# Patient Record
Sex: Male | Born: 1970
Health system: Southern US, Community
[De-identification: ages and names within clinical notes are randomized; demographics above are authoritative.]

## PROBLEM LIST (undated history)

## (undated) DIAGNOSIS — I10 Essential (primary) hypertension: Secondary | ICD-10-CM

## (undated) DIAGNOSIS — R7303 Prediabetes: Secondary | ICD-10-CM

## (undated) DIAGNOSIS — E785 Hyperlipidemia, unspecified: Secondary | ICD-10-CM

## (undated) DIAGNOSIS — E8881 Metabolic syndrome: Secondary | ICD-10-CM

## (undated) DIAGNOSIS — E291 Testicular hypofunction: Secondary | ICD-10-CM

## (undated) DIAGNOSIS — D751 Secondary polycythemia: Secondary | ICD-10-CM

## (undated) DIAGNOSIS — E88819 Insulin resistance, unspecified: Secondary | ICD-10-CM

## (undated) HISTORY — DX: Essential (primary) hypertension: I10

## (undated) HISTORY — DX: Metabolic syndrome: E88.81

## (undated) HISTORY — DX: Prediabetes: R73.03

## (undated) HISTORY — DX: Testicular hypofunction: E29.1

## (undated) HISTORY — DX: Hyperlipidemia, unspecified: E78.5

## (undated) HISTORY — DX: Insulin resistance, unspecified: E88.819

## (undated) HISTORY — PX: SPLENECTOMY: SUR1306

---
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
## 2005-03-22 ENCOUNTER — Emergency Department (HOSPITAL_COMMUNITY): Admission: EM | Admit: 2005-03-22 | Discharge: 2005-03-23 | Payer: Self-pay | Admitting: Emergency Medicine
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
## 2013-04-25 ENCOUNTER — Encounter: Payer: Self-pay | Admitting: Internal Medicine

## 2013-04-25 ENCOUNTER — Ambulatory Visit (INDEPENDENT_AMBULATORY_CARE_PROVIDER_SITE_OTHER): Payer: BC Managed Care – PPO | Admitting: Internal Medicine

## 2013-04-25 VITALS — BP 110/72 | HR 88 | Temp 98.1°F | Resp 18 | Wt 225.8 lb

## 2013-04-25 DIAGNOSIS — R7309 Other abnormal glucose: Secondary | ICD-10-CM

## 2013-04-25 DIAGNOSIS — L719 Rosacea, unspecified: Secondary | ICD-10-CM

## 2013-04-25 DIAGNOSIS — E782 Mixed hyperlipidemia: Secondary | ICD-10-CM

## 2013-04-25 DIAGNOSIS — N529 Male erectile dysfunction, unspecified: Secondary | ICD-10-CM

## 2013-04-25 DIAGNOSIS — E8881 Metabolic syndrome: Secondary | ICD-10-CM

## 2013-04-25 DIAGNOSIS — I10 Essential (primary) hypertension: Secondary | ICD-10-CM

## 2013-04-25 DIAGNOSIS — E1169 Type 2 diabetes mellitus with other specified complication: Secondary | ICD-10-CM | POA: Insufficient documentation

## 2013-04-25 MED ORDER — DOXYCYCLINE HYCLATE 100 MG PO CAPS: 100.0000 mg | ORAL_CAPSULE | Freq: Two times a day (BID) | ORAL | Status: DC

## 2013-04-25 MED ORDER — METRONIDAZOLE 0.75 % EX GEL: 1.0000 "application " | Freq: Two times a day (BID) | CUTANEOUS | Status: AC

## 2013-04-25 NOTE — Progress Notes (Signed)
   Subjective:    Patient ID: Noah Dennis, male    DOB: [birth year], 42 y.o.   MRN: [id number]  HPI Pt c/o dry skin on face which he attributes to his testosterone therapy.     Review of Systems  Constitutional: Negative.   HENT: Negative.   Eyes: Negative.   Respiratory: Negative.   Cardiovascular: Negative.   Gastrointestinal: Negative.   Endocrine: Negative.   Genitourinary: Negative.   Musculoskeletal: Negative.   Skin: Positive for rash.       C/o rash over forehead, cheeks, chin, and neck.  Allergic/Immunologic: Negative.   Neurological: Negative.      Medication List       This list is accurate as of: 04/25/13  7:31 PM.  Always use your most recent med list.               doxycycline 100 MG capsule  Commonly known as:  VIBRAMYCIN  Take 1 capsule (100 mg total) by mouth 2 (two) times daily. With food for Rosacea     finasteride 5 MG tablet  Commonly known as:  PROSCAR  Take 5 mg by mouth daily.     lisinopril 20 MG tablet  Commonly known as:  PRINIVIL,ZESTRIL  Take 20 mg by mouth daily.     metroNIDAZOLE 0.75 % gel  Commonly known as:  METROGEL  Apply 1 application topically 2 (two) times daily.     phentermine 37.5 MG capsule  Take 37.5 mg by mouth every morning.     rosuvastatin 40 MG tablet  Commonly known as:  CRESTOR  Take 40 mg by mouth daily. Takes 1/2 tab daily     testosterone cypionate 100 MG/ML injection  Commonly known as:  DEPOTESTOTERONE CYPIONATE  Inject 200 mg into the muscle every 21 ( twenty-one) days. For IM use only     VITAMIN D PO  Take 5,000 mg by mouth 2 (two) times daily.        No Known Allergies      Objective:   Physical Exam  Constitutional: He is oriented to person, place, and time. He appears well-developed and well-nourished.  HENT:  Head: Normocephalic.  Nose: Nose normal.  Mouth/Throat: Oropharynx is clear and moist. No oropharyngeal exudate.  Eyes: EOM are normal. Pupils are equal, round, and reactive  to light.  Neck: Normal range of motion. Neck supple. No JVD present. No thyromegaly present.  Cardiovascular: Normal rate, regular rhythm and normal heart sounds.   Pulmonary/Chest: Effort normal and breath sounds normal. No respiratory distress.  Musculoskeletal: Normal range of motion.  Lymphadenopathy:    He has no cervical adenopathy.  Neurological: He is alert and oriented to person, place, and time. No cranial nerve deficit. Coordination normal.  Skin: There is erythema.  Velvety type erythematous rash over face consistant with rosacea type rash.  Psychiatric: He has a normal mood and affect.          Assessment & Plan:   1. Rosacea  - doxycycline (VIBRAMYCIN) 100 MG capsule; Take 1 capsule (100 mg total) by mouth 2 (two) times daily. With food for Rosacea  Dispense: 60 capsule; Refill: 99 - metroNIDAZOLE (METROGEL) 0.75 % gel; Apply 1 application topically 2 (two) times daily.  Dispense: 45 g; Refill: 99  2. Unspecified essential hypertension  3. Mixed hyperlipidemia  4. Other abnormal glucose

## 2013-05-04 ENCOUNTER — Encounter: Payer: Self-pay | Admitting: Internal Medicine

## 2013-05-07 DIAGNOSIS — E559 Vitamin D deficiency, unspecified: Secondary | ICD-10-CM | POA: Insufficient documentation

## 2013-05-07 NOTE — Patient Instructions (Signed)
Continue diet & medications same as discussed.   Further disposition pending lab results.    Rosacea Rosacea is a long-term (chronic) condition that affects the skin of the face (cheeks, nose, brow, and chin) and sometimes the eyes. Rosacea causes the blood vessels near the surface of the skin to enlarge, resulting in redness. This condition usually begins after age 43. It occurs most often in light-skinned women. Without treatment, rosacea tends to get worse over time. There is no cure for rosacea, but treatment can help control your symptoms. CAUSES  The cause is unknown. It is thought that some people may inherit a tendency to develop rosacea. Certain triggers can make your rosacea worse, including:  Hot baths.  Exercise.  Sunlight.  Very hot or cold temperatures.  Hot or spicy foods and drinks.  Drinking alcohol.  Stress.  Taking blood pressure medicine.  Long-term use of topical steroids on the face. SYMPTOMS   Redness of the face.  Red bumps or pimples on the face.  Red, enlarged nose (rhinophyma).  Blushing easily.  Red lines on the skin.  Irritated or burning feeling in the eyes.  Swollen eyelids. DIAGNOSIS  Your caregiver can usually tell what is wrong by asking about your symptoms and performing a physical exam. TREATMENT  Avoiding triggers is an important part of treatment. You will also need to see a skin specialist (dermatologist) who can develop a treatment plan for you. The goals of treatment are to control your condition and to improve the appearance of your skin. It may take several weeks or months of treatment before you notice an improvement in your skin. Even after your skin improves, you will likely need to continue treatment to prevent your rosacea from coming back. Treatment methods may include:  Using sunscreen or sunblock daily to protect the skin.  Antibiotic medicine, such as metronidazole, applied directly to the skin.  Antibiotics taken  by mouth. This is usually prescribed if you have eye problems from your rosacea.  Laser surgery to improve the appearance of the skin. This surgery can reduce the appearance of red lines on the skin and can remove excess tissue from the nose to reduce its size. HOME CARE INSTRUCTIONS  Avoid things that seem to trigger your flare-ups.  If you are given antibiotics, take them as directed. Finish them even if you start to feel better.  Use a gentle facial cleanser that does not contain alcohol.  You may use a mild facial moisturizer.  Use a sunscreen or sunblock with SPF 30 or greater.  Wear a green-tinted foundation powder to conceal redness, if needed. Choose cosmetics that are noncomedogenic. This means they do not block your pores.  If your eyelids are affected, apply warm compresses to the eyelids. Do this up to 4 times a day or as directed by your caregiver. SEEK MEDICAL CARE IF:  Your skin problems get worse.  You feel depressed.  You lose your appetite.  You have trouble concentrating.  You have problems with your eyes, such as redness or itching. MAKE SURE YOU:  Understand these instructions.  Will watch your condition.  Will get help right away if you are not doing well or get worse. Document Released: 05/21/2004 Document Revised: 10/13/2011 Document Reviewed: 03/24/2011 [facility] Patient Information 2014 [place], [place].  Hypertension As your heart beats, it forces blood through your arteries. This force is your blood pressure. If the pressure is too high, it is called hypertension (HTN) or high blood pressure. HTN is  dangerous because you may have it and not know it. High blood pressure may mean that your heart has to work harder to pump blood. Your arteries may be narrow or stiff. The extra work puts you at risk for heart disease, stroke, and other problems.  Blood pressure consists of two numbers, a higher number over a lower, 110/72, for example. It is stated as  "110 over 72." The ideal is below 120 for the top number (systolic) and under 80 for the bottom (diastolic). Write down your blood pressure today. You should pay close attention to your blood pressure if you have certain conditions such as:  Heart failure.  Prior heart attack.  Diabetes  Chronic kidney disease.  Prior stroke.  Multiple risk factors for heart disease. To see if you have HTN, your blood pressure should be measured while you are seated with your arm held at the level of the heart. It should be measured at least twice. A one-time elevated blood pressure reading (especially in the Emergency Department) does not mean that you need treatment. There may be conditions in which the blood pressure is different between your right and left arms. It is important to see your caregiver soon for a recheck. Most people have essential hypertension which means that there is not a specific cause. This type of high blood pressure may be lowered by changing lifestyle factors such as:  Stress.  Smoking.  Lack of exercise.  Excessive weight.  Drug/tobacco/alcohol use.  Eating less salt. Most people do not have symptoms from high blood pressure until it has caused damage to the body. Effective treatment can often prevent, delay or reduce that damage. TREATMENT  When a cause has been identified, treatment for high blood pressure is directed at the cause. There are a large number of medications to treat HTN. These fall into several categories, and your caregiver will help you select the medicines that are best for you. Medications may have side effects. You should review side effects with your caregiver. If your blood pressure stays high after you have made lifestyle changes or started on medicines,   Your medication(s) may need to be changed.  Other problems may need to be addressed.  Be certain you understand your prescriptions, and know how and when to take your medicine.  Be sure to  follow up with your caregiver within the time frame advised (usually within two weeks) to have your blood pressure rechecked and to review your medications.  If you are taking more than one medicine to lower your blood pressure, make sure you know how and at what times they should be taken. Taking two medicines at the same time can result in blood pressure that is too low. SEEK IMMEDIATE MEDICAL CARE IF:  You develop a severe headache, blurred or changing vision, or confusion.  You have unusual weakness or numbness, or a faint feeling.  You have severe chest or abdominal pain, vomiting, or breathing problems. MAKE SURE YOU:   Understand these instructions.  Will watch your condition.  Will get help right away if you are not doing well or get worse. Document Released: 04/13/2005 Document Revised: 07/06/2011 Document Reviewed: 12/02/2007 [facility] Patient Information 2014 [place], [place].  Insulin Resistance Blood sugar (glucose) levels are controlled by a hormone called insulin. Insulin is made by your pancreas. When your blood glucose goes up, insulin is released into your blood. Insulin is required for your body to function normally. However, your body can become resistant to your  own insulin or to insulin given to treat diabetes. In either case, insulin resistance can lead to serious problems. These problems include:  Type 2 diabetes.  Heart disease.  High blood pressure.  Stroke.  Polycystic ovary syndrome.  Fatty liver. CAUSES  Insulin resistance can develop for many different reasons. It is more likely to happen in people with these conditions or characteristics:  Obesity.  Inactivity.  Pregnancy.  High blood pressure.  Stress.  Steroid use.  Infection or severe illness.  Increased levels of cholesterol and triglycerides. SYMPTOMS  There are no symptoms. You may have symptoms related to the various complications of insulin resistance.  DIAGNOSIS  Several  different things can make your caregiver suspect you have insulin resistance. These include:  High blood glucose (hyperglycemia).  Abnormal cholesterol levels.  High uric acid levels.  Changes related to blood pressure.  Changes related to inflammation. Insulin resistance can be determined with blood tests. An elevated insulin level when you have not eaten might suggest resistance. Other more complicated tests are sometimes necessary. TREATMENT  Lifestyle changes are the most important treatment for insulin resistance.   If you are overweight and you have insulin resistance, you can improve your insulin sensitivity by losing weight.  Moderate exercise for 30 40 minutes, 4 days a week, can improve insulin sensitivity. Some medicines can also help improve your insulin sensitivity. Your caregiver can discuss these with you if they are appropriate.  HOME CARE INSTRUCTIONS   Do not smoke.  Keep your weight at a healthy level.  Get exercise.  If you have diabetes, follow your caregiver's directions.  If you have high blood pressure, follow your caregiver's directions.  Only take prescription medicines for pain, fever, or discomfort as directed by your caregiver. SEEK MEDICAL CARE IF:   You are diabetic and you are having problems keeping your blood glucose levels at target range.  You are having episodes of low blood glucose (hypoglycemia).  You feel you might be having side effects from your medicines.  You have symptoms of an illness that is not improving after 3 4 days.  You have a sore or wound that is not healing.  You notice a change in vision or a new problem with your vision. SEEK IMMEDIATE MEDICAL CARE IF:   Your blood glucose goes below 70, especially if you have confusion, lightheadedness, or other symptoms with it.  Your blood glucose is very high (as advised by your caregiver) twice in a row.  You pass out.  You have chest pain or trouble breathing.  You  have a sudden, severe headache.  You have sudden weakness in one arm or one leg.  You have sudden difficulty speaking or swallowing.  You develop vomiting or diarrhea that is getting worse or not improving after 1 day. Document Released: 06/02/2005 Document Revised: 10/13/2011 Document Reviewed: 09/22/2012 [facility] Patient Information 2014 [place], [place].  Cholesterol Cholesterol is a white, waxy, fat-like protein needed by your body in small amounts. The liver makes all the cholesterol you need. It is carried from the liver by the blood through the blood vessels. Deposits (plaque) may build up on blood vessel walls. This makes the arteries narrower and stiffer. Plaque increases the risk for heart attack and stroke. You cannot feel your cholesterol level even if it is very high. The only way to know is by a blood test to check your lipid (fats) levels. Once you know your cholesterol levels, you should keep a record of the test  results. Work with your caregiver to to keep your levels in the desired range. WHAT THE RESULTS MEAN:  Total cholesterol is a rough measure of all the cholesterol in your blood.  LDL is the so-called bad cholesterol. This is the type that deposits cholesterol in the walls of the arteries. You want this level to be low.  HDL is the good cholesterol because it cleans the arteries and carries the LDL away. You want this level to be high.  Triglycerides are fat that the body can either burn for energy or store. High levels are closely linked to heart disease. DESIRED LEVELS:  Total cholesterol below 200.  LDL below 100 for people at risk, below 70 for very high risk.  HDL above 50 is good, above 60 is best.  Triglycerides below 150. HOW TO LOWER YOUR CHOLESTEROL:  Diet.  Choose fish or white meat chicken and [place], roasted or baked. Limit fatty cuts of red meat, fried foods, and processed meats, such as sausage and lunch meat.  Eat lots of fresh fruits and  vegetables. Choose whole grains, beans, pasta, potatoes and cereals.  Use only small amounts of olive, corn or canola oils. Avoid butter, mayonnaise, shortening or palm kernel oils. Avoid foods with trans-fats.  Use skim/nonfat milk and low-fat/nonfat yogurt and cheeses. Avoid whole milk, cream, ice cream, egg yolks and cheeses. Healthy desserts include angel food cake, ginger snaps, animal crackers, hard candy, popsicles, and low-fat/nonfat frozen yogurt. Avoid pastries, cakes, pies and cookies.  Exercise.  A regular program helps decrease LDL and raises HDL.  Helps with weight control.  Do things that increase your activity level like gardening, walking, or taking the stairs.  Medication.  May be prescribed by your caregiver to help lowering cholesterol and the risk for heart disease.  You may need medicine even if your levels are normal if you have several risk factors. HOME CARE INSTRUCTIONS   Follow your diet and exercise programs as suggested by your caregiver.  Take medications as directed.  Have blood work done when your caregiver feels it is necessary. MAKE SURE YOU:   Understand these instructions.  Will watch your condition.  Will get help right away if you are not doing well or get worse. Document Released: 01/06/2001 Document Revised: 07/06/2011 Document Reviewed: 01/25/2013 [facility] Patient Information 2014 [place], [place].  Vitamin D Deficiency Vitamin D is an important vitamin that your body needs. Having too little of it in your body is called a deficiency. A very bad deficiency can make your bones soft and can cause a condition called rickets.  Vitamin D is important to your body for different reasons, such as:   It helps your body absorb 2 minerals called calcium and phosphorus.  It helps make your bones healthy.  It may prevent some diseases, such as diabetes and multiple sclerosis.  It helps your muscles and heart. You can get vitamin D in several  ways. It is a natural part of some foods. The vitamin is also added to some dairy products and cereals. Some people take vitamin D supplements. Also, your body makes vitamin D when you are in the sun. It changes the sun's rays into a form of the vitamin that your body can use. CAUSES   Not eating enough foods that contain vitamin D.  Not getting enough sunlight.  Having certain digestive system diseases that make it hard to absorb vitamin D. These diseases include Crohn's disease, chronic pancreatitis, and cystic fibrosis.  Having a  surgery in which part of the stomach or small intestine is removed.  Being obese. Fat cells pull vitamin D out of your blood. That means that obese people may not have enough vitamin D left in their blood and in other body tissues.  Having chronic kidney or liver disease. RISK FACTORS Risk factors are things that make you more likely to develop a vitamin D deficiency. They include:  Being older.  Not being able to get outside very much.  Living in a nursing home.  Having had broken bones.  Having weak or thin bones (osteoporosis).  Having a disease or condition that changes how your body absorbs vitamin D.  Having dark skin.  Some medicines such as seizure medicines or steroids.  Being overweight or obese. SYMPTOMS Mild cases of vitamin D deficiency may not have any symptoms. If you have a very bad case, symptoms may include:  Bone pain.  Muscle pain.  Falling often.  Broken bones caused by a minor injury, due to osteoporosis. DIAGNOSIS A blood test is the best way to tell if you have a vitamin D deficiency. TREATMENT Vitamin D deficiency can be treated in different ways. Treatment for vitamin D deficiency depends on what is causing it. Options include:  Taking vitamin D supplements.  Taking a calcium supplement. Your caregiver will suggest what dose is best for you. HOME CARE INSTRUCTIONS  Take any supplements that your caregiver  prescribes. Follow the directions carefully. Take only the suggested amount.  Have your blood tested 2 months after you start taking supplements.  Eat foods that contain vitamin D. Healthy choices include:  Fortified dairy products, cereals, or juices. Fortified means vitamin D has been added to the food. Check the label on the package to be sure.  Fatty fish like salmon or trout.  Eggs.  Oysters.  Do not use a tanning bed.  Keep your weight at a healthy level. Lose weight if you need to.  Keep all follow-up appointments. Your caregiver will need to perform blood tests to make sure your vitamin D deficiency is going away. SEEK MEDICAL CARE IF:  You have any questions about your treatment.  You continue to have symptoms of vitamin D deficiency.  You have nausea or vomiting.  You are constipated.  You feel confused.  You have severe abdominal or back pain. MAKE SURE YOU:  Understand these instructions.  Will watch your condition.  Will get help right away if you are not doing well or get worse. Document Released: 07/06/2011 Document Revised: 08/08/2012 Document Reviewed: 07/06/2011 [facility] Patient Information 2014 [place].

## 2013-05-07 NOTE — Progress Notes (Signed)
Patient ID: Noah Dennis, male   DOB: July 29, 1970, 43 y.o.   MRN: [id number]  Annual Screening Comprehensive Examination  This very nice 43 y.o.  SWM presents for complete physical.  Patient has been followed for HTN,   Prediabetes/Insulin Resistance, Hyperlipidemia, and Vitamin D Deficiency.   HTN predates since 2005. Patient's BP has been controlled at home.Today's BP: 116/70 mmHg. Patient denies any cardiac symptoms as chest pain, palpitations, shortness of breath, dizziness or ankle swelling.   Patient's hyperlipidemia treated since 2007  is controlled with diet and medications. Patient denies myalgias or other medication SE's. Last cholesterol last visit was 134, triglycerides 179, HDL 35 and LDL 63 -in Jan 2014 - all at goal.     Patient has prediabetes/insulin resistance since Jan 2014 with normal A1c 5.0%, but elevated Insulin level 133 (Nl < 28) with last A1c 5.4 % and  insulin 12 - both normal in Oct 2014. Patient denies reactive hypoglycemic symptoms, visual blurring, diabetic polys, or paresthesias. Also he has Obesity with BMI of 33+ and is in trial with phentermine to see if it assists with weight loss.   Patient also expresses concerns regarding his complexion, eg. 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   Finally, patient has history of Vitamin D Deficiency (was 40 in 2010) with last vitamin D 79 in Oct 2014.       Medication List         doxycycline 100 MG capsule  Commonly known as:  VIBRAMYCIN  Take 1 capsule (100 mg total) by mouth 2 (two) times daily. With food for Rosacea     finasteride 5 MG tablet  Commonly known as:  PROSCAR  Take 5 mg by mouth daily.     hydrocortisone 2.5 % lotion  Apply to affected area 2 times daily     lisinopril 20 MG tablet  Commonly known as:  PRINIVIL,ZESTRIL  Take 20 mg by mouth daily.     metroNIDAZOLE 0.75 % gel  Commonly known as:  METROGEL  Apply 1  application topically 2 (two) times daily.     phentermine 37.5 MG capsule  Take 37.5 mg by mouth every morning.     rosuvastatin 40 MG tablet  Commonly known as:  CRESTOR  Take 40 mg by mouth daily. Takes 1/2 tab daily     testosterone cypionate 100 MG/ML injection  Commonly known as:  DEPOTESTOTERONE CYPIONATE  Inject 200 mg into the muscle every 21 ( twenty-one) days. For IM use only     VITAMIN D PO  Take 5,000 mg by mouth 2 (two) times daily.        No Known Allergies  Past Medical History  Diagnosis Date  . Hypertension   . Hyperlipidemia   . Hypogonadism male   . Insulin resistance   . Pre-diabetes     Past Surgical History  Procedure Laterality Date  . Splenectomy      Family History  Problem Relation Age of Onset  . Diabetes Mother   . Hypertension Mother   . Heart disease Mother   . Hypertension Father   . Cancer Father     History   Social History  . Marital Status: Single    Spouse Name: N/A    Number of Children: N/A  . Years of Education: N/A   Occupational History  . Not on file.   Social History Main Topics  . Smoking  status: Never Smoker   . Smokeless tobacco: Not on file  . Alcohol Use: Yes     Comment: occasionally  . Drug Use: No  . Sexual Activity: Not on file   Other Topics Concern  . Not on file   Social History Narrative  . No narrative on file    ROS Constitutional: Denies fever, chills, weight loss/gain, headaches, insomnia, fatigue, night sweats, and change in appetite. Eyes: Denies redness, blurred vision, diplopia, discharge, itchy, watery eyes.  ENT: Denies discharge, congestion, post nasal drip, epistaxis, sore throat, earache, hearing loss, dental pain, Tinnitus, Vertigo, Sinus pain, snoring.  Cardio: Denies chest pain, palpitations, irregular heartbeat, syncope, dyspnea, diaphoresis, orthopnea, PND, claudication, edema Respiratory: denies cough, dyspnea, DOE, pleurisy, hoarseness, laryngitis, wheezing.   Gastrointestinal: Denies dysphagia, heartburn, reflux, water brash, pain, cramps, nausea, vomiting, bloating, diarrhea, constipation, hematemesis, melena, hematochezia, jaundice, hemorrhoids Genitourinary: Denies dysuria, frequency, urgency, nocturia, hesitancy, discharge, hematuria, flank pain Musculoskeletal: Denies arthralgia, myalgia, stiffness, Jt. Swelling, pain, limp, and strain/sprain. Skin: Denies puritis, rash, hives, warts, acne, eczema, changing in skin lesion Neuro: No weakness, tremor, incoordination, spasms, paresthesia, pain Psychiatric: Denies confusion, memory loss, sensory loss Endocrine: Denies change in weight, skin, hair change, nocturia, and paresthesia, diabetic polys, visual blurring, hyper / hypo glycemic episodes.  Heme/Lymph: No excessive bleeding, bruising, or elarged lymph nodes.  Filed Vitals:   05/08/13 1419  BP: 116/70  Pulse: 92  Temp: 98.2 F (36.8 C)  Resp: 16    Estimated body mass index is 33.28 kg/(m^2) as calculated from the following:   Height as of this encounter: 5' 9.25" (1.759 m).   Weight as of this encounter: 227 lb (102.967 kg).  Physical Exam General Appearance: Well nourished, in no apparent distress. Eyes: PERRLA, EOMs, conjunctiva no swelling or erythema, normal fundi and vessels. Sinuses: No frontal/maxillary tenderness ENT/Mouth: EACs patent / TMs  nl. Nares clear without erythema, swelling, mucoid exudates. Oral hygiene is good. No erythema, swelling, or exudate. Tongue normal, non-obstructing. Tonsils not swollen or erythematous. Hearing normal.  Neck: Supple, thyroid normal. No bruits, nodes or JVD. Respiratory: Respiratory effort normal.  BS equal and clear bilateral without rales, rhonci, wheezing or stridor. Cardio: Heart sounds are normal with regular rate and rhythm and no murmurs, rubs or gallops. Peripheral pulses are normal and equal bilaterally without edema. No aortic or femoral bruits. Chest: symmetric with normal  excursions and percussion.  Abdomen: Flat, soft, with bowl sounds. Nontender, no guarding, rebound, hernias, masses, or organomegaly.  Lymphatics: Non tender without lymphadenopathy.  Genitourinary: No hernias.Testes nl. DRE - prostate nl for age - smooth & firm w/o nodules. Musculoskeletal: Full ROM all peripheral extremities, joint stability, 5/5 strength, and normal gait. Skin: Warm and dry without rashes, lesions, cyanosis, clubbing or  ecchymosis. Moderate classic rosacea facial erythema. Neuro: Cranial nerves intact, reflexes equal bilaterally. Normal muscle tone, no cerebellar symptoms. Sensation intact.  Pysch: Awake and oriented X 3, normal affect, insight and judgment appropriate.   Assessment and Plan  1. Annual Screening Examination 2. Hypertension  3. Hyperlipidemia 4. Pre Diabetes/Insulin Resistance 5. Vitamin D Deficiency 6. Obesity 7. Rosacea will continue Doxycycline, Metrogel & give injection of Dexamethasone and Rx Hydrocortisone 2.5% lotion   Continue prudent diet as discussed, weight control, BP monitoring, regular exercise, and medications as discussed.  Discussed med effects and SE's. Routine screening labs and tests as requested with regular follow-up as recommended.

## 2013-05-08 ENCOUNTER — Encounter: Payer: Self-pay | Admitting: Internal Medicine

## 2013-05-08 ENCOUNTER — Ambulatory Visit (INDEPENDENT_AMBULATORY_CARE_PROVIDER_SITE_OTHER): Payer: BC Managed Care – PPO | Admitting: Internal Medicine

## 2013-05-08 VITALS — BP 116/70 | HR 92 | Temp 98.2°F | Resp 16 | Ht 69.25 in | Wt 227.0 lb

## 2013-05-08 DIAGNOSIS — I10 Essential (primary) hypertension: Secondary | ICD-10-CM

## 2013-05-08 DIAGNOSIS — Z113 Encounter for screening for infections with a predominantly sexual mode of transmission: Secondary | ICD-10-CM

## 2013-05-08 DIAGNOSIS — Z125 Encounter for screening for malignant neoplasm of prostate: Secondary | ICD-10-CM

## 2013-05-08 DIAGNOSIS — E559 Vitamin D deficiency, unspecified: Secondary | ICD-10-CM

## 2013-05-08 DIAGNOSIS — L309 Dermatitis, unspecified: Secondary | ICD-10-CM

## 2013-05-08 DIAGNOSIS — Z79899 Other long term (current) drug therapy: Secondary | ICD-10-CM

## 2013-05-08 DIAGNOSIS — Z111 Encounter for screening for respiratory tuberculosis: Secondary | ICD-10-CM

## 2013-05-08 DIAGNOSIS — Z1212 Encounter for screening for malignant neoplasm of rectum: Secondary | ICD-10-CM

## 2013-05-08 DIAGNOSIS — R740 Nonspecific elevation of levels of transaminase and lactic acid dehydrogenase [LDH]: Secondary | ICD-10-CM

## 2013-05-08 DIAGNOSIS — R7402 Elevation of levels of lactic acid dehydrogenase (LDH): Secondary | ICD-10-CM

## 2013-05-08 DIAGNOSIS — Z0000 Encounter for general adult medical examination without abnormal findings: Secondary | ICD-10-CM

## 2013-05-08 DIAGNOSIS — R7401 Elevation of levels of liver transaminase levels: Secondary | ICD-10-CM

## 2013-05-08 DIAGNOSIS — E782 Mixed hyperlipidemia: Secondary | ICD-10-CM

## 2013-05-08 LAB — TSH: TSH: 2.662 u[IU]/mL (ref 0.350–4.500)

## 2013-05-08 LAB — BASIC METABOLIC PANEL WITH GFR
BUN: 10 mg/dL (ref 6–23)
CO2: 28 mEq/L (ref 19–32)
Calcium: 9.5 mg/dL (ref 8.4–10.5)
Chloride: 100 mEq/L (ref 96–112)
Creat: 1.11 mg/dL (ref 0.50–1.35)
GFR, Est African American: >89 mL/min
GFR, Est Non African American: 81 mL/min
Glucose, Bld: 86 mg/dL (ref 70–99)
Potassium: 4.3 mEq/L (ref 3.5–5.3)
Sodium: 137 mEq/L (ref 135–145)

## 2013-05-08 LAB — CBC WITH DIFFERENTIAL/PLATELET
Basophils Absolute: 0.1 10*3/uL (ref 0.0–0.1)
Basophils Relative: 1 % (ref 0–1)
Eosinophils Absolute: 0.5 10*3/uL (ref 0.0–0.7)
Eosinophils Relative: 5 % (ref 0–5)
HCT: 47.2 % (ref 39.0–52.0)
Hemoglobin: 16.9 g/dL (ref 13.0–17.0)
Lymphocytes Relative: 22 % (ref 12–46)
Lymphs Abs: 2.6 10*3/uL (ref 0.7–4.0)
MCH: 32.6 pg (ref 26.0–34.0)
MCHC: 35.8 g/dL (ref 30.0–36.0)
MCV: 90.9 fL (ref 78.0–100.0)
Monocytes Absolute: 1.8 10*3/uL — ABNORMAL HIGH (ref 0.1–1.0)
Monocytes Relative: 15 % — ABNORMAL HIGH (ref 3–12)
Neutro Abs: 6.9 10*3/uL (ref 1.7–7.7)
Neutrophils Relative %: 57 % (ref 43–77)
Platelets: 479 10*3/uL — ABNORMAL HIGH (ref 150–400)
RBC: 5.19 MIL/uL (ref 4.22–5.81)
RDW: 13.2 % (ref 11.5–15.5)
WBC: 11.9 10*3/uL — ABNORMAL HIGH (ref 4.0–10.5)

## 2013-05-08 LAB — LIPID PANEL
Cholesterol: 129 mg/dL (ref 0–200)
HDL: 35 mg/dL — ABNORMAL LOW (ref >39)
LDL Cholesterol: 46 mg/dL (ref 0–99)
Total CHOL/HDL Ratio: 3.7 Ratio
Triglycerides: 238 mg/dL — ABNORMAL HIGH (ref <150)
VLDL: 48 mg/dL — ABNORMAL HIGH (ref 0–40)

## 2013-05-08 LAB — HEPATIC FUNCTION PANEL
ALT: 30 U/L (ref 0–53)
AST: 26 U/L (ref 0–37)
Albumin: 4.2 g/dL (ref 3.5–5.2)
Alkaline Phosphatase: 60 U/L (ref 39–117)
Bilirubin, Direct: 0.1 mg/dL (ref 0.0–0.3)
Indirect Bilirubin: 0.6 mg/dL (ref 0.0–0.9)
Total Bilirubin: 0.7 mg/dL (ref 0.3–1.2)
Total Protein: 7.1 g/dL (ref 6.0–8.3)

## 2013-05-08 LAB — VITAMIN B12: Vitamin B-12: 698 pg/mL (ref 211–911)

## 2013-05-08 LAB — HEMOGLOBIN A1C
Hgb A1c MFr Bld: 5.4 % (ref <5.7)
Mean Plasma Glucose: 108 mg/dL (ref <117)

## 2013-05-08 LAB — MAGNESIUM: Magnesium: 1.6 mg/dL (ref 1.5–2.5)

## 2013-05-08 MED ORDER — HYDROCORTISONE 2.5 % EX LOTN: TOPICAL_LOTION | CUTANEOUS | Status: DC

## 2013-05-08 MED ORDER — DEXAMETHASONE SODIUM PHOSPHATE 100 MG/10ML IJ SOLN
10.0000 mg | Freq: Once | INTRAMUSCULAR | Status: AC
  Administered 2013-05-08: 10 mg via INTRAMUSCULAR

## 2013-05-09 LAB — VITAMIN D 25 HYDROXY (VIT D DEFICIENCY, FRACTURES): Vit D, 25-Hydroxy: 83 ng/mL (ref 30–89)

## 2013-05-09 LAB — HEPATITIS C ANTIBODY: HCV Ab: NEGATIVE

## 2013-05-09 LAB — INSULIN, FASTING: Insulin fasting, serum: 50 u[IU]/mL — ABNORMAL HIGH (ref 3–28)

## 2013-05-09 LAB — RPR

## 2013-05-09 LAB — URINALYSIS, MICROSCOPIC ONLY
Bacteria, UA: NONE SEEN
Casts: NONE SEEN
Squamous Epithelial / HPF: NONE SEEN

## 2013-05-09 LAB — HIV ANTIBODY (ROUTINE TESTING W REFLEX): HIV: NONREACTIVE

## 2013-05-09 LAB — HEPATITIS A ANTIBODY, TOTAL: Hep A Total Ab: NONREACTIVE

## 2013-05-09 LAB — PSA: PSA: 0.39 ng/mL (ref <=4.00)

## 2013-05-09 LAB — MICROALBUMIN / CREATININE URINE RATIO
Creatinine, Urine: 229.3 mg/dL
Microalb Creat Ratio: 3.4 mg/g (ref 0.0–30.0)
Microalb, Ur: 0.78 mg/dL (ref 0.00–1.89)

## 2013-05-09 LAB — HEPATITIS B SURFACE ANTIBODY,QUALITATIVE: Hep B S Ab: NEGATIVE

## 2013-05-09 LAB — TESTOSTERONE: Testosterone: 239 ng/dL — ABNORMAL LOW (ref 300–890)

## 2013-05-09 LAB — HEPATITIS B CORE ANTIBODY, TOTAL: Hep B Core Total Ab: NONREACTIVE

## 2013-05-10 ENCOUNTER — Other Ambulatory Visit: Payer: Self-pay | Admitting: Internal Medicine

## 2013-05-10 DIAGNOSIS — J0410 Acute tracheitis without obstruction: Secondary | ICD-10-CM

## 2013-05-10 MED ORDER — PREDNISONE 20 MG PO TABS: ORAL_TABLET | ORAL | Status: DC

## 2013-05-10 MED ORDER — AZITHROMYCIN 250 MG PO TABS: ORAL_TABLET | ORAL | Status: AC

## 2013-05-11 LAB — TB SKIN TEST
Induration: 0 mm
TB Skin Test: NEGATIVE

## 2013-05-11 LAB — HEPATITIS B E ANTIBODY: Hepatitis Be Antibody: NEGATIVE

## 2013-05-13 ENCOUNTER — Other Ambulatory Visit: Payer: Self-pay | Admitting: Internal Medicine

## 2013-05-21 ENCOUNTER — Other Ambulatory Visit: Payer: Self-pay | Admitting: Internal Medicine

## 2013-06-22 ENCOUNTER — Other Ambulatory Visit: Payer: Self-pay | Admitting: Emergency Medicine

## 2013-07-21 ENCOUNTER — Other Ambulatory Visit: Payer: Self-pay | Admitting: Internal Medicine

## 2013-08-07 ENCOUNTER — Encounter: Payer: Self-pay | Admitting: Emergency Medicine

## 2013-08-07 ENCOUNTER — Ambulatory Visit (INDEPENDENT_AMBULATORY_CARE_PROVIDER_SITE_OTHER): Payer: BC Managed Care – PPO | Admitting: Emergency Medicine

## 2013-08-07 VITALS — BP 134/88 | HR 80 | Temp 98.4°F | Resp 18 | Ht 69.5 in | Wt 228.0 lb

## 2013-08-07 DIAGNOSIS — E782 Mixed hyperlipidemia: Secondary | ICD-10-CM

## 2013-08-07 DIAGNOSIS — E291 Testicular hypofunction: Secondary | ICD-10-CM

## 2013-08-07 DIAGNOSIS — R7309 Other abnormal glucose: Secondary | ICD-10-CM

## 2013-08-07 DIAGNOSIS — I10 Essential (primary) hypertension: Secondary | ICD-10-CM

## 2013-08-07 DIAGNOSIS — E663 Overweight: Secondary | ICD-10-CM

## 2013-08-07 LAB — CBC WITH DIFFERENTIAL/PLATELET
Basophils Absolute: 0.1 10*3/uL (ref 0.0–0.1)
Basophils Relative: 1 % (ref 0–1)
Eosinophils Absolute: 0.3 10*3/uL (ref 0.0–0.7)
Eosinophils Relative: 2 % (ref 0–5)
HCT: 46.5 % (ref 39.0–52.0)
Hemoglobin: 16.9 g/dL (ref 13.0–17.0)
Lymphocytes Relative: 24 % (ref 12–46)
Lymphs Abs: 3.2 10*3/uL (ref 0.7–4.0)
MCH: 33.7 pg (ref 26.0–34.0)
MCHC: 36.3 g/dL — ABNORMAL HIGH (ref 30.0–36.0)
MCV: 92.8 fL (ref 78.0–100.0)
Monocytes Absolute: 1.5 10*3/uL — ABNORMAL HIGH (ref 0.1–1.0)
Monocytes Relative: 11 % (ref 3–12)
Neutro Abs: 8.4 10*3/uL — ABNORMAL HIGH (ref 1.7–7.7)
Neutrophils Relative %: 62 % (ref 43–77)
Platelets: 442 10*3/uL — ABNORMAL HIGH (ref 150–400)
RBC: 5.01 MIL/uL (ref 4.22–5.81)
RDW: 13.8 % (ref 11.5–15.5)
WBC: 13.5 10*3/uL — ABNORMAL HIGH (ref 4.0–10.5)

## 2013-08-07 LAB — HEMOGLOBIN A1C
Hgb A1c MFr Bld: 5.2 % (ref <5.7)
Mean Plasma Glucose: 103 mg/dL (ref <117)

## 2013-08-07 MED ORDER — TESTOSTERONE 40.5 MG/2.5GM (1.62%) TD GEL: TRANSDERMAL | Status: DC

## 2013-08-07 MED ORDER — PHENTERMINE HCL 37.5 MG PO TABS
37.5000 mg | ORAL_TABLET | Freq: Every day | ORAL | Status: DC
Start: 2013-08-07 — End: 2013-09-04

## 2013-08-07 NOTE — Patient Instructions (Signed)
We want weight loss that will last so you should lose 1-2 pounds a week.  THAT IS IT! Please pick THREE things a month to change. Once it is a habit check off the item. Then pick another three items off the list to become habits.  If you are already doing a habit on the list GREAT!  Cross that item off! o Don't drink your calories. Ie, alcohol, soda, fruit juice, and sweet tea.  o Drink more water. Drink a glass when you feel hungry or before each meal.  o Eat breakfast - Complex carb and protein (likeDannon light and fit yogurt, oatmeal, fruit, eggs, turkey bacon). o Measure your cereal.  Eat no more than one cup a day. (ie Kashi) o Eat an apple a day. o Add a vegetable a day. o Try a new vegetable a month. o Use Pam! Stop using oil or butter to cook. o Don't finish your plate or use smaller plates. o Share your dessert. o Eat sugar free Jello for dessert or frozen grapes. o Don't eat 2-3 hours before bed. o Switch to whole wheat bread, pasta, and brown rice. o Make healthier choices when you eat out. No fries! o Pick baked chicken, NOT fried. o Don't forget to SLOW DOWN when you eat. It is not going anywhere.  o Take the stairs. o Park far away in the parking lot o Lift soup cans (or weights) for 10 minutes while watching TV. o Walk at work for 10 minutes during break. o Walk outside 1 time a week with your friend, kids, dog, or significant other. o Start a walking group at church. o Walk the mall as much as you can tolerate.  o Keep a food diary. o Weigh yourself daily. o Walk for 15 minutes 3 days per week. o Cook at home more often and eat out less.  If life happens and you go back to old habits, it is okay.  Just start over. You can do it!   If you experience chest pain, get short of breath, or tired during the exercise, please stop immediately and inform your doctor.     Bad carbs also include fruit juice, alcohol, and sweet tea. These are empty calories that do not signal to  your brain that you are full.   Please remember the good carbs are still carbs which convert into sugar. So please measure them out no more than 1/2-1 cup of rice, oatmeal, pasta, and beans.  Veggies are however free foods! Pile them on.   I like lean protein at every meal such as chicken, turkey, pork chops, cottage cheese, etc. Just do not fry these meats and please center your meal around vegetable, the meats should be a side dish.   No all fruit is created equal. Please see the list below, the fruit at the bottom is higher in sugars than the fruit at the top    

## 2013-08-07 NOTE — Progress Notes (Signed)
Subjective:    Patient ID: Noah Dennis, male    DOB: 08/22/1970, 43 y.o.   MRN: [id number]  HPI Comments: 43 yo male presents for 3 month F/U for HTN, Cholesterol, Pre-Dm, D. Deficient. He is not checking BP.  He wants to switch from testosterone injections to Androgel. He fills the injections are not keeping his energy level stable. He has not exercising due to work load. He is going to start eating healthy. He had 3- 4 RX of Phentermine filled in past and wants refill of medication. He has not lost or gained any weight since last OV. His last labs WNL except Testosterone low and insulin level elevated.   Hyperlipidemia  Hypertension     Medication List       This list is accurate as of: 08/07/13  2:03 PM.  Always use your most recent med list.               doxycycline 100 MG capsule  Commonly known as:  VIBRAMYCIN  Take 1 capsule (100 mg total) by mouth 2 (two) times daily. With food for Rosacea     finasteride 5 MG tablet  Commonly known as:  PROSCAR  Take 5 mg by mouth daily.     hydrocortisone 2.5 % lotion  Apply to affected area 2 times daily     lisinopril 20 MG tablet  Commonly known as:  PRINIVIL,ZESTRIL  Take one tablet by mouth one time daily     metroNIDAZOLE 0.75 % gel  Commonly known as:  METROGEL  Apply 1 application topically 2 (two) times daily.     phentermine 37.5 MG capsule  Take 37.5 mg by mouth every morning.     rosuvastatin 40 MG tablet  Commonly known as:  CRESTOR  Take 40 mg by mouth daily. Takes 1/2 tab daily     testosterone cypionate 100 MG/ML injection  Commonly known as:  DEPOTESTOTERONE CYPIONATE  Inject 200 mg into the muscle every 21 ( twenty-one) days. For IM use only     VITAMIN D PO  Take 5,000 mg by mouth 2 (two) times daily.      No Known Allergies   Past Medical History  Diagnosis Date  . Hypertension   . Hyperlipidemia   . Hypogonadism male   . Insulin resistance   . Pre-diabetes         Review of Systems   Constitutional: Positive for fatigue.  All other systems reviewed and are negative.  BP 134/88  Pulse 80  Temp(Src) 98.4 F (36.9 C) (Temporal)  Resp 18  Ht 5' 9.5" (1.765 m)  Wt 228 lb (103.42 kg)  BMI 33.20 kg/m2     Objective:   Physical Exam  Nursing note and vitals reviewed. Constitutional: He is oriented to person, place, and time. He appears well-developed and well-nourished.  overweight  HENT:  Head: Normocephalic and atraumatic.  Right Ear: External ear normal.  Left Ear: External ear normal.  Nose: Nose normal.  Eyes: Conjunctivae and EOM are normal.  Neck: Normal range of motion. Neck supple. No JVD present. No thyromegaly present.  Cardiovascular: Normal rate, regular rhythm, normal heart sounds and intact distal pulses.   Pulmonary/Chest: Effort normal and breath sounds normal.  Abdominal: Soft. Bowel sounds are normal. He exhibits no distension and no mass. There is no tenderness. There is no rebound and no guarding.  Musculoskeletal: Normal range of motion. He exhibits no edema and no tenderness.  Lymphadenopathy:    He  has no cervical adenopathy.  Neurological: He is alert and oriented to person, place, and time. He has normal reflexes. No cranial nerve deficit. Coordination normal.  Skin: Skin is warm and dry.  Psychiatric: He has a normal mood and affect. His behavior is normal. Judgment and thought content normal.          Assessment & Plan:  1.  3 month F/U for HTN, Cholesterol, Pre-Dm, D. Deficient. Needs healthy diet, cardio QD and obtain healthy weight. Check Labs, Check BP if >130/80 call office  2. Over weight- Phentermine 37.5 1 po qd  #5 RX of 6, advised to try 1/2 tabs

## 2013-08-08 ENCOUNTER — Other Ambulatory Visit: Payer: Self-pay | Admitting: Emergency Medicine

## 2013-08-08 LAB — INSULIN, FASTING: Insulin fasting, serum: 8 u[IU]/mL (ref 3–28)

## 2013-08-08 LAB — BASIC METABOLIC PANEL WITH GFR
BUN: 8 mg/dL (ref 6–23)
CO2: 24 mEq/L (ref 19–32)
Calcium: 10 mg/dL (ref 8.4–10.5)
Chloride: 100 mEq/L (ref 96–112)
Creat: 0.93 mg/dL (ref 0.50–1.35)
GFR, Est African American: >89 mL/min
GFR, Est Non African American: >89 mL/min
Glucose, Bld: 91 mg/dL (ref 70–99)
Potassium: 4.3 mEq/L (ref 3.5–5.3)
Sodium: 136 mEq/L (ref 135–145)

## 2013-08-08 LAB — TESTOSTERONE: Testosterone: 1249 ng/dL — ABNORMAL HIGH (ref 300–890)

## 2013-08-08 LAB — LIPID PANEL
Cholesterol: 110 mg/dL (ref 0–200)
HDL: 43 mg/dL (ref >39)
LDL Cholesterol: 48 mg/dL (ref 0–99)
Total CHOL/HDL Ratio: 2.6 Ratio
Triglycerides: 94 mg/dL (ref <150)
VLDL: 19 mg/dL (ref 0–40)

## 2013-08-08 LAB — HEPATIC FUNCTION PANEL
ALT: 27 U/L (ref 0–53)
AST: 19 U/L (ref 0–37)
Albumin: 4.6 g/dL (ref 3.5–5.2)
Alkaline Phosphatase: 47 U/L (ref 39–117)
Bilirubin, Direct: 0.3 mg/dL (ref 0.0–0.3)
Indirect Bilirubin: 1.1 mg/dL (ref 0.2–1.2)
Total Bilirubin: 1.4 mg/dL — ABNORMAL HIGH (ref 0.2–1.2)
Total Protein: 7 g/dL (ref 6.0–8.3)

## 2013-08-08 MED ORDER — LEVOFLOXACIN 500 MG PO TABS: 500.0000 mg | ORAL_TABLET | Freq: Every day | ORAL | Status: AC

## 2013-08-15 ENCOUNTER — Other Ambulatory Visit: Payer: Self-pay | Admitting: Emergency Medicine

## 2013-09-04 ENCOUNTER — Other Ambulatory Visit: Payer: Self-pay | Admitting: Emergency Medicine

## 2013-09-06 ENCOUNTER — Other Ambulatory Visit (INDEPENDENT_AMBULATORY_CARE_PROVIDER_SITE_OTHER): Payer: BC Managed Care – PPO

## 2013-09-06 DIAGNOSIS — N529 Male erectile dysfunction, unspecified: Secondary | ICD-10-CM

## 2013-09-06 DIAGNOSIS — R7989 Other specified abnormal findings of blood chemistry: Secondary | ICD-10-CM

## 2013-09-06 LAB — CBC WITH DIFFERENTIAL/PLATELET
Basophils Absolute: 0.1 10*3/uL (ref 0.0–0.1)
Basophils Relative: 1 % (ref 0–1)
Eosinophils Absolute: 0.4 10*3/uL (ref 0.0–0.7)
Eosinophils Relative: 4 % (ref 0–5)
HCT: 45.1 % (ref 39.0–52.0)
Hemoglobin: 15.9 g/dL (ref 13.0–17.0)
Lymphocytes Relative: 24 % (ref 12–46)
Lymphs Abs: 2.1 10*3/uL (ref 0.7–4.0)
MCH: 32.8 pg (ref 26.0–34.0)
MCHC: 35.3 g/dL (ref 30.0–36.0)
MCV: 93 fL (ref 78.0–100.0)
Monocytes Absolute: 0.7 10*3/uL (ref 0.1–1.0)
Monocytes Relative: 8 % (ref 3–12)
Neutro Abs: 5.5 10*3/uL (ref 1.7–7.7)
Neutrophils Relative %: 63 % (ref 43–77)
Platelets: 478 10*3/uL — ABNORMAL HIGH (ref 150–400)
RBC: 4.85 MIL/uL (ref 4.22–5.81)
RDW: 12.8 % (ref 11.5–15.5)
WBC: 8.8 10*3/uL (ref 4.0–10.5)

## 2013-09-06 LAB — TESTOSTERONE: Testosterone: 220 ng/dL — ABNORMAL LOW (ref 300–890)

## 2013-10-08 ENCOUNTER — Other Ambulatory Visit: Payer: Self-pay | Admitting: Physician Assistant

## 2013-11-06 ENCOUNTER — Other Ambulatory Visit: Payer: Self-pay | Admitting: Physician Assistant

## 2013-11-08 ENCOUNTER — Encounter: Payer: Self-pay | Admitting: Internal Medicine

## 2013-11-08 ENCOUNTER — Ambulatory Visit (INDEPENDENT_AMBULATORY_CARE_PROVIDER_SITE_OTHER): Payer: BC Managed Care – PPO | Admitting: Internal Medicine

## 2013-11-08 VITALS — BP 118/68 | HR 84 | Temp 98.2°F | Resp 16 | Ht 69.5 in | Wt 232.6 lb

## 2013-11-08 DIAGNOSIS — E559 Vitamin D deficiency, unspecified: Secondary | ICD-10-CM

## 2013-11-08 DIAGNOSIS — R7309 Other abnormal glucose: Secondary | ICD-10-CM

## 2013-11-08 DIAGNOSIS — E291 Testicular hypofunction: Secondary | ICD-10-CM

## 2013-11-08 DIAGNOSIS — Z79899 Other long term (current) drug therapy: Secondary | ICD-10-CM | POA: Insufficient documentation

## 2013-11-08 DIAGNOSIS — E8881 Metabolic syndrome: Secondary | ICD-10-CM

## 2013-11-08 DIAGNOSIS — E782 Mixed hyperlipidemia: Secondary | ICD-10-CM

## 2013-11-08 DIAGNOSIS — I10 Essential (primary) hypertension: Secondary | ICD-10-CM

## 2013-11-08 LAB — CBC WITH DIFFERENTIAL/PLATELET
Basophils Absolute: 0.1 10*3/uL (ref 0.0–0.1)
Basophils Relative: 1 % (ref 0–1)
Eosinophils Absolute: 0.7 10*3/uL (ref 0.0–0.7)
Eosinophils Relative: 6 % — ABNORMAL HIGH (ref 0–5)
HCT: 43.5 % (ref 39.0–52.0)
Hemoglobin: 16.1 g/dL (ref 13.0–17.0)
Lymphocytes Relative: 27 % (ref 12–46)
Lymphs Abs: 3.3 10*3/uL (ref 0.7–4.0)
MCH: 33.1 pg (ref 26.0–34.0)
MCHC: 37 g/dL — ABNORMAL HIGH (ref 30.0–36.0)
MCV: 89.3 fL (ref 78.0–100.0)
Monocytes Absolute: 1 10*3/uL (ref 0.1–1.0)
Monocytes Relative: 8 % (ref 3–12)
Neutro Abs: 7.1 10*3/uL (ref 1.7–7.7)
Neutrophils Relative %: 58 % (ref 43–77)
Platelets: 476 10*3/uL — ABNORMAL HIGH (ref 150–400)
RBC: 4.87 MIL/uL (ref 4.22–5.81)
RDW: 13 % (ref 11.5–15.5)
WBC: 12.3 10*3/uL — ABNORMAL HIGH (ref 4.0–10.5)

## 2013-11-08 LAB — HEMOGLOBIN A1C
Hgb A1c MFr Bld: 5.4 % (ref <5.7)
Mean Plasma Glucose: 108 mg/dL (ref <117)

## 2013-11-08 MED ORDER — TESTOSTERONE 40.5 MG/2.5GM (1.62%) TD GEL: TRANSDERMAL | Status: DC

## 2013-11-08 NOTE — Patient Instructions (Signed)

## 2013-11-08 NOTE — Progress Notes (Signed)
Patient ID: Noah Dennis, male   DOB: [birth year], 43 y.o.   MRN: [id number]   This very nice 43 y.o.SWM presents for 3 month follow up with Hypertension, Hyperlipidemia, Pre-Diabetes, Testosterone and Vitamin D Deficiency.    HTN predates since 2005. BP has been controlled at home. Today's BP: 118/68 mmHg. Patient denies any cardiac type chest pain, palpitations, dyspnea/orthopnea/PND, dizziness, claudication, or dependent edema.   Hyperlipidemia is controlled with diet & meds. Patient denies myalgias or other med SE's. Last Lipids were  At goal as below. Lab Results  Component Value Date   CHOL 110 08/07/2013   HDL 43 08/07/2013   LDLCALC 48 08/07/2013   TRIG 94 08/07/2013   CHOLHDL 2.6 08/07/2013    Also, the patient has history of PreDiabetes and insulin resistance since July 2011 with normal A1c 4.8%, but elevated insulin 77 and last A1c was  5.2% in Apr 2015. Patient denies any symptoms of reactive hypoglycemia, diabetic polys, paresthesias or visual blurring.   Patient is on Testosterone Replacement with improved sense of energy and well-being. Further, Patient has history of Vitamin D Deficiency   and last vitamin D was 43 in Jan 2015. Patient supplements vitamin D without any suspected side-effects.   Medication List   doxycycline 100 MG capsule  Commonly known as:  VIBRAMYCIN  Take 1 capsule (100 mg total) by mouth 2 (two) times daily. With food for Rosacea     finasteride 5 MG tablet  Commonly known as:  PROSCAR  Take 5 mg by mouth daily.     hydrocortisone 2.5 % lotion  Apply to affected area 2 times daily     lisinopril 20 MG tablet  Commonly known as:  PRINIVIL,ZESTRIL  Take one tablet by mouth one time daily     metroNIDAZOLE 0.75 % gel  Commonly known as:  METROGEL  Apply 1 application topically 2 (two) times daily.     phentermine 37.5 MG tablet  Commonly known as:  ADIPEX-P  TAKE ONE TABLET BY MOUTH EVERY MORNING     rosuvastatin 40 MG tablet  Commonly known as:   CRESTOR  Take 40 mg by mouth daily. Takes 1/2 tab daily     Testosterone 40.5 MG/2.5GM (1.62%) Gel  Commonly known as:  ANDROGEL  Dispense 1 bottle - Apply 2 pumps daily - refill x 6 months     VITAMIN D PO  Take 5,000 mg by mouth 2 (two) times daily.       No Known Allergies  PMHx:   Past Medical History  Diagnosis Date  . Hypertension   . Hyperlipidemia   . Hypogonadism male   . Insulin resistance   . Pre-diabetes    FHx:    Reviewed / unchanged  SHx:    Reviewed / unchanged  Systems Review:  Constitutional: Denies fever, chills, wt changes, headaches, insomnia, fatigue, night sweats, change in appetite. Eyes: Denies redness, blurred vision, diplopia, discharge, itchy, watery eyes.  ENT: Denies discharge, congestion, post nasal drip, epistaxis, sore throat, earache, hearing loss, dental pain, tinnitus, vertigo, sinus pain, snoring.  CV: Denies chest pain, palpitations, irregular heartbeat, syncope, dyspnea, diaphoresis, orthopnea, PND, claudication or edema. Respiratory: denies cough, dyspnea, DOE, pleurisy, hoarseness, laryngitis, wheezing.  Gastrointestinal: Denies dysphagia, odynophagia, heartburn, reflux, water brash, abdominal pain or cramps, nausea, vomiting, bloating, diarrhea, constipation, hematemesis, melena, hematochezia  or hemorrhoids. Genitourinary: Denies dysuria, frequency, urgency, nocturia, hesitancy, discharge, hematuria or flank pain. Musculoskeletal: Denies arthralgias, myalgias, stiffness, jt. swelling, pain, limping or strain/sprain.  Skin: Denies pruritus, rash, hives, warts, acne, eczema or change in skin lesion(s). Neuro: No weakness, tremor, incoordination, spasms, paresthesia or pain. Psychiatric: Denies confusion, memory loss or sensory loss. Endo: Denies change in weight, skin or hair change.  Heme/Lymph: No excessive bleeding, bruising or enlarged lymph nodes.  Exam:  BP 118/68  Pulse 84  Temp(Src) 98.2 F (36.8 C) (Temporal)  Resp 16   Ht 5' 9.5" (1.765 m)  Wt 232 lb 9.6 oz (105.507 kg)  BMI 33.87 kg/m2  Appears well nourished and in no distress. Eyes: PERRLA, EOMs, conjunctiva no swelling or erythema. Sinuses: No frontal/maxillary tenderness ENT/Mouth: EAC's clear, TM's nl w/o erythema, bulging. Nares clear w/o erythema, swelling, exudates. Oropharynx clear without erythema or exudates. Oral hygiene is good. Tongue normal, non obstructing. Hearing intact.  Neck: Supple. Thyroid nl. Car 2+/2+ without bruits, nodes or JVD. Chest: Respirations nl with BS clear & equal w/o rales, rhonchi, wheezing or stridor.  Cor: Heart sounds normal w/ regular rate and rhythm without sig. murmurs, gallops, clicks, or rubs. Peripheral pulses normal and equal  without edema.  Lymphatics: Unremarkable.  Musculoskeletal: Full ROM all peripheral extremities, joint stability, 5/5 strength, and normal gait.  Skin: Warm, dry without exposed rashes, lesions or ecchymosis apparent.  Neuro: Cranial nerves intact, reflexes equal bilaterally. Sensory-motor testing grossly intact. Tendon reflexes grossly intact.  Pysch: Alert & oriented x 3. Insight and judgement nl & appropriate. No ideations.  Assessment and Plan:  1. Hypertension - Continue monitor blood pressure at home. Continue diet/meds same.  2. Hyperlipidemia - Continue diet/meds, exercise,& lifestyle modifications. Continue monitor periodic cholesterol/liver & renal functions   3. Pre-Diabetes/Insulin Resistance - Continue diet, exercise, lifestyle modifications. Monitor appropriate labs.  4. Vitamin D Deficiency - Continue supplementation.  5. Testosterone Deficiency- monitor levels  Recommended regular exercise, BP monitoring, weight control, and discussed med and SE's. Recommended labs to assess and monitor clinical status. Further disposition pending results of labs.

## 2013-11-09 LAB — LIPID PANEL
Cholesterol: 103 mg/dL (ref 0–200)
HDL: 38 mg/dL — ABNORMAL LOW (ref >39)
LDL Cholesterol: 46 mg/dL (ref 0–99)
Total CHOL/HDL Ratio: 2.7 Ratio
Triglycerides: 97 mg/dL (ref <150)
VLDL: 19 mg/dL (ref 0–40)

## 2013-11-09 LAB — TSH: TSH: 1.562 u[IU]/mL (ref 0.350–4.500)

## 2013-11-09 LAB — BASIC METABOLIC PANEL WITH GFR
BUN: 10 mg/dL (ref 6–23)
CO2: 25 mEq/L (ref 19–32)
Calcium: 9.6 mg/dL (ref 8.4–10.5)
Chloride: 101 mEq/L (ref 96–112)
Creat: 1.03 mg/dL (ref 0.50–1.35)
GFR, Est African American: >89 mL/min
GFR, Est Non African American: 89 mL/min
Glucose, Bld: 142 mg/dL — ABNORMAL HIGH (ref 70–99)
Potassium: 4.6 mEq/L (ref 3.5–5.3)
Sodium: 137 mEq/L (ref 135–145)

## 2013-11-09 LAB — MAGNESIUM: Magnesium: 1.5 mg/dL (ref 1.5–2.5)

## 2013-11-09 LAB — HEPATIC FUNCTION PANEL
ALT: 33 U/L (ref 0–53)
AST: 23 U/L (ref 0–37)
Albumin: 4.3 g/dL (ref 3.5–5.2)
Alkaline Phosphatase: 46 U/L (ref 39–117)
Bilirubin, Direct: 0.2 mg/dL (ref 0.0–0.3)
Indirect Bilirubin: 0.9 mg/dL (ref 0.2–1.2)
Total Bilirubin: 1.1 mg/dL (ref 0.2–1.2)
Total Protein: 6.9 g/dL (ref 6.0–8.3)

## 2013-11-09 LAB — VITAMIN D 25 HYDROXY (VIT D DEFICIENCY, FRACTURES): Vit D, 25-Hydroxy: 87 ng/mL (ref 30–89)

## 2013-11-09 LAB — INSULIN, FASTING: Insulin fasting, serum: 88 u[IU]/mL — ABNORMAL HIGH (ref 3–28)

## 2013-11-09 LAB — TESTOSTERONE: Testosterone: 334 ng/dL (ref 300–890)

## 2013-12-06 ENCOUNTER — Other Ambulatory Visit: Payer: Self-pay | Admitting: Physician Assistant

## 2014-01-05 ENCOUNTER — Other Ambulatory Visit: Payer: Self-pay | Admitting: Emergency Medicine

## 2014-02-01 ENCOUNTER — Other Ambulatory Visit: Payer: Self-pay | Admitting: Internal Medicine

## 2014-02-13 ENCOUNTER — Ambulatory Visit (INDEPENDENT_AMBULATORY_CARE_PROVIDER_SITE_OTHER): Payer: BC Managed Care – PPO | Admitting: Physician Assistant

## 2014-02-13 ENCOUNTER — Encounter: Payer: Self-pay | Admitting: Physician Assistant

## 2014-02-13 VITALS — BP 110/70 | HR 88 | Temp 98.0°F | Resp 16 | Ht 69.5 in | Wt 229.0 lb

## 2014-02-13 DIAGNOSIS — I10 Essential (primary) hypertension: Secondary | ICD-10-CM

## 2014-02-13 DIAGNOSIS — E349 Endocrine disorder, unspecified: Secondary | ICD-10-CM | POA: Insufficient documentation

## 2014-02-13 DIAGNOSIS — E782 Mixed hyperlipidemia: Secondary | ICD-10-CM

## 2014-02-13 DIAGNOSIS — Z79899 Other long term (current) drug therapy: Secondary | ICD-10-CM

## 2014-02-13 DIAGNOSIS — E66811 Obesity, class 1: Secondary | ICD-10-CM | POA: Insufficient documentation

## 2014-02-13 DIAGNOSIS — E559 Vitamin D deficiency, unspecified: Secondary | ICD-10-CM

## 2014-02-13 DIAGNOSIS — E669 Obesity, unspecified: Secondary | ICD-10-CM

## 2014-02-13 DIAGNOSIS — E291 Testicular hypofunction: Secondary | ICD-10-CM

## 2014-02-13 DIAGNOSIS — E8881 Metabolic syndrome: Secondary | ICD-10-CM

## 2014-02-13 LAB — CBC WITH DIFFERENTIAL/PLATELET
Basophils Absolute: 0.1 10*3/uL (ref 0.0–0.1)
Basophils Relative: 1 % (ref 0–1)
Eosinophils Absolute: 0.4 10*3/uL (ref 0.0–0.7)
Eosinophils Relative: 4 % (ref 0–5)
HCT: 45.7 % (ref 39.0–52.0)
Hemoglobin: 16.5 g/dL (ref 13.0–17.0)
Lymphocytes Relative: 23 % (ref 12–46)
Lymphs Abs: 2.4 10*3/uL (ref 0.7–4.0)
MCH: 33.6 pg (ref 26.0–34.0)
MCHC: 36.1 g/dL — ABNORMAL HIGH (ref 30.0–36.0)
MCV: 93.1 fL (ref 78.0–100.0)
Monocytes Absolute: 0.8 10*3/uL (ref 0.1–1.0)
Monocytes Relative: 8 % (ref 3–12)
Neutro Abs: 6.8 10*3/uL (ref 1.7–7.7)
Neutrophils Relative %: 64 % (ref 43–77)
Platelets: 426 10*3/uL — ABNORMAL HIGH (ref 150–400)
RBC: 4.91 MIL/uL (ref 4.22–5.81)
RDW: 12.7 % (ref 11.5–15.5)
WBC: 10.6 10*3/uL — ABNORMAL HIGH (ref 4.0–10.5)

## 2014-02-13 MED ORDER — TESTOSTERONE 40.5 MG/2.5GM (1.62%) TD GEL: TRANSDERMAL | Status: DC

## 2014-02-13 NOTE — Progress Notes (Signed)
Assessment and Plan:  Hypertension: Continue medication, monitor blood pressure at home. Continue DASH diet.  Reminder to go to the ER if any CP, SOB, nausea, dizziness, severe HA, changes vision/speech, left arm numbness and tingling, and jaw pain. Cholesterol: Continue diet and exercise. Check cholesterol.  Pre-diabetes-Continue diet and exercise. Check A1C Vitamin D Def- check level and continue medications.  Hypogonadism- continue replacement therapy, increase 2 pumps each arm, check testosterone levels as needed.  Obesity with co morbidities- long discussion about weight loss, diet, and exercise   Continue diet and meds as discussed. Further disposition pending results of labs.  HPI 43 y.o. male  presents for 3 month follow up with hypertension, hyperlipidemia, prediabetes and vitamin D.  His blood pressure has been controlled at home, today their BP is BP: 110/70 mmHg He does not workout because he is busy at work. He denies chest pain, shortness of breath, dizziness.  He is on cholesterol medication and denies myalgias. His cholesterol is at goal. The cholesterol last visit was:   Lab Results  Component Value Date   CHOL 103 11/08/2013   HDL 38* 11/08/2013   LDLCALC 46 11/08/2013   TRIG 97 11/08/2013   CHOLHDL 2.7 11/08/2013   He has been working on diet and exercise for prediabetes, and denies paresthesia of the feet, polydipsia, polyuria and visual disturbances. Last A1C in the office was:  Lab Results  Component Value Date   HGBA1C 5.4 11/08/2013   Patient is on Vitamin D supplement.   Lab Results  Component Value Date   VD25OH 87 11/08/2013     BMI is Body mass index is 33.34 kg/(m^2)., he is struggling with weight loss due to increase stress at work.  Wt Readings from Last 3 Encounters:  02/13/14 229 lb (103.874 kg)  11/08/13 232 lb 9.6 oz (105.507 kg)  08/07/13 228 lb (103.42 kg)   He has a history of testosterone deficiency and is on testosterone replacement, he is on  androgel. He states that the testosterone helps with his energy, libido, muscle mass but not as much as before, he is only doing 1 pump each arm. Lab Results  Component Value Date   TESTOSTERONE 334 11/08/2013   Current Medications:  Current Outpatient Prescriptions on File Prior to Visit  Medication Sig Dispense Refill  . Cholecalciferol (VITAMIN D PO) Take 5,000 mg by mouth 2 (two) times daily.      . CRESTOR 40 MG tablet TAKE ONE TABLET BY MOUTH ONE TIME DAILY FOR CHOLESTEROL   30 tablet  5  . doxycycline (VIBRAMYCIN) 100 MG capsule Take 1 capsule (100 mg total) by mouth 2 (two) times daily. With food for Rosacea  60 capsule  99  . finasteride (PROSCAR) 5 MG tablet Take 5 mg by mouth daily.      . hydrocortisone 2.5 % lotion Apply to affected area 2 times daily  118 mL  3  . lisinopril (PRINIVIL,ZESTRIL) 20 MG tablet TAKE ONE TABLET BY MOUTH ONE TIME DAILY   90 tablet  1  . metroNIDAZOLE (METROGEL) 0.75 % gel Apply 1 application topically 2 (two) times daily.  45 g  99  . phentermine (ADIPEX-P) 37.5 MG tablet TAKE ONE TABLET BY MOUTH EVERY MORNING   30 tablet  3  . Testosterone (ANDROGEL) 40.5 MG/2.5GM (1.62%) GEL Dispense 1 bottle - Apply 2 pumps daily - refill x 6 months  2.5 g  5   No current facility-administered medications on file prior to visit.  Medical History:  Past Medical History  Diagnosis Date  . Hypertension   . Hyperlipidemia   . Hypogonadism male   . Insulin resistance   . Pre-diabetes    Allergies: No Known Allergies   Review of Systems: [X]  = complains of  [ ]  = denies  General: Fatigue [ ]  Fever [ ]  Chills [ ]  Weakness [ ]   Insomnia [ ]  Eyes: Redness [ ]  Blurred vision [ ]  Diplopia [ ]   ENT: Congestion [ ]  Sinus Pain [ ]  Post Nasal Drip [ ]  Sore Throat [ ]  Earache [ ]   Cardiac: Chest pain/pressure [ ]  SOB [ ]  Orthopnea [ ]   Palpitations [ ]   Paroxysmal nocturnal dyspnea[ ]  Claudication [ ]  Edema [ ]   Pulmonary: Cough [ ]  Wheezing[ ]   SOB [ ]   Snoring [ ]    GI: Nausea [ ]  Vomiting[ ]  Dysphagia[ ]  Heartburn[ ]  Abdominal pain [ ]  Constipation [ ] ; Diarrhea [ ] ; BRBPR [ ]  Melena[ ]  GU: Hematuria[ ]  Dysuria [ ]  Nocturia[ ]  Urgency [ ]   Hesitancy [ ]  Discharge [ ]  Neuro: Headaches[ ]  Vertigo[ ]  Paresthesias[ ]  Spasm [ ]  Speech changes [ ]  Incoordination [ ]   Ortho: Arthritis [ ]  Joint pain [ ]  Muscle pain [ ]  Joint swelling [ ]  Back Pain [ ]  Skin:  Rash [ ]   Pruritis [ ]  Change in skin lesion [ ]   Psych: Depression[ ]  Anxiety[ ]  Confusion [ ]  Memory loss [ ]   Heme/Lypmh: Bleeding [ ]  Bruising [ ]  Enlarged lymph nodes [ ]   Endocrine: Visual blurring [ ]  Paresthesia [ ]  Polyuria [ ]  Polydypsea [ ]    Heat/cold intolerance [ ]  Hypoglycemia [ ]   Family history- Review and unchanged Social history- Review and unchanged Physical Exam: BP 110/70  Pulse 88  Temp(Src) 98 F (36.7 C)  Resp 16  Ht 5' 9.5" (1.765 m)  Wt 229 lb (103.874 kg)  BMI 33.34 kg/m2 Wt Readings from Last 3 Encounters:  02/13/14 229 lb (103.874 kg)  11/08/13 232 lb 9.6 oz (105.507 kg)  08/07/13 228 lb (103.42 kg)   General Appearance: Well nourished, in no apparent distress. Eyes: PERRLA, EOMs, conjunctiva no swelling or erythema Sinuses: No Frontal/maxillary tenderness ENT/Mouth: Ext aud canals clear, TMs without erythema, bulging. No erythema, swelling, or exudate on post pharynx.  Tonsils not swollen or erythematous. Hearing normal.  Neck: Supple, thyroid normal.  Respiratory: Respiratory effort normal, BS equal bilaterally without rales, rhonchi, wheezing or stridor.  Cardio: RRR with no MRGs. Brisk peripheral pulses without edema.  Abdomen: Soft, + BS.  Non tender, no guarding, rebound, hernias, masses. Lymphatics: Non tender without lymphadenopathy.  Musculoskeletal: Full ROM, 5/5 strength, normal gait.  Skin: Warm, dry without rashes, lesions, ecchymosis.  Neuro: Cranial nerves intact. Normal muscle tone, no cerebellar symptoms. Sensation intact.  Psych: Awake and  oriented X 3, normal affect, Insight and Judgment appropriate.    Quentin Mullingollier, Jocilynn Grade, PA-C 1:45 PM [facility]

## 2014-02-13 NOTE — Patient Instructions (Signed)

## 2014-02-14 ENCOUNTER — Encounter: Payer: Self-pay | Admitting: Physician Assistant

## 2014-02-14 LAB — HEPATIC FUNCTION PANEL
ALT: 40 U/L (ref 0–53)
AST: 27 U/L (ref 0–37)
Albumin: 4.6 g/dL (ref 3.5–5.2)
Alkaline Phosphatase: 52 U/L (ref 39–117)
Bilirubin, Direct: 0.3 mg/dL (ref 0.0–0.3)
Indirect Bilirubin: 1 mg/dL (ref 0.2–1.2)
Total Bilirubin: 1.3 mg/dL — ABNORMAL HIGH (ref 0.2–1.2)
Total Protein: 6.8 g/dL (ref 6.0–8.3)

## 2014-02-14 LAB — TSH: TSH: 1.701 u[IU]/mL (ref 0.350–4.500)

## 2014-02-14 LAB — LIPID PANEL
Cholesterol: 110 mg/dL (ref 0–200)
HDL: 41 mg/dL (ref >39)
LDL Cholesterol: 44 mg/dL (ref 0–99)
Total CHOL/HDL Ratio: 2.7 Ratio
Triglycerides: 123 mg/dL (ref <150)
VLDL: 25 mg/dL (ref 0–40)

## 2014-02-14 LAB — BASIC METABOLIC PANEL WITH GFR
BUN: 10 mg/dL (ref 6–23)
CO2: 30 mEq/L (ref 19–32)
Calcium: 9.9 mg/dL (ref 8.4–10.5)
Chloride: 100 mEq/L (ref 96–112)
Creat: 1.03 mg/dL (ref 0.50–1.35)
GFR, Est African American: >89 mL/min
GFR, Est Non African American: 89 mL/min
Glucose, Bld: 113 mg/dL — ABNORMAL HIGH (ref 70–99)
Potassium: 4.4 mEq/L (ref 3.5–5.3)
Sodium: 139 mEq/L (ref 135–145)

## 2014-02-14 LAB — HEMOGLOBIN A1C
Hgb A1c MFr Bld: 5.3 % (ref <5.7)
Mean Plasma Glucose: 105 mg/dL (ref <117)

## 2014-02-14 LAB — VITAMIN D 25 HYDROXY (VIT D DEFICIENCY, FRACTURES): Vit D, 25-Hydroxy: 93 ng/mL — ABNORMAL HIGH (ref 30–89)

## 2014-02-14 LAB — INSULIN, FASTING: Insulin fasting, serum: 45.2 u[IU]/mL — ABNORMAL HIGH (ref 2.0–19.6)

## 2014-02-14 LAB — MAGNESIUM: Magnesium: 1.6 mg/dL (ref 1.5–2.5)

## 2014-03-28 ENCOUNTER — Other Ambulatory Visit: Payer: Self-pay | Admitting: Internal Medicine

## 2014-03-28 MED ORDER — PHENTERMINE HCL 37.5 MG PO TABS: 37.5000 mg | ORAL_TABLET | Freq: Every morning | ORAL | Status: DC

## 2014-03-28 NOTE — Progress Notes (Signed)
Called Rx in to [facility]

## 2014-04-02 ENCOUNTER — Other Ambulatory Visit: Payer: Self-pay | Admitting: *Deleted

## 2014-04-02 MED ORDER — FINASTERIDE 5 MG PO TABS: 5.0000 mg | ORAL_TABLET | Freq: Every day | ORAL | Status: DC

## 2014-04-26 ENCOUNTER — Other Ambulatory Visit: Payer: Self-pay | Admitting: Internal Medicine

## 2014-05-09 ENCOUNTER — Other Ambulatory Visit: Payer: Self-pay | Admitting: Internal Medicine

## 2014-05-09 ENCOUNTER — Encounter: Payer: Self-pay | Admitting: Internal Medicine

## 2014-05-09 ENCOUNTER — Ambulatory Visit (INDEPENDENT_AMBULATORY_CARE_PROVIDER_SITE_OTHER): Payer: BLUE CROSS/BLUE SHIELD | Admitting: Internal Medicine

## 2014-05-09 VITALS — BP 120/80 | HR 80 | Temp 98.1°F | Resp 16 | Ht 69.5 in | Wt 229.4 lb

## 2014-05-09 DIAGNOSIS — E782 Mixed hyperlipidemia: Secondary | ICD-10-CM

## 2014-05-09 DIAGNOSIS — E559 Vitamin D deficiency, unspecified: Secondary | ICD-10-CM

## 2014-05-09 DIAGNOSIS — Z125 Encounter for screening for malignant neoplasm of prostate: Secondary | ICD-10-CM

## 2014-05-09 DIAGNOSIS — E291 Testicular hypofunction: Secondary | ICD-10-CM

## 2014-05-09 DIAGNOSIS — L719 Rosacea, unspecified: Secondary | ICD-10-CM

## 2014-05-09 DIAGNOSIS — Z23 Encounter for immunization: Secondary | ICD-10-CM

## 2014-05-09 DIAGNOSIS — E669 Obesity, unspecified: Secondary | ICD-10-CM

## 2014-05-09 DIAGNOSIS — R5383 Other fatigue: Secondary | ICD-10-CM

## 2014-05-09 DIAGNOSIS — Z79899 Other long term (current) drug therapy: Secondary | ICD-10-CM

## 2014-05-09 DIAGNOSIS — I10 Essential (primary) hypertension: Secondary | ICD-10-CM

## 2014-05-09 DIAGNOSIS — E8881 Metabolic syndrome: Secondary | ICD-10-CM

## 2014-05-09 DIAGNOSIS — Z111 Encounter for screening for respiratory tuberculosis: Secondary | ICD-10-CM

## 2014-05-09 DIAGNOSIS — Z1212 Encounter for screening for malignant neoplasm of rectum: Secondary | ICD-10-CM

## 2014-05-09 NOTE — Progress Notes (Signed)
Patient ID: Noah Dennis, male   DOB: 21-May-1970, 44 y.o.   MRN: [id number] Annual Comprehensive Examination  This very nice 44 y.o.SWM presents for complete physical.  Patient has been followed for HTN, T2_NIDDM  Prediabetes, Hyperlipidemia, and Vitamin D Deficiency.   HTN predates since 2005. Patient's BP has been controlled at home.Today's BP: 120/80 mmHg. Patient denies any cardiac symptoms as chest pain, palpitations, shortness of breath, dizziness or ankle swelling.   Patient's hyperlipidemia is controlled with diet and medications. Patient denies myalgias or other medication SE's. Last lipids were at goal - Total Chol 110; HDL 41; LDL 44; Triglycerides 123 on  02/13/2014.   Patient has Morbid Obesity (BMI 30.40) and consequent  prediabetes / Insulin Resistance with A1c 4.8% and elevated Insulin 77 in July 2011 and patient denies reactive hypoglycemic symptoms, visual blurring, diabetic polys or paresthesias. Last A1c was  5.3% on 02/13/2014.     Finally, patient has history of Vitamin D Deficiency and last vitamin D was 93 on  02/13/2014.  Medication Sig  . VITAMIN D  Take 5,000 mg by mouth 2 (two) times daily.  . CRESTOR 40 MG tablet TAKE ONE TABLET BY MOUTH ONE TIME DAILY FOR CHOLESTEROL   . Doxycycline 100 MG capsule TAKE ONE CAPSULE BY MOUTH TWICE DAILY WITH FOOD for rosacea  . finasteride 5 MG tablet Take 1 tablet (5 mg total) by mouth daily.  Marland Kitchen lisinopril  20 MG tablet TAKE ONE TABLET BY MOUTH ONE TIME DAILY   . Testosterone (ANDROGEL) 40.5 MG/2.5GM (1.62%) GEL Dispense 2 bottle - Apply 4 pumps daily - refill x 6 months  . hydrocortisone 2.5 % lotion Apply to affected area 2 times daily  . phentermine (ADIPEX-P) 37.5 MG tablet Take 1 tablet (37.5 mg total) by mouth every morning. (Patient not taking: Reported on 05/09/2014)   No Known Allergies   Past Medical History  Diagnosis Date  . Hypertension   . Hyperlipidemia   . Hypogonadism male   . Insulin resistance   . Pre-diabetes     Health Maintenance  Topic Date Due  . TETANUS/TDAP  09/15/1989  . INFLUENZA VACCINE  11/25/2013   Immunization History  Administered Date(s) Administered  . PPD Test 05/08/2013, 05/09/2014  . Pneumococcal Polysaccharide-23 01/08/2010, 05/09/2014  . Td 10/20/2004  . Tdap 05/09/2014   Past Surgical History  Procedure Laterality Date  . Splenectomy for Hereditary Spherocytosis  1975   Family History  Problem Relation Age of Onset  . Diabetes Mother   . Hypertension Mother   . Heart disease Mother   . Hypertension Father   . Cancer Father    History   Social History  . Marital Status: Single    Spouse Name: N/A    Number of Children: N/A  . Years of Education: N/A   Occupational History  . [employer] Estate manager/land agent.   Social History Main Topics  . Smoking status: Never Smoker   . Smokeless tobacco: Not on file  . Alcohol Use: Yes     Comment: occasionally  . Drug Use: No  . Sexual Activity: Not on file     ROS Constitutional: Denies fever, chills, weight loss/gain, headaches, insomnia, fatigue, night sweats or change in appetite. Eyes: Denies redness, blurred vision, diplopia, discharge, itchy or watery eyes.  ENT: Denies discharge, congestion, post nasal drip, epistaxis, sore throat, earache, hearing loss, dental pain, Tinnitus, Vertigo, Sinus pain or snoring.  Cardio: Denies chest pain, palpitations, irregular heartbeat, syncope, dyspnea, diaphoresis,  orthopnea, PND, claudication or edema Respiratory: denies cough, dyspnea, DOE, pleurisy, hoarseness, laryngitis or wheezing.  Gastrointestinal: Denies dysphagia, heartburn, reflux, water brash, pain, cramps, nausea, vomiting, bloating, diarrhea, constipation, hematemesis, melena, hematochezia, jaundice or hemorrhoids Genitourinary: Denies dysuria, frequency, urgency, nocturia, hesitancy, discharge, hematuria or flank pain Musculoskeletal: Denies arthralgia, myalgia, stiffness, Jt. Swelling, pain, limp or  strain/sprain. Denies Falls. Skin: Denies puritis, rash, hives, warts, acne, eczema or change in skin lesion Neuro: No weakness, tremor, incoordination, spasms, paresthesia or pain Psychiatric: Denies confusion, memory loss or sensory loss. Denies Depression. Endocrine: Denies change in weight, skin, hair change, nocturia, and paresthesia, diabetic polys, visual blurring or hyper / hypo glycemic episodes.  Heme/Lymph: No excessive bleeding, bruising or enlarged lymph nodes.  Physical Exam  BP 120/80   Pulse 80  Temp 98.1 F   Resp 16  Ht 5' 9.5"   Wt 229 lb 6.4 oz     BMI 33.40   General Appearance: Well nourished, in no apparent distress. Eyes: PERRLA, EOMs, conjunctiva no swelling or erythema, normal fundi and vessels. Sinuses: No frontal/maxillary tenderness ENT/Mouth: EACs patent / TMs  nl. Nares clear without erythema, swelling, mucoid exudates. Oral hygiene is good. No erythema, swelling, or exudate. Tongue normal, non-obstructing. Tonsils not swollen or erythematous. Hearing normal.  Neck: Supple, thyroid normal. No bruits, nodes or JVD. Respiratory: Respiratory effort normal.  BS equal and clear bilateral without rales, rhonci, wheezing or stridor. Cardio: Heart sounds are normal with regular rate and rhythm and no murmurs, rubs or gallops. Peripheral pulses are normal and equal bilaterally without edema. No aortic or femoral bruits. Chest: symmetric with normal excursions and percussion.  Abdomen: Flat, soft, with bowl sounds. Nontender, no guarding, rebound, hernias, masses, or organomegaly.  Lymphatics: Non tender without lymphadenopathy.  Genitourinary: Declined by patient despite recommendations & rational to screen for rectal/prostate cancer. Musculoskeletal: Full ROM all peripheral extremities, joint stability, 5/5 strength, and normal gait. Skin: Warm and dry without rashes, lesions, cyanosis, clubbing or  ecchymosis.  Neuro: Cranial nerves intact, reflexes equal  bilaterally. Normal muscle tone, no cerebellar symptoms. Sensation intact.  Pysch: Awake and oriented X 3 with normal affect, insight and judgment appropriate.   Assessment and Plan   1. Hypertension  2. Hyperlipidemia 3. Pre Diabetes ? Insulin Resistance 4. Vitamin D Deficiency 5. Morbid Obesity (BMI 33.40)   Continue prudent diet as discussed, weight control, BP monitoring, regular exercise, and medications as discussed.  Discussed med effects and SE's. Routine screening labs and tests as requested with regular follow-up as recommended.

## 2014-05-09 NOTE — Patient Instructions (Signed)

## 2014-05-10 LAB — HEPATIC FUNCTION PANEL
ALT: 40 U/L (ref 0–53)
AST: 27 U/L (ref 0–37)
Albumin: 4.2 g/dL (ref 3.5–5.2)
Alkaline Phosphatase: 55 U/L (ref 39–117)
Bilirubin, Direct: 0.2 mg/dL (ref 0.0–0.3)
Indirect Bilirubin: 0.8 mg/dL (ref 0.2–1.2)
Total Bilirubin: 1 mg/dL (ref 0.2–1.2)
Total Protein: 6.9 g/dL (ref 6.0–8.3)

## 2014-05-10 LAB — BASIC METABOLIC PANEL WITH GFR
BUN: 8 mg/dL (ref 6–23)
CO2: 27 mEq/L (ref 19–32)
Calcium: 9.8 mg/dL (ref 8.4–10.5)
Chloride: 98 mEq/L (ref 96–112)
Creat: 1.08 mg/dL (ref 0.50–1.35)
GFR, Est African American: >89 mL/min
GFR, Est Non African American: 84 mL/min
Glucose, Bld: 110 mg/dL — ABNORMAL HIGH (ref 70–99)
Potassium: 4.3 mEq/L (ref 3.5–5.3)
Sodium: 136 mEq/L (ref 135–145)

## 2014-05-10 LAB — CBC WITH DIFFERENTIAL/PLATELET
Basophils Absolute: 0.1 10*3/uL (ref 0.0–0.1)
Basophils Relative: 1 % (ref 0–1)
Eosinophils Absolute: 0.2 10*3/uL (ref 0.0–0.7)
Eosinophils Relative: 2 % (ref 0–5)
HCT: 46.5 % (ref 39.0–52.0)
Hemoglobin: 16.5 g/dL (ref 13.0–17.0)
Lymphocytes Relative: 27 % (ref 12–46)
Lymphs Abs: 2.9 10*3/uL (ref 0.7–4.0)
MCH: 33.3 pg (ref 26.0–34.0)
MCHC: 35.5 g/dL (ref 30.0–36.0)
MCV: 93.9 fL (ref 78.0–100.0)
MPV: 8.5 fL — ABNORMAL LOW (ref 8.6–12.4)
Monocytes Absolute: 1 10*3/uL (ref 0.1–1.0)
Monocytes Relative: 9 % (ref 3–12)
Neutro Abs: 6.6 10*3/uL (ref 1.7–7.7)
Neutrophils Relative %: 61 % (ref 43–77)
Platelets: 487 10*3/uL — ABNORMAL HIGH (ref 150–400)
RBC: 4.95 MIL/uL (ref 4.22–5.81)
RDW: 12.8 % (ref 11.5–15.5)
WBC: 10.8 10*3/uL — ABNORMAL HIGH (ref 4.0–10.5)

## 2014-05-10 LAB — IRON AND TIBC
%SAT: 30 % (ref 20–55)
Iron: 95 ug/dL (ref 42–165)
TIBC: 321 ug/dL (ref 215–435)
UIBC: 226 ug/dL (ref 125–400)

## 2014-05-10 LAB — LIPID PANEL
Cholesterol: 122 mg/dL (ref 0–200)
HDL: 39 mg/dL — ABNORMAL LOW (ref >39)
LDL Cholesterol: 56 mg/dL (ref 0–99)
Total CHOL/HDL Ratio: 3.1 Ratio
Triglycerides: 137 mg/dL (ref <150)
VLDL: 27 mg/dL (ref 0–40)

## 2014-05-10 LAB — HEMOGLOBIN A1C
Hgb A1c MFr Bld: 5.1 % (ref <5.7)
Mean Plasma Glucose: 100 mg/dL (ref <117)

## 2014-05-10 LAB — URINALYSIS, MICROSCOPIC ONLY
Bacteria, UA: NONE SEEN
Casts: NONE SEEN
Crystals: NONE SEEN
Squamous Epithelial / LPF: NONE SEEN

## 2014-05-10 LAB — INSULIN, FASTING: Insulin fasting, serum: 25.9 u[IU]/mL — ABNORMAL HIGH (ref 2.0–19.6)

## 2014-05-10 LAB — VITAMIN B12: Vitamin B-12: 658 pg/mL (ref 211–911)

## 2014-05-10 LAB — MAGNESIUM: Magnesium: 1.7 mg/dL (ref 1.5–2.5)

## 2014-05-10 LAB — TSH: TSH: 1.135 u[IU]/mL (ref 0.350–4.500)

## 2014-05-10 LAB — MICROALBUMIN / CREATININE URINE RATIO
Creatinine, Urine: 206.8 mg/dL
Microalb Creat Ratio: 2.9 mg/g (ref 0.0–30.0)
Microalb, Ur: 0.6 mg/dL (ref <2.0)

## 2014-05-10 LAB — VITAMIN D 25 HYDROXY (VIT D DEFICIENCY, FRACTURES): Vit D, 25-Hydroxy: 76 ng/mL (ref 30–100)

## 2014-05-14 LAB — TESTOSTERONE: Testosterone: 294 ng/dL — ABNORMAL LOW (ref 300–890)

## 2014-05-14 LAB — TB SKIN TEST
Induration: 0 mm
TB Skin Test: NEGATIVE

## 2014-06-24 ENCOUNTER — Other Ambulatory Visit: Payer: Self-pay | Admitting: Internal Medicine

## 2014-07-18 ENCOUNTER — Other Ambulatory Visit: Payer: Self-pay | Admitting: Internal Medicine

## 2014-07-18 MED ORDER — PHENTERMINE HCL 37.5 MG PO TABS: 37.5000 mg | ORAL_TABLET | Freq: Every morning | ORAL | Status: DC

## 2014-08-05 ENCOUNTER — Other Ambulatory Visit: Payer: Self-pay | Admitting: Physician Assistant

## 2014-08-10 ENCOUNTER — Other Ambulatory Visit: Payer: Self-pay | Admitting: Physician Assistant

## 2014-08-10 DIAGNOSIS — E349 Endocrine disorder, unspecified: Secondary | ICD-10-CM

## 2014-08-28 ENCOUNTER — Encounter: Payer: Self-pay | Admitting: Physician Assistant

## 2014-08-28 ENCOUNTER — Ambulatory Visit (INDEPENDENT_AMBULATORY_CARE_PROVIDER_SITE_OTHER): Payer: BLUE CROSS/BLUE SHIELD | Admitting: Physician Assistant

## 2014-08-28 VITALS — BP 120/68 | HR 80 | Temp 97.7°F | Resp 16 | Ht 69.5 in | Wt 231.0 lb

## 2014-08-28 DIAGNOSIS — E8881 Metabolic syndrome: Secondary | ICD-10-CM

## 2014-08-28 DIAGNOSIS — E669 Obesity, unspecified: Secondary | ICD-10-CM

## 2014-08-28 DIAGNOSIS — E559 Vitamin D deficiency, unspecified: Secondary | ICD-10-CM

## 2014-08-28 DIAGNOSIS — E782 Mixed hyperlipidemia: Secondary | ICD-10-CM

## 2014-08-28 DIAGNOSIS — E291 Testicular hypofunction: Secondary | ICD-10-CM

## 2014-08-28 DIAGNOSIS — I10 Essential (primary) hypertension: Secondary | ICD-10-CM

## 2014-08-28 DIAGNOSIS — Z79899 Other long term (current) drug therapy: Secondary | ICD-10-CM

## 2014-08-28 MED ORDER — PHENTERMINE HCL 37.5 MG PO TABS: 37.5000 mg | ORAL_TABLET | Freq: Every morning | ORAL | Status: DC

## 2014-08-28 NOTE — Patient Instructions (Signed)
9 Ways to Naturally Increase Testosterone Levels  1.   Lose Weight If you're overweight, shedding the excess pounds may increase your testosterone levels, according to research presented at the Endocrine Society's 2012 meeting. Overweight men are more likely to have low testosterone levels to begin with, so this is an important trick to increase your body's testosterone production when you need it most.  2.   High-Intensity Exercise like Peak [employer]itness  Short intense exercise has a proven positive effect on increasing testosterone levels and preventing its decline. That's unlike aerobics or prolonged moderate exercise, which have shown to have negative or no effect on testosterone levels. Having a whey protein meal after exercise can further enhance the satiety/testosterone-boosting impact (hunger hormones cause the opposite effect on your testosterone and libido). Here's a summary of what a typical high-intensity [employer] routine might look like: " Warm up for three minutes  " Exercise as hard and fast as you can for 30 seconds. You should feel like you couldn't possibly go on another few seconds  " Recover at a slow to moderate pace for 90 seconds  " Repeat the high intensity exercise and recovery 7 more times .  3.   Consume Plenty of Zinc The mineral zinc is important for testosterone production, and supplementing your diet for as little as six weeks has been shown to cause a marked improvement in testosterone among men with low levels.1 Likewise, research has shown that restricting dietary sources of zinc leads to a significant decrease in testosterone, while zinc supplementation increases it2 -- and even protects men from exercised-induced reductions in testosterone levels.3 It's estimated that up to 45 percent of adults over the age of 60 may have lower than recommended zinc intakes; even when dietary supplements were added in, an estimated 20-25 percent of older adults still had inadequate  zinc intakes, according to a National Health and Nutrition Examination Survey.4 Your diet is the best source of zinc; along with protein-rich foods like meats and fish, other good dietary sources of zinc include raw milk, raw cheese, beans, and yogurt or kefir made from raw milk. It can be difficult to obtain enough dietary zinc if you're a vegetarian, and also for meat-eaters as well, largely because of conventional farming methods that rely heavily on chemical fertilizers and pesticides. These chemicals deplete the soil of nutrients ... nutrients like zinc that must be absorbed by plants in order to be passed on to you. In many cases, you may further deplete the nutrients in your food by the way you prepare it. For most food, cooking it will drastically reduce its levels of nutrients like zinc . particularly over-cooking, which many people do. If you decide to use a zinc supplement, stick to a dosage of less than 40 mg a day, as this is the recommended adult upper limit. Taking too much zinc can interfere with your body's ability to absorb other minerals, especially copper, and may cause nausea as a side effect.  4.   Strength Training In addition to [employer]itness, strength training is also known to boost testosterone levels, provided you are doing so intensely enough. When strength training to boost testosterone, you'll want to increase the weight and lower your number of reps, and then focus on exercises that work a large number of muscles, such as dead lifts or squats.  You can "turbo-charge" your weight training by going slower. By slowing down your movement, you're actually turning it into a high-intensity exercise. Super Slow   movement allows your muscle, at the microscopic level, to access the maximum number of cross-bridges between the protein filaments that produce movement in the muscle.   5.   Optimize Your Vitamin D Levels Vitamin D, a steroid hormone, is essential for the healthy development  of the nucleus of the sperm cell, and helps maintain semen quality and sperm count. Vitamin D also increases levels of testosterone, which may boost libido. In one study, overweight men who were given vitamin D supplements had a significant increase in testosterone levels after one year.5   6.   Reduce Stress When you're under a lot of stress, your body releases high levels of the stress hormone cortisol. This hormone actually blocks the effects of testosterone,6 presumably because, from a biological standpoint, testosterone-associated behaviors (mating, competing, aggression) may have lowered your chances of survival in an emergency (hence, the "fight or flight" response is dominant, courtesy of cortisol).  7.   Limit or Eliminate Sugar from Your Diet Testosterone levels decrease after you eat sugar, which is likely because the sugar leads to a high insulin level, another factor leading to low testosterone.7 Based on USDA estimates, the average American consumes 12 teaspoons of sugar a day, which equates to about TWO TONS of sugar during a lifetime.  8.   Eat Healthy Fats By healthy, this means not only mon- and polyunsaturated fats, like that found in avocadoes and nuts, but also saturated, as these are essential for building testosterone. Research shows that a diet with less than 40 percent of energy as fat (and that mainly from animal sources, i.e. saturated) lead to a decrease in testosterone levels.8 My personal diet is about 60-70 percent healthy fat, and other experts agree that the ideal diet includes somewhere between 50-70 percent fat.  It's important to understand that your body requires saturated fats from animal and vegetable sources (such as meat, dairy, certain oils, and tropical plants like coconut) for optimal functioning, and if you neglect this important food group in favor of sugar, grains and other starchy carbs, your health and weight are almost guaranteed to suffer. Examples of  healthy fats you can eat more of to give your testosterone levels a boost include: Olives and Olive oil  Coconuts and coconut oil Butter made from raw grass-fed organic milk Raw nuts, such as, almonds or pecans Organic pastured egg yolks Avocados Grass-fed meats Palm oil Unheated organic nut oils   9.   Boost Your Intake of Branch Chain Amino Acids (BCAA) from Foods Like Noah Dennis suggests that BCAAs result in higher testosterone levels, particularly when taken along with resistance training.9 While BCAAs are available in supplement form, you'll find the highest concentrations of BCAAs like leucine in dairy products - especially quality cheeses and whey protein. Even when getting leucine from your natural food supply, it's often wasted or used as a building block instead of an anabolic agent. So to create the correct anabolic environment, you need to boost leucine consumption way beyond mere maintenance levels. That said, keep in mind that using leucine as a free form amino acid can be highly counterproductive as when free form amino acids are artificially administrated, they rapidly enter your circulation while disrupting insulin function, and impairing your body's glycemic control. Food-based leucine is really the ideal form that can benefit your muscles without side effects.   Before you even begin to attack a weight-loss plan, it pays to remember this: You are not fat. You have fat. Losing weight isn't  about blame or shame; it's simply another achievement to accomplish. Dieting is like any other skill-you have to buckle down and work at it. As long as you act in a smart, reasonable way, you'll ultimately get where you want to be. Here are some weight loss pearls for you.  1. It's Not a Diet. It's a Lifestyle Thinking of a diet as something you're on and suffering through only for the short term doesn't work. To shed weight and keep it off, you need to make permanent changes to the way you  eat. It's OK to indulge occasionally, of course, but if you cut calories temporarily and then revert to your old way of eating, you'll gain back the weight quicker than you can say yo-yo. Use it to lose it. Research shows that one of the best predictors of long-term weight loss is how many pounds you drop in the first month. For that reason, nutritionists often suggest being stricter for the first two weeks of your new eating strategy to build momentum. Cut out added sugar and alcohol and avoid unrefined carbs. After that, figure out how you can reincorporate them in a way that's healthy and maintainable.  2. There's a Right Way to Exercise Working out burns calories and fat and boosts your metabolism by building muscle. But those trying to lose weight are notorious for overestimating the number of calories they burn and underestimating the amount they take in. Unfortunately, your system is biologically programmed to hold on to extra pounds and that means when you start exercising, your body senses the deficit and ramps up its hunger signals. If you're not diligent, you'll eat everything you burn and then some. Use it to lose it. Cardio gets all the exercise glory, but strength and interval training are the real heroes. They help you build lean muscle, which in turn increases your metabolism and calorie-burning ability 3. Don't Overreact to Mild Hunger Some people have a hard time losing weight because of hunger anxiety. To them, being hungry is bad-something to be avoided at all costs-so they carry snacks with them and eat when they don't need to. Others eat because they're stressed out or bored. While you never want to get to the point of being ravenous (that's when bingeing is likely to happen), a hunger pang, a craving, or the fact that it's 3:00 p.m. should not send you racing for the vending machine or obsessing about the energy bar in your purse. Ideally, you should put off eating until your stomach is  growling and it's difficult to concentrate.  Use it to lose it. When you feel the urge to eat, use the HALT method. Ask yourself, Am I really hungry? Or am I angry or anxious, lonely or bored, or tired? If you're still not certain, try the apple test. If you're truly hungry, an apple should seem delicious; if it doesn't, something else is going on. Or you can try drinking water and making yourself busy, if you are still hungry try a healthy snack.  4. Not All Calories Are Created Equal The mechanics of weight loss are pretty simple: Take in fewer calories than you use for energy. But the kind of food you eat makes all the difference. Processed food that's high in saturated fat and refined starch or sugar can cause inflammation that disrupts the hormone signals that tell your brain you're full. The result: You eat a lot more.  Use it to lose it. Clean up your diet. Swap in  whole, unprocessed foods, including vegetables, lean protein, and healthy fats that will fill you up and give you the biggest nutritional bang for your calorie buck. In a few weeks, as your brain starts receiving regular hunger and fullness signals once again, you'll notice that you feel less hungry overall and naturally start cutting back on the amount you eat.  5. Protein, Produce, and Plant-Based Fats Are Your Weight-Loss Trinity Here's why eating the three Ps regularly will help you drop pounds. Protein fills you up. You need it to build lean muscle, which keeps your metabolism humming so that you can torch more fat. People in a weight-loss program who ate double the recommended daily allowance for protein (about 110 grams for a 150-pound woman) lost 70 percent of their weight from fat, while people who ate the RDA lost only about 40 percent, one study found. Produce is packed with filling fiber. "It's very difficult to consume too many calories if you're eating a lot of vegetables. Example: Three cups of broccoli is a lot of food, yet  only 93 calories. (Fruit is another story. It can be easy to overeat and can contain a lot of calories from sugar, so be sure to monitor your intake.) Plant-based fats like olive oil and those in avocados and nuts are healthy and extra satiating.  Use it to lose it. Aim to incorporate each of the three Ps into every meal and snack. People who eat protein throughout the day are able to keep weight off, according to a study in the [place] of Clinical Nutrition. In addition to meat, poultry and seafood, good sources are beans, lentils, eggs, tofu, and yogurt. As for fat, keep portion sizes in check by measuring out salad dressing, oil, and nut butters (shoot for one to two tablespoons). Finally, eat veggies or a little fruit at every meal. People who did that consumed 308 fewer calories but didn't feel any hungrier than when they didn't eat more produce.  7. How You Eat Is As Important As What You Eat In order for your brain to register that you're full, you need to focus on what you're eating. Sit down whenever you eat, preferably at a table. Turn off the TV or computer, put down your phone, and look at your food. Smell it. Chew slowly, and don't put another bite on your fork until you swallow. When women ate lunch this attentively, they consumed 30 percent less when snacking later than those who listened to an audiobook at lunchtime, according to a study in the [place] of Nutrition. 8. Weighing Yourself Really Works The scale provides the best evidence about whether your efforts are paying off. Seeing the numbers tick up or down or stagnate is motivation to keep going-or to rethink your approach. A 2015 study at [facility] found that daily weigh-ins helped people lose more weight, keep it off, and maintain that loss, even after two years. Use it to lose it. Step on the scale at the same time every day for the best results. If your weight shoots up several pounds from one weigh-in to  the next, don't freak out. Eating a lot of salt the night before or having your period is the likely culprit. The number should return to normal in a day or two. It's a steady climb that you need to do something about. 9. Too Much Stress and Too Little Sleep Are Your Enemies When you're tired and frazzled, your body cranks up the production of  cortisol, the stress hormone that can cause carb cravings. Not getting enough sleep also boosts your levels of ghrelin, a hormone associated with hunger, while suppressing leptin, a hormone that signals fullness and satiety. People on a diet who slept only five and a half hours a night for two weeks lost 55 percent less fat and were hungrier than those who slept eight and a half hours, according to a study in the [place] [employer] Journal. Use it to lose it. Prioritize sleep, aiming for seven hours or more a night, which research shows helps lower stress. And make sure you're getting quality zzz's. If a snoring spouse or a fidgety cat wakes you up frequently throughout the night, you may end up getting the equivalent of just four hours of sleep, according to a study from [facility]. Keep pets out of the bedroom, and use a white-noise app to drown out snoring. 10. You Will Hit a plateau-And You Can Bust Through It As you slim down, your body releases much less leptin, the fullness hormone.  If you're not strength training, start right now. Building muscle can raise your metabolism to help you overcome a plateau. To keep your body challenged and burning calories, incorporate new moves and more intense intervals into your workouts or add another sweat session to your weekly routine. Alternatively, cut an extra 100 calories or so a day from your diet. Now that you've lost weight, your body simply doesn't need as much fuel.      Bad carbs also include fruit juice, alcohol, and sweet tea. These are empty calories that do not signal to your brain that  you are full.   Please remember the good carbs are still carbs which convert into sugar. So please measure them out no more than 1/2-1 cup of rice, oatmeal, pasta, and beans  Veggies are however free foods! Pile them on.   Not all fruit is created equal. Please see the list below, the fruit at the bottom is higher in sugars than the fruit at the top. Please avoid all dried fruits.     Ways to cut 100 calories  1. Eat your eggs with hot sauce OR salsa instead of cheese.  Eggs are great for breakfast, but many people consider eggs and cheese to be BFFs. Instead of cheese-1 oz. of cheddar has 114 calories-top your eggs with hot sauce, which contains no calories and helps with satiety and metabolism. Salsa is also a great option!!  2. Top your toast, waffles or pancakes with mashed berries instead of jelly or syrup. Half a cup of berries-fresh, frozen or thawed-has about 40 calories, compared with 2 tbsp. of maple syrup or jelly, which both have about 100 calories. The berries will also give you a good punch of fiber, which helps keep you full and satisfied and won't spike blood sugar quickly like the jelly or syrup. 3. Swap the non-fat latte for black coffee with a splash of half-and-half. Contrary to its name, that non-fat latte has 130 calories and a startling 19g of carbohydrates per 16 oz. serving. Replacing that 'light' drinkable dessert with a black coffee with a splash of half-and-half saves you more than 100 calories per 16 oz. serving. 4. Sprinkle salads with freeze-dried raspberries instead of dried cranberries. If you want a sweet addition to your nutritious salad, stay away from dried cranberries. They have a whopping 130 calories per  cup and 30g carbohydrates. Instead, sprinkle freeze-dried raspberries guilt-free and save more than  100 calories per  cup serving, adding 3g of belly-filling fiber. 5. Go for mustard in place of mayo on your sandwich. Mustard can add really nice flavor to  any sandwich, and there are tons of varieties, from spicy to honey. A serving of mayo is 95 calories, versus 10 calories in a serving of mustard. 6. Choose a DIY salad dressing instead of the store-bought kind. Mix Dijon or whole grain mustard with low-fat Kefir or red wine vinegar and garlic. 7. Use hummus as a spread instead of a dip. Use hummus as a spread on a high-fiber cracker or tortilla with a sandwich and save on calories without sacrificing taste. 8. Pick just one salad "accessory." Salad isn't automatically a calorie winner. It's easy to over-accessorize with toppings. Instead of topping your salad with nuts, avocado and cranberries (all three will clock in at 313 calories), just pick one. The next day, choose a different accessory, which will also keep your salad interesting. You don't wear all your jewelry every day, right? 9. Ditch the white pasta in favor of spaghetti squash. One cup of cooked spaghetti squash has about 40 calories, compared with traditional spaghetti, which comes with more than 200. Spaghetti squash is also nutrient-dense. It's a good source of fiber and Vitamins A and C, and it can be eaten just like you would eat pasta-with a great tomato sauce and [place] meatballs or with pesto, tofu and spinach, for example. 10. Dress up your chili, soups and stews with non-fat [place] yogurt instead of sour cream. Just a 'dollop' of sour cream can set you back 115 calories and a whopping 12g of fat-seven of which are of the artery-clogging variety. Added bonus: [place] yogurt is packed with muscle-building protein, calcium and B Vitamins. 11. Mash cauliflower instead of mashed potatoes. One cup of traditional mashed potatoes-in all their creamy goodness-has more than 200 calories, compared to mashed cauliflower, which you can typically eat for less than 100 calories per 1 cup serving. Cauliflower is a great source of the antioxidant indole-3-carbinol (I3C), which may help reduce the risk  of some cancers, like breast cancer. 12. Ditch the ice cream sundae in favor of a [place] yogurt parfait. Instead of a cup of ice cream or fro-yo for dessert, try 1 cup of nonfat Greek yogurt topped with fresh berries and a sprinkle of cacao nibs. Both toppings are packed with antioxidants, which can help reduce cellular inflammation and oxidative damage. And the comparison is a no-brainer: One cup of ice cream has about 275 calories; one cup of frozen yogurt has about 230; and a cup of Greek yogurt has just 130, plus twice the protein, so you're less likely to return to the freezer for a second helping. 13. Put olive oil in a spray container instead of using it directly from the bottle. Each tablespoon of olive oil is 120 calories and 15g of fat. Use a mister instead of pouring it straight into the pan or onto a salad. This allows for portion control and will save you more than 100 calories. 14. When baking, substitute canned pumpkin for butter or oil. Canned pumpkin-not pumpkin pie mix-is loaded with Vitamin A, which is important for skin and eye health, as well as immunity. And the comparisons are pretty crazy:  cup of canned pumpkin has about 40 calories, compared to butter or oil, which has more than 800 calories. Yes, 800 calories. Applesauce and mashed banana can also serve as good substitutions for butter or oil, usually  in a 1:1 ratio. 15. Top casseroles with high-fiber cereal instead of breadcrumbs. Breadcrumbs are typically made with white bread, while breakfast cereals contain 5-9g of fiber per serving. Not only will you save more than 150 calories per  cup serving, the swap will also keep you more full and you'll get a metabolism boost from the added fiber. 16. Snack on pistachios instead of macadamia nuts. Believe it or not, you get the same amount of calories from 35 pistachios (100 calories) as you would from only five macadamia nuts. 17. Chow down on kale chips rather than potato  chips. This is my favorite 'don't knock it 'till you try it' swap. Kale chips are so easy to make at home, and you can spice them up with a little grated parmesan or chili powder. Plus, they're a mere fraction of the calories of potato chips, but with the same crunch factor we crave so often. 18. Add seltzer and some fruit slices to your cocktail instead of soda or fruit juice. One cup of soda or fruit juice can pack on as much as 140 calories. Instead, use seltzer and fruit slices. The fruit provides valuable phytochemicals, such as flavonoids and anthocyanins, which help to combat cancer and stave off the aging process.

## 2014-08-28 NOTE — Progress Notes (Signed)
Assessment and Plan:  1. Hypertension -Continue medication, monitor blood pressure at home. Continue DASH diet.  Reminder to go to the ER if any CP, SOB, nausea, dizziness, severe HA, changes vision/speech, left arm numbness and tingling and jaw pain.  2. Cholesterol -Continue diet and exercise. Check cholesterol.   3. Prediabetes  -Continue diet and exercise. Check A1C  4. Vitamin D Def - check level and continue medications.   5. Obesity with co morbidities-  long discussion about weight loss, diet, and exercise  6. Hypogonadism-  continue replacement therapy, had increase from 2 pumps to 4 pumps, check testosterone levels as needed.    Continue diet and meds as discussed. Further disposition pending results of labs. Over 30 minutes of exam, counseling, chart review, and critical decision making was performed  HPI 44 y.o. male  presents for 3 month follow up on hypertension, cholesterol, prediabetes, and vitamin D deficiency.   His blood pressure has been controlled at home on lisinopril, today their BP is BP: 120/68 mmHg  He does not workout. He denies chest pain, shortness of breath, dizziness.  He is on cholesterol medication, crestor  and denies myalgias. His cholesterol is at goal. The cholesterol last visit was:   Lab Results  Component Value Date   CHOL 122 05/09/2014   HDL 39* 05/09/2014   LDLCALC 56 05/09/2014   TRIG 137 05/09/2014   CHOLHDL 3.1 05/09/2014   He has been working on diet and exercise for insulin resistance, and denies paresthesia of the feet, polydipsia, polyuria and visual disturbances. Last A1C in the office was:  Lab Results  Component Value Date   HGBA1C 5.1 05/09/2014  Patient is on Vitamin D supplement.   Lab Results  Component Value Date   VD25OH 76 05/09/2014  BMI is Body mass index is 33.64 kg/(m^2)., he is working on diet and exercise, he has been off the phentermine for several months. Wt Readings from Last 3 Encounters:  08/28/14  231 lb (104.781 kg)  05/09/14 229 lb 6.4 oz (104.055 kg)  02/13/14 229 lb (103.874 kg)  He has a history of testosterone deficiency and is on testosterone replacement, 4 pumps total daily He states that the testosterone helps with his energy, libido, muscle mass. Lab Results  Component Value Date   TESTOSTERONE 294* 05/09/2014    Current Medications:  Current Outpatient Prescriptions on File Prior to Visit  Medication Sig Dispense Refill  . ANDROGEL PUMP 20.25 MG/ACT (1.62%) GEL APPLY 4 PUMPS DAILY AS DIRECTED. 150 g 5  . Cholecalciferol (VITAMIN D PO) Take 5,000 mg by mouth 2 (two) times daily.    . CRESTOR 40 MG tablet TAKE ONE TABLET BY MOUTH ONE TIME DAILY FOR CHOLESTEROL. 30 tablet 3  . doxycycline (VIBRAMYCIN) 100 MG capsule TAKE ONE CAPSULE BY MOUTH TWICE DAILY WITH FOOD for rosacea 60 capsule PRN  . finasteride (PROSCAR) 5 MG tablet Take 1 tablet (5 mg total) by mouth daily. 90 tablet 1  . lisinopril (PRINIVIL,ZESTRIL) 20 MG tablet TAKE ONE TABLET BY MOUTH ONE TIME DAILY  90 tablet 1  . phentermine (ADIPEX-P) 37.5 MG tablet Take 1 tablet (37.5 mg total) by mouth every morning. 30 tablet 3  . hydrocortisone 2.5 % lotion Apply to affected area 2 times daily 118 mL 3   No current facility-administered medications on file prior to visit.   Medical History:  Past Medical History  Diagnosis Date  . Hypertension   . Hyperlipidemia   . Hypogonadism male   .  Insulin resistance   . Pre-diabetes    Allergies: No Known Allergies   Review of Systems:  Review of Systems  Constitutional: Negative.   HENT: Negative.   Eyes: Negative.   Respiratory: Negative.   Cardiovascular: Negative.   Gastrointestinal: Negative.   Genitourinary: Negative.   Musculoskeletal: Negative.   Skin: Negative.   Neurological: Negative.   Endo/Heme/Allergies: Negative.   Psychiatric/Behavioral: Negative.     Family history- Review and unchanged Social history- Review and unchanged Physical  Exam: BP 120/68 mmHg  Pulse 80  Temp(Src) 97.7 F (36.5 C)  Resp 16  Ht 5' 9.5" (1.765 m)  Wt 231 lb (104.781 kg)  BMI 33.64 kg/m2 Wt Readings from Last 3 Encounters:  08/28/14 231 lb (104.781 kg)  05/09/14 229 lb 6.4 oz (104.055 kg)  02/13/14 229 lb (103.874 kg)   General Appearance: Well nourished, in no apparent distress. Eyes: PERRLA, EOMs, conjunctiva no swelling or erythema Sinuses: No Frontal/maxillary tenderness ENT/Mouth: Ext aud canals clear, TMs without erythema, bulging. No erythema, swelling, or exudate on post pharynx.  Tonsils not swollen or erythematous. Hearing normal.  Neck: Supple, thyroid normal.  Respiratory: Respiratory effort normal, BS equal bilaterally without rales, rhonchi, wheezing or stridor.  Cardio: RRR with no MRGs. Brisk peripheral pulses without edema.  Abdomen: Soft, + BS,  Non tender, no guarding, rebound, hernias, masses. Lymphatics: Non tender without lymphadenopathy.  Musculoskeletal: Full ROM, 5/5 strength, Normal gait Skin: Warm, dry without rashes, lesions, ecchymosis.  Neuro: Cranial nerves intact. Normal muscle tone, no cerebellar symptoms. Psych: Awake and oriented X 3, normal affect, Insight and Judgment appropriate.    Quentin Mullingollier, Stefani Baik, PA-C 1:48 PM [facility]

## 2014-08-29 LAB — CBC WITH DIFFERENTIAL/PLATELET
Basophils Absolute: 0.1 10*3/uL (ref 0.0–0.1)
Basophils Relative: 1 % (ref 0–1)
Eosinophils Absolute: 0.5 10*3/uL (ref 0.0–0.7)
Eosinophils Relative: 5 % (ref 0–5)
HCT: 45.3 % (ref 39.0–52.0)
Hemoglobin: 16.2 g/dL (ref 13.0–17.0)
Lymphocytes Relative: 28 % (ref 12–46)
Lymphs Abs: 2.9 10*3/uL (ref 0.7–4.0)
MCH: 33.3 pg (ref 26.0–34.0)
MCHC: 35.8 g/dL (ref 30.0–36.0)
MCV: 93.2 fL (ref 78.0–100.0)
MPV: 8.7 fL (ref 8.6–12.4)
Monocytes Absolute: 0.8 10*3/uL (ref 0.1–1.0)
Monocytes Relative: 8 % (ref 3–12)
Neutro Abs: 6.1 10*3/uL (ref 1.7–7.7)
Neutrophils Relative %: 58 % (ref 43–77)
Platelets: 455 10*3/uL — ABNORMAL HIGH (ref 150–400)
RBC: 4.86 MIL/uL (ref 4.22–5.81)
RDW: 13.3 % (ref 11.5–15.5)
WBC: 10.5 10*3/uL (ref 4.0–10.5)

## 2014-08-29 LAB — BASIC METABOLIC PANEL WITH GFR
BUN: 9 mg/dL (ref 6–23)
CO2: 27 mEq/L (ref 19–32)
Calcium: 9.7 mg/dL (ref 8.4–10.5)
Chloride: 102 mEq/L (ref 96–112)
Creat: 1.02 mg/dL (ref 0.50–1.35)
GFR, Est African American: >89 mL/min
GFR, Est Non African American: >89 mL/min
Glucose, Bld: 125 mg/dL — ABNORMAL HIGH (ref 70–99)
Potassium: 4.2 mEq/L (ref 3.5–5.3)
Sodium: 138 mEq/L (ref 135–145)

## 2014-08-29 LAB — LIPID PANEL
Cholesterol: 106 mg/dL (ref 0–200)
HDL: 35 mg/dL — ABNORMAL LOW (ref >=40)
LDL Cholesterol: 33 mg/dL (ref 0–99)
Total CHOL/HDL Ratio: 3 Ratio
Triglycerides: 189 mg/dL — ABNORMAL HIGH (ref <150)
VLDL: 38 mg/dL (ref 0–40)

## 2014-08-29 LAB — HEPATIC FUNCTION PANEL
ALT: 32 U/L (ref 0–53)
AST: 22 U/L (ref 0–37)
Albumin: 4.2 g/dL (ref 3.5–5.2)
Alkaline Phosphatase: 51 U/L (ref 39–117)
Bilirubin, Direct: 0.2 mg/dL (ref 0.0–0.3)
Indirect Bilirubin: 0.8 mg/dL (ref 0.2–1.2)
Total Bilirubin: 1 mg/dL (ref 0.2–1.2)
Total Protein: 6.6 g/dL (ref 6.0–8.3)

## 2014-08-29 LAB — TSH: TSH: 1.126 u[IU]/mL (ref 0.350–4.500)

## 2014-08-29 LAB — HEMOGLOBIN A1C
Hgb A1c MFr Bld: 5.2 % (ref <5.7)
Mean Plasma Glucose: 103 mg/dL (ref <117)

## 2014-08-29 LAB — VITAMIN D 25 HYDROXY (VIT D DEFICIENCY, FRACTURES): Vit D, 25-Hydroxy: 65 ng/mL (ref 30–100)

## 2014-08-29 LAB — TESTOSTERONE: Testosterone: 230 ng/dL — ABNORMAL LOW (ref 300–890)

## 2014-08-29 LAB — MAGNESIUM: Magnesium: 1.7 mg/dL (ref 1.5–2.5)

## 2014-08-29 LAB — INSULIN, FASTING: Insulin fasting, serum: 52.6 u[IU]/mL — ABNORMAL HIGH (ref 2.0–19.6)

## 2014-09-19 ENCOUNTER — Other Ambulatory Visit: Payer: Self-pay | Admitting: Internal Medicine

## 2014-11-14 ENCOUNTER — Ambulatory Visit (INDEPENDENT_AMBULATORY_CARE_PROVIDER_SITE_OTHER): Payer: BLUE CROSS/BLUE SHIELD | Admitting: Internal Medicine

## 2014-11-14 ENCOUNTER — Encounter: Payer: Self-pay | Admitting: Internal Medicine

## 2014-11-14 ENCOUNTER — Other Ambulatory Visit: Payer: Self-pay | Admitting: Internal Medicine

## 2014-11-14 VITALS — BP 110/64 | HR 116 | Temp 97.5°F | Resp 16 | Ht 69.5 in | Wt 235.0 lb

## 2014-11-14 DIAGNOSIS — E559 Vitamin D deficiency, unspecified: Secondary | ICD-10-CM

## 2014-11-14 DIAGNOSIS — I10 Essential (primary) hypertension: Secondary | ICD-10-CM

## 2014-11-14 DIAGNOSIS — Z6834 Body mass index (BMI) 34.0-34.9, adult: Secondary | ICD-10-CM

## 2014-11-14 DIAGNOSIS — E8881 Metabolic syndrome: Secondary | ICD-10-CM

## 2014-11-14 DIAGNOSIS — E782 Mixed hyperlipidemia: Secondary | ICD-10-CM

## 2014-11-14 DIAGNOSIS — E291 Testicular hypofunction: Secondary | ICD-10-CM

## 2014-11-14 DIAGNOSIS — Z79899 Other long term (current) drug therapy: Secondary | ICD-10-CM

## 2014-11-14 LAB — CBC WITH DIFFERENTIAL/PLATELET
Basophils Absolute: 0.1 10*3/uL (ref 0.0–0.1)
Basophils Relative: 1 % (ref 0–1)
Eosinophils Absolute: 0.4 10*3/uL (ref 0.0–0.7)
Eosinophils Relative: 3 % (ref 0–5)
HCT: 45.5 % (ref 39.0–52.0)
Hemoglobin: 16.3 g/dL (ref 13.0–17.0)
Lymphocytes Relative: 23 % (ref 12–46)
Lymphs Abs: 3 10*3/uL (ref 0.7–4.0)
MCH: 33.1 pg (ref 26.0–34.0)
MCHC: 35.8 g/dL (ref 30.0–36.0)
MCV: 92.3 fL (ref 78.0–100.0)
MPV: 8.3 fL — ABNORMAL LOW (ref 8.6–12.4)
Monocytes Absolute: 1.2 10*3/uL — ABNORMAL HIGH (ref 0.1–1.0)
Monocytes Relative: 9 % (ref 3–12)
Neutro Abs: 8.3 10*3/uL — ABNORMAL HIGH (ref 1.7–7.7)
Neutrophils Relative %: 64 % (ref 43–77)
Platelets: 441 10*3/uL — ABNORMAL HIGH (ref 150–400)
RBC: 4.93 MIL/uL (ref 4.22–5.81)
RDW: 13.1 % (ref 11.5–15.5)
WBC: 12.9 10*3/uL — ABNORMAL HIGH (ref 4.0–10.5)

## 2014-11-14 NOTE — Progress Notes (Signed)
Patient ID: Noah Dennis, male   DOB: 03/08/1971, 44 y.o.   MRN: [id number]    This very nice 44 y.o. SWM presents for quarterly follow up with Hypertension, Hyperlipidemia, Pre-Diabetes, Testosterone and Vitamin D Deficiency.     Patient is treated for HTN since 2005 & BP has been controlled at home. Today's BP: 110/64 mmHg. Patient has had no complaints of any cardiac type chest pain, palpitations, dyspnea/orthopnea/PND, dizziness, claudication, or dependent edema.    Hyperlipidemia is controlled with diet & meds. Patient denies myalgias or other med SE's. Last Lipids were at goal - Cholesterol 106; HDL 35*; LDL 33; Triglycerides 189 on 08/28/2014.    Also, the patient has history of Morbid Obesity (BMI 34.31) and consequent PreDiabetes/Insulin Resistance and has had no symptoms of reactive hypoglycemia, diabetic polys, paresthesias or visual blurring.  Last A1c was 5.2% and insulin elevated at 52 on 08/28/2014.    Patient has Low Testosterone 185.11 in Nov 2013 and is on replacement therapy.  Further, the patient also has history of Vitamin D Deficiency and supplements vitamin D without any suspected side-effects. Last vitamin D was 65 on 08/28/2014.  Medication Sig  . ANDROGEL PUMP 20.25 MG/ACT (1.62%) GEL APPLY 4 PUMPS DAILY AS DIRECTED.  Marland Kitchen. Cholecalciferol (VITAMIN D PO) Take 5,000 mg by mouth 2 (two) times daily.  . CRESTOR 40 MG tablet TAKE ONE TABLET BY MOUTH ONE TIME DAILY FOR CHOLESTEROL.  Marland Kitchen. doxycycline (VIBRAMYCIN) 100 MG capsule TAKE ONE CAPSULE BY MOUTH TWICE DAILY WITH FOOD for rosacea  . finasteride (PROSCAR) 5 MG tablet TAKE ONE TABLET BY MOUTH ONE TIME DAILY  . lisinopril (PRINIVIL,ZESTRIL) 20 MG tablet TAKE ONE TABLET BY MOUTH ONE TIME DAILY   . hydrocortisone 2.5 % lotion Apply to affected area 2 times daily  . phentermine (ADIPEX-P) 37.5 MG tablet Take 1 tablet (37.5 mg total) by mouth every morning. (Patient not taking: Reported on 11/14/2014)   No Known Allergies  PMHx:   Past  Medical History  Diagnosis Date  . Hypertension   . Hyperlipidemia   . Hypogonadism male   . Insulin resistance   . Pre-diabetes    Immunization History  Administered Date(s) Administered  . PPD Test 05/08/2013, 05/09/2014  . Pneumococcal Polysaccharide-23 01/08/2010, 05/09/2014  . Td 10/20/2004  . Tdap 05/09/2014   Past Surgical History  Procedure Laterality Date  . Splenectomy     FHx:    Reviewed / unchanged  SHx:    Reviewed / unchanged  Systems Review:  Constitutional: Denies fever, chills, wt changes, headaches, insomnia, fatigue, night sweats, change in appetite. Eyes: Denies redness, blurred vision, diplopia, discharge, itchy, watery eyes.  ENT: Denies discharge, congestion, post nasal drip, epistaxis, sore throat, earache, hearing loss, dental pain, tinnitus, vertigo, sinus pain, snoring.  CV: Denies chest pain, palpitations, irregular heartbeat, syncope, dyspnea, diaphoresis, orthopnea, PND, claudication or edema. Respiratory: denies cough, dyspnea, DOE, pleurisy, hoarseness, laryngitis, wheezing.  Gastrointestinal: Denies dysphagia, odynophagia, heartburn, reflux, water brash, abdominal pain or cramps, nausea, vomiting, bloating, diarrhea, constipation, hematemesis, melena, hematochezia  or hemorrhoids. Genitourinary: Denies dysuria, frequency, urgency, nocturia, hesitancy, discharge, hematuria or flank pain. Musculoskeletal: Denies arthralgias, myalgias, stiffness, jt. swelling, pain, limping or strain/sprain.  Skin: Denies pruritus, rash, hives, warts, acne, eczema or change in skin lesion(s). Neuro: No weakness, tremor, incoordination, spasms, paresthesia or pain. Psychiatric: Denies confusion, memory loss or sensory loss. Endo: Denies change in weight, skin or hair change.  Heme/Lymph: No excessive bleeding, bruising or enlarged lymph nodes.  Physical Exam  BP 110/64   Pulse 116  Temp 97.5 F   Resp 16  Ht 5' 9.5"   Wt 235 lb     BMI 34.22  Appears over  nourished and in no distress. Eyes: PERRLA, EOMs, conjunctiva no swelling or erythema. Sinuses: No frontal/maxillary tenderness ENT/Mouth: EAC's clear, TM's nl w/o erythema, bulging. Nares clear w/o erythema, swelling, exudates. Oropharynx clear without erythema or exudates. Oral hygiene is good. Tongue normal, non obstructing. Hearing intact.  Neck: Supple. Thyroid nl. Car 2+/2+ without bruits, nodes or JVD. Chest: Respirations nl with BS clear & equal w/o rales, rhonchi, wheezing or stridor.  Cor: Heart sounds normal w/ regular rate and rhythm without sig. murmurs, gallops, clicks, or rubs. Peripheral pulses normal and equal  without edema.  Abdomen: Soft & bowel sounds normal. Non-tender w/o guarding, rebound, hernias, masses, or organomegaly.  Lymphatics: Unremarkable.  Musculoskeletal: Full ROM all peripheral extremities, joint stability, 5/5 strength, and normal gait.  Skin: Warm, dry without exposed rashes, lesions or ecchymosis apparent.  Neuro: Cranial nerves intact, reflexes equal bilaterally. Sensory-motor testing grossly intact. Tendon reflexes grossly intact.  Pysch: Alert & oriented x 3.  Insight and judgement nl & appropriate. No ideations.  Assessment and Plan:  1. Essential hypertension  - TSH  2. Mixed hyperlipidemia  - Lipid panel  3. Insulin resistance  - Hemoglobin A1c - Insulin, random  4. Vitamin D deficiency  - Vit D  25 hydroxy   5. Encounter for long-term (current) use of medications  - CBC with Differential/Platelet - BASIC METABOLIC PANEL WITH GFR - Hepatic function panel - Magnesium  6. BMI 34.0-34.9,adult  7. Low T Syndrome   Recommended regular exercise, BP monitoring, weight control, and discussed med and SE's. Recommended labs to assess and monitor clinical status. Further disposition pending results of labs. Over 30 minutes of exam, counseling, chart review was performed

## 2014-11-14 NOTE — Patient Instructions (Signed)

## 2014-11-15 LAB — TESTOSTERONE: Testosterone: 182 ng/dL — ABNORMAL LOW (ref 300–890)

## 2014-11-15 LAB — HEPATIC FUNCTION PANEL
ALT: 34 U/L (ref 0–53)
AST: 28 U/L (ref 0–37)
Albumin: 4.3 g/dL (ref 3.5–5.2)
Alkaline Phosphatase: 48 U/L (ref 39–117)
Bilirubin, Direct: 0.3 mg/dL (ref 0.0–0.3)
Indirect Bilirubin: 0.9 mg/dL (ref 0.2–1.2)
Total Bilirubin: 1.2 mg/dL (ref 0.2–1.2)
Total Protein: 6.7 g/dL (ref 6.0–8.3)

## 2014-11-15 LAB — BASIC METABOLIC PANEL WITH GFR
BUN: 11 mg/dL (ref 6–23)
CO2: 22 mEq/L (ref 19–32)
Calcium: 9.5 mg/dL (ref 8.4–10.5)
Chloride: 101 mEq/L (ref 96–112)
Creat: 0.98 mg/dL (ref 0.50–1.35)
GFR, Est African American: >89 mL/min
GFR, Est Non African American: >89 mL/min
Glucose, Bld: 182 mg/dL — ABNORMAL HIGH (ref 70–99)
Potassium: 4.3 mEq/L (ref 3.5–5.3)
Sodium: 139 mEq/L (ref 135–145)

## 2014-11-15 LAB — VITAMIN D 25 HYDROXY (VIT D DEFICIENCY, FRACTURES): Vit D, 25-Hydroxy: 60 ng/mL (ref 30–100)

## 2014-11-15 LAB — LIPID PANEL
Cholesterol: 129 mg/dL (ref 0–200)
HDL: 39 mg/dL — ABNORMAL LOW (ref >=40)
LDL Cholesterol: 66 mg/dL (ref 0–99)
Total CHOL/HDL Ratio: 3.3 Ratio
Triglycerides: 118 mg/dL (ref <150)
VLDL: 24 mg/dL (ref 0–40)

## 2014-11-15 LAB — HEMOGLOBIN A1C
Hgb A1c MFr Bld: 5.5 % (ref <5.7)
Mean Plasma Glucose: 111 mg/dL (ref <117)

## 2014-11-15 LAB — INSULIN, RANDOM: Insulin: 49.4 u[IU]/mL — ABNORMAL HIGH (ref 2.0–19.6)

## 2014-11-15 LAB — MAGNESIUM: Magnesium: 1.6 mg/dL (ref 1.5–2.5)

## 2014-11-15 LAB — TSH: TSH: 1.526 u[IU]/mL (ref 0.350–4.500)

## 2014-12-08 ENCOUNTER — Other Ambulatory Visit: Payer: Self-pay | Admitting: Internal Medicine

## 2014-12-08 DIAGNOSIS — I10 Essential (primary) hypertension: Secondary | ICD-10-CM

## 2014-12-13 ENCOUNTER — Other Ambulatory Visit: Payer: Self-pay | Admitting: Internal Medicine

## 2015-02-08 ENCOUNTER — Other Ambulatory Visit: Payer: Self-pay | Admitting: Internal Medicine

## 2015-02-08 DIAGNOSIS — E349 Endocrine disorder, unspecified: Secondary | ICD-10-CM

## 2015-02-09 MED ORDER — TESTOSTERONE 20.25 MG/ACT (1.62%) TD GEL: TRANSDERMAL | Status: DC

## 2015-03-18 ENCOUNTER — Other Ambulatory Visit: Payer: Self-pay | Admitting: Internal Medicine

## 2015-04-07 ENCOUNTER — Other Ambulatory Visit: Payer: Self-pay | Admitting: Internal Medicine

## 2015-05-27 ENCOUNTER — Encounter: Payer: Self-pay | Admitting: Internal Medicine

## 2015-05-27 ENCOUNTER — Ambulatory Visit (INDEPENDENT_AMBULATORY_CARE_PROVIDER_SITE_OTHER): Payer: BLUE CROSS/BLUE SHIELD | Admitting: Internal Medicine

## 2015-05-27 ENCOUNTER — Other Ambulatory Visit: Payer: Self-pay | Admitting: Internal Medicine

## 2015-05-27 VITALS — BP 120/84 | HR 80 | Temp 97.9°F | Resp 16 | Ht 69.5 in | Wt 230.8 lb

## 2015-05-27 DIAGNOSIS — E782 Mixed hyperlipidemia: Secondary | ICD-10-CM

## 2015-05-27 DIAGNOSIS — E559 Vitamin D deficiency, unspecified: Secondary | ICD-10-CM | POA: Diagnosis not present

## 2015-05-27 DIAGNOSIS — Z79899 Other long term (current) drug therapy: Secondary | ICD-10-CM | POA: Diagnosis not present

## 2015-05-27 DIAGNOSIS — Z1212 Encounter for screening for malignant neoplasm of rectum: Secondary | ICD-10-CM

## 2015-05-27 DIAGNOSIS — E8881 Metabolic syndrome: Secondary | ICD-10-CM

## 2015-05-27 DIAGNOSIS — R5383 Other fatigue: Secondary | ICD-10-CM

## 2015-05-27 DIAGNOSIS — Z111 Encounter for screening for respiratory tuberculosis: Secondary | ICD-10-CM

## 2015-05-27 DIAGNOSIS — Z0000 Encounter for general adult medical examination without abnormal findings: Secondary | ICD-10-CM

## 2015-05-27 DIAGNOSIS — Z125 Encounter for screening for malignant neoplasm of prostate: Secondary | ICD-10-CM

## 2015-05-27 DIAGNOSIS — I10 Essential (primary) hypertension: Secondary | ICD-10-CM | POA: Diagnosis not present

## 2015-05-27 DIAGNOSIS — E349 Endocrine disorder, unspecified: Secondary | ICD-10-CM

## 2015-05-27 DIAGNOSIS — Z0001 Encounter for general adult medical examination with abnormal findings: Secondary | ICD-10-CM

## 2015-05-27 LAB — CBC WITH DIFFERENTIAL/PLATELET
Basophils Absolute: 0.1 10*3/uL (ref 0.0–0.1)
Basophils Relative: 1 % (ref 0–1)
Eosinophils Absolute: 0.5 10*3/uL (ref 0.0–0.7)
Eosinophils Relative: 4 % (ref 0–5)
HCT: 48.9 % (ref 39.0–52.0)
Hemoglobin: 17.7 g/dL — ABNORMAL HIGH (ref 13.0–17.0)
Lymphocytes Relative: 22 % (ref 12–46)
Lymphs Abs: 2.7 10*3/uL (ref 0.7–4.0)
MCH: 33.6 pg (ref 26.0–34.0)
MCHC: 36.2 g/dL — ABNORMAL HIGH (ref 30.0–36.0)
MCV: 92.8 fL (ref 78.0–100.0)
MPV: 8.6 fL (ref 8.6–12.4)
Monocytes Absolute: 1.2 10*3/uL — ABNORMAL HIGH (ref 0.1–1.0)
Monocytes Relative: 10 % (ref 3–12)
Neutro Abs: 7.6 10*3/uL (ref 1.7–7.7)
Neutrophils Relative %: 63 % (ref 43–77)
Platelets: 479 10*3/uL — ABNORMAL HIGH (ref 150–400)
RBC: 5.27 MIL/uL (ref 4.22–5.81)
RDW: 12.9 % (ref 11.5–15.5)
WBC: 12.1 10*3/uL — ABNORMAL HIGH (ref 4.0–10.5)

## 2015-05-27 LAB — BASIC METABOLIC PANEL WITH GFR
BUN: 10 mg/dL (ref 7–25)
CO2: 30 mmol/L (ref 20–31)
Calcium: 10 mg/dL (ref 8.6–10.3)
Chloride: 99 mmol/L (ref 98–110)
Creat: 0.98 mg/dL (ref 0.60–1.35)
GFR, Est African American: >89 mL/min (ref >=60)
GFR, Est Non African American: >89 mL/min (ref >=60)
Glucose, Bld: 110 mg/dL — ABNORMAL HIGH (ref 65–99)
Potassium: 4.4 mmol/L (ref 3.5–5.3)
Sodium: 138 mmol/L (ref 135–146)

## 2015-05-27 LAB — TSH: TSH: 1.878 u[IU]/mL (ref 0.350–4.500)

## 2015-05-27 LAB — URINALYSIS, ROUTINE W REFLEX MICROSCOPIC
Bilirubin Urine: NEGATIVE
Glucose, UA: NEGATIVE
Hgb urine dipstick: NEGATIVE
Ketones, ur: NEGATIVE
Leukocytes, UA: NEGATIVE
Nitrite: NEGATIVE
Protein, ur: NEGATIVE
Specific Gravity, Urine: 1.023 (ref 1.001–1.035)
pH: 5.5 (ref 5.0–8.0)

## 2015-05-27 LAB — LIPID PANEL
Cholesterol: 132 mg/dL (ref 125–200)
HDL: 36 mg/dL — ABNORMAL LOW (ref >=40)
LDL Cholesterol: 54 mg/dL (ref <130)
Total CHOL/HDL Ratio: 3.7 Ratio (ref <=5.0)
Triglycerides: 211 mg/dL — ABNORMAL HIGH (ref <150)
VLDL: 42 mg/dL — ABNORMAL HIGH (ref <30)

## 2015-05-27 LAB — HEPATIC FUNCTION PANEL
ALT: 31 U/L (ref 9–46)
AST: 23 U/L (ref 10–40)
Albumin: 4.5 g/dL (ref 3.6–5.1)
Alkaline Phosphatase: 57 U/L (ref 40–115)
Bilirubin, Direct: 0.2 mg/dL (ref <=0.2)
Indirect Bilirubin: 0.8 mg/dL (ref 0.2–1.2)
Total Bilirubin: 1 mg/dL (ref 0.2–1.2)
Total Protein: 7 g/dL (ref 6.1–8.1)

## 2015-05-27 LAB — MAGNESIUM: Magnesium: 1.7 mg/dL (ref 1.5–2.5)

## 2015-05-27 LAB — HEMOGLOBIN A1C
Hgb A1c MFr Bld: 5.5 % (ref <5.7)
Mean Plasma Glucose: 111 mg/dL (ref <117)

## 2015-05-27 LAB — IRON AND TIBC
%SAT: 37 % (ref 15–60)
Iron: 117 ug/dL (ref 50–180)
TIBC: 317 ug/dL (ref 250–425)
UIBC: 200 ug/dL (ref 125–400)

## 2015-05-27 LAB — VITAMIN B12: Vitamin B-12: 732 pg/mL (ref 211–911)

## 2015-05-27 NOTE — Patient Instructions (Signed)

## 2015-05-27 NOTE — Progress Notes (Signed)
Patient ID: Noah Dennis, male   DOB: 04/08/1971, 45 y.o.   MRN: [id number]  Annual  Screening/Preventative Visit And Comprehensive Evaluation & Examination     This very nice 45 y.o. single WM presents for presents for a Wellness/Preventative Visit & comprehensive evaluation and management of multiple medical co-morbidities.  Patient has been followed for HTN, Prediabetes, Hyperlipidemia, Testosterone and Vitamin D Deficiency.     HTN predates since 2005. Patient's BP has been controlled at home.Today's BP: 120/84 mmHg. Patient denies any cardiac symptoms as chest pain, palpitations, shortness of breath, dizziness or ankle swelling.     Patient's hyperlipidemia is controlled with diet and medications. Patient denies myalgias or other medication SE's. Last lipids were at goal with Cholesterol 129; HDL 39*; LDL 66; Triglycerides 118 on 11/14/2014.     Patient has prediabetes/insulin resistance since July 2011 with an A1c 4.8% and elevated insulin 77  and patient denies reactive hypoglycemic symptoms, visual blurring, diabetic polys or paresthesias. Last A1c was 5.5% with elevated insulin 49 on 11/14/2014.       Patient has Testosterone Deficiency with a value 239 in Jan 2015.  Finally, patient has history of Vitamin D Deficiency and last vitamin D was 60 on 11/14/2014.    Medication Sig  . VITAMIN D Take 5,000 mg by mouth 2 (two) times daily.  . finasteride  5 MG tablet TAKE ONE TABLET BY MOUTH ONE TIME DAILY  . lisinopril  20 MG tablet TAKE ONE TABLET BY MOUTH ONE TIME DAILY  . rosuvastatin  40 MG tablet TAKE ONE TABLET BY MOUTH ONE TIME DAILY FOR CHOLESTEROL  . ANDROGEL PUMP (1.62%) GEL Apply 4 pumps daily  . hydrocortisone 2.5 % lotion Apply to affected area 2 times daily   No Known Allergies   Past Medical History  Diagnosis Date  . Hypertension   . Hyperlipidemia   . Hypogonadism male   . Insulin resistance   . Pre-diabetes    Health Maintenance  Topic Date Due  . INFLUENZA VACCINE   11/26/2014  . TETANUS/TDAP  05/09/2024  . HIV Screening  Completed   Immunization History  Administered Date(s) Administered  . PPD Test 05/08/2013, 05/09/2014  . Pneumococcal Polysaccharide-23 01/08/2010, 05/09/2014  . Td 10/20/2004  . Tdap 05/09/2014   Past Surgical History  Procedure Laterality Date  . Splenectomy     Family History  Problem Relation Age of Onset  . Diabetes Mother   . Hypertension Mother   . Heart disease Mother   . Hypertension Father   . Cancer Father     Social History   Social History  . Marital Status: Single    Spouse Name: N/A  . Number of Children: N/A  . Years of Education: N/A   Occupational History  . Not on file.   Social History Main Topics  . Smoking status: Never Smoker   . Smokeless tobacco: Not on file  . Alcohol Use: Yes     Comment: occasionally  . Drug Use: No  . Sexual Activity: Not on file   Other Topics Concern  . Not on file   Social History Narrative    ROS Constitutional: Denies fever, chills, weight loss/gain, headaches, insomnia,  night sweats or change in appetite. Does c/o fatigue. Eyes: Denies redness, blurred vision, diplopia, discharge, itchy or watery eyes.  ENT: Denies discharge, congestion, post nasal drip, epistaxis, sore throat, earache, hearing loss, dental pain, Tinnitus, Vertigo, Sinus pain or snoring.  Cardio: Denies chest pain, palpitations, irregular heartbeat,  syncope, dyspnea, diaphoresis, orthopnea, PND, claudication or edema Respiratory: denies cough, dyspnea, DOE, pleurisy, hoarseness, laryngitis or wheezing.  Gastrointestinal: Denies dysphagia, heartburn, reflux, water brash, pain, cramps, nausea, vomiting, bloating, diarrhea, constipation, hematemesis, melena, hematochezia, jaundice or hemorrhoids Genitourinary: Denies dysuria, frequency, urgency, nocturia, hesitancy, discharge, hematuria or flank pain Musculoskeletal: Denies arthralgia, myalgia, stiffness, Jt. Swelling, pain, limp or  strain/sprain. Denies Falls. Skin: Denies puritis, rash, hives, warts, acne, eczema or change in skin lesion Neuro: No weakness, tremor, incoordination, spasms, paresthesia or pain Psychiatric: Denies confusion, memory loss or sensory loss. Denies Depression. Endocrine: Denies change in weight, skin, hair change, nocturia, and paresthesia, diabetic polys, visual blurring or hyper / hypo glycemic episodes.  Heme/Lymph: No excessive bleeding, bruising or enlarged lymph nodes.  Physical Exam  BP 120/84 mmHg  Pulse 80  Temp(Src) 97.9 F (36.6 C)  Resp 16  Ht 5' 9.5" (1.765 m)  Wt 230 lb 12.8 oz (104.69 kg)  BMI 33.61 kg/m2  General Appearance: Well nourished, in no apparent distress. Eyes: PERRLA, EOMs, conjunctiva no swelling or erythema, normal fundi and vessels. Sinuses: No frontal/maxillary tenderness ENT/Mouth: EACs patent / TMs  nl. Nares clear without erythema, swelling, mucoid exudates. Oral hygiene is good. No erythema, swelling, or exudate. Tongue normal, non-obstructing. Tonsils not swollen or erythematous. Hearing normal.  Neck: Supple, thyroid normal. No bruits, nodes or JVD. Respiratory: Respiratory effort normal.  BS equal and clear bilateral without rales, rhonci, wheezing or stridor. Cardio: Heart sounds are normal with regular rate and rhythm and no murmurs, rubs or gallops. Peripheral pulses are normal and equal bilaterally without edema. No aortic or femoral bruits. Chest: symmetric with normal excursions and percussion.  Abdomen: Soft, with Nl bowel sounds. Nontender, no guarding, rebound, hernias, masses, or organomegaly.  Lymphatics: Non tender without lymphadenopathy.  Genitourinary: No hernias.Testes nl. DRE - prostate nl for age - smooth & firm w/o nodules. Musculoskeletal: Full ROM all peripheral extremities, joint stability, 5/5 strength, and normal gait. Skin: Warm and dry without rashes, lesions, cyanosis, clubbing or  ecchymosis.  Neuro: Cranial nerves  intact, reflexes equal bilaterally. Normal muscle tone, no cerebellar symptoms. Sensation intact.  Pysch: Alert and oriented X 3 with normal affect, insight and judgment appropriate.   Assessment and Plan  1. Annual Preventative/Screening Exam    - Microalbumin / creatinine urine ratio - EKG 12-Lead - Urinalysis, Routine w reflex microscopic - Vitamin B12 - Iron and TIBC - PSA - Testosterone - CBC with Differential/Platelet - BASIC METABOLIC PANEL WITH GFR - Hepatic function panel - Magnesium - Lipid panel - TSH - Hemoglobin A1c - Insulin, random - VITAMIN D 25 Hydroxy (  2. Essential hypertension  - Microalbumin / creatinine urine ratio - EKG 12-Lead - TSH  3. Mixed hyperlipidemia  - Lipid panel  4. Insulin resistance  - Hemoglobin A1c - Insulin, random  5. Vitamin D deficiency  - VITAMIN D 25 Hydroxy   6. Testosterone deficiency   7. Morbid obesity (HCC)   8. Screening for rectal cancer   9. Prostate cancer screening  - PSA  10. Other fatigue  - Vitamin B12 - Iron and TIBC - Testosterone - CBC with Differential/Platelet - TSH  11. Medication management  - Urinalysis, Routine w reflex microscopic  - CBC with Differential/Platelet - BASIC METABOLIC PANEL WITH GFR - Hepatic function panel - Magnesium   Continue prudent diet as discussed, weight control, BP monitoring, regular exercise, and medications as discussed.  Discussed med effects and SE's. Routine screening labs and  tests as requested with regular follow-up as recommended. Over 40 minutes of exam, counseling, chart review and high complex critical decision making was performed

## 2015-05-28 LAB — PSA: PSA: 0.32 ng/mL (ref <=4.00)

## 2015-05-28 LAB — TESTOSTERONE: Testosterone: 291 ng/dL (ref 250–827)

## 2015-05-28 LAB — VITAMIN D 25 HYDROXY (VIT D DEFICIENCY, FRACTURES): Vit D, 25-Hydroxy: 58 ng/mL (ref 30–100)

## 2015-05-28 LAB — MICROALBUMIN / CREATININE URINE RATIO
Creatinine, Urine: 265 mg/dL (ref 20–370)
Microalb Creat Ratio: 2 mcg/mg creat (ref <30)
Microalb, Ur: 0.4 mg/dL

## 2015-05-28 LAB — INSULIN, RANDOM: Insulin: 24.1 u[IU]/mL — ABNORMAL HIGH (ref 2.0–19.6)

## 2015-05-29 LAB — TB SKIN TEST
Induration: 0 mm
TB Skin Test: NEGATIVE

## 2015-05-30 ENCOUNTER — Other Ambulatory Visit: Payer: Self-pay | Admitting: *Deleted

## 2015-05-30 MED ORDER — DOXYCYCLINE HYCLATE 100 MG PO CAPS: 100.0000 mg | ORAL_CAPSULE | Freq: Two times a day (BID) | ORAL | Status: DC

## 2015-06-05 ENCOUNTER — Ambulatory Visit (INDEPENDENT_AMBULATORY_CARE_PROVIDER_SITE_OTHER): Payer: Self-pay | Admitting: *Deleted

## 2015-06-05 DIAGNOSIS — Z23 Encounter for immunization: Secondary | ICD-10-CM

## 2015-06-07 ENCOUNTER — Telehealth: Payer: Self-pay | Admitting: *Deleted

## 2015-06-07 MED ORDER — PREDNISONE 20 MG PO TABS: ORAL_TABLET | ORAL | Status: DC

## 2015-06-07 NOTE — Telephone Encounter (Signed)
Patient received Zostavax 06/05/15 and was advised to call back if he noticed any increased redness, swelling, blistering at injection site.  Patient states he is noticing increased redness at injection site and is concerned symptoms will increase over the weekend.  Per [name], PA-C rx for prednisone will be sent into pharmacy for patient to take and advised him to call the on call number or go to urgent care this weekend if any major increase in symptoms.

## 2015-06-13 ENCOUNTER — Other Ambulatory Visit: Payer: Self-pay | Admitting: Internal Medicine

## 2015-06-13 ENCOUNTER — Encounter: Payer: Self-pay | Admitting: Internal Medicine

## 2015-06-13 ENCOUNTER — Ambulatory Visit (INDEPENDENT_AMBULATORY_CARE_PROVIDER_SITE_OTHER): Payer: BLUE CROSS/BLUE SHIELD | Admitting: Internal Medicine

## 2015-06-13 VITALS — BP 114/60 | HR 112 | Temp 97.5°F | Resp 16 | Ht 69.5 in | Wt 230.8 lb

## 2015-06-13 DIAGNOSIS — L918 Other hypertrophic disorders of the skin: Secondary | ICD-10-CM

## 2015-06-13 DIAGNOSIS — I10 Essential (primary) hypertension: Secondary | ICD-10-CM

## 2015-06-13 DIAGNOSIS — H02826 Cysts of left eye, unspecified eyelid: Secondary | ICD-10-CM

## 2015-06-13 NOTE — Progress Notes (Signed)
  Subjective:    Patient ID: Noah Dennis, male    DOB: [birth year], 45 y.o.   MRN: [id number]  HPI  Very nice 45 yo SWM with HTN presents for f/u and also has concerns re: a cyst at the medial upper left eyelid. HT sx's are negative w/ith no c/o HA's , dizziness, CP, palpitations, PND/Orthopnea, or dependent edema.   Medication Sig  . Cholecalciferol (VITAMIN D PO) Take 5,000 mg by mouth 2 (two) times daily.  Marland Kitchen doxycycline (VIBRAMYCIN) 100 MG capsule Take 1 capsule (100 mg total) by mouth 2 (two) times daily. Takes for rosacea.  . finasteride (PROSCAR) 5 MG tablet TAKE ONE TABLET BY MOUTH ONE TIME DAILY  . lisinopril (PRINIVIL,ZESTRIL) 20 MG tablet TAKE ONE TABLET BY MOUTH ONE TIME DAILY  . rosuvastatin (CRESTOR) 40 MG tablet TAKE ONE TABLET BY MOUTH ONE TIME DAILY FOR CHOLESTEROL  . Testosterone (ANDROGEL PUMP) 20.25 MG/ACT (1.62%) GEL Apply 4 pumps daily  . hydrocortisone 2.5 % lotion Apply to affected area 2 times daily   No Known Allergies   Past Medical History  Diagnosis Date  . Hypertension   . Hyperlipidemia   . Hypogonadism male   . Insulin resistance   . Pre-diabetes    Review of Systems 10 point systems review negative except as above.    Objective:   Physical Exam  BP 114/60 mmHg  Pulse 112  Temp(Src) 97.5 F (36.4 C)  Resp 16  Ht 5' 9.5" (1.765 m)  Wt 230 lb 12.8 oz (104.69 kg)  BMI 33.61 kg/m2  HEENT - Eac's patent. TM's Nl. EOM's full. PERRLA. NasoOroPharynx clear. Neck - supple. Nl Thyroid. Carotids 2+ & No bruits, nodes, JVD Chest - Clear equal BS w/o Rales, rhonchi, wheezes. Cor - Nl HS. RRR w/o sig MGR. PP 1(+). No edema. Abd - No palpable organomegaly, masses or tenderness. BS nl. MS- FROM w/o deformities. Muscle power, tone and bulk Nl. Gait Nl. Neuro - No obvious Cr N abnormalities. Sensory, motor and Cerebellar functions appear Nl w/o focal abnormalities. Psyche - Mental status normal & appropriate.  No delusions, ideations or obvious mood  abnormalities.  There is a 4 x 4 mm sebaceous cyst protruding at the meial margin of the L upper eyelid.   Procedure (CPT - 10060):  After informed consent and prep with alcohol the area was anesthetized locally with 0.2 ml of Marcaine 0.5% w/epi. Then with a # 11 scalpel a 3 mm incision was made into the cyst and then the cyst contents were expressed and hemostasis was achieved with pressure.   Patient was instructed in po wound care & to call if questions or problems.    Assessment & Plan:   1. Essential hypertension   2. Sebaceous cyst of eyelid, left

## 2015-06-25 ENCOUNTER — Other Ambulatory Visit: Payer: Self-pay | Admitting: Internal Medicine

## 2015-08-25 ENCOUNTER — Other Ambulatory Visit: Payer: Self-pay | Admitting: Internal Medicine

## 2015-08-25 DIAGNOSIS — E349 Endocrine disorder, unspecified: Secondary | ICD-10-CM

## 2015-08-26 NOTE — Telephone Encounter (Signed)
Rx called into [facility] in [place].

## 2015-08-27 ENCOUNTER — Ambulatory Visit (INDEPENDENT_AMBULATORY_CARE_PROVIDER_SITE_OTHER): Payer: BLUE CROSS/BLUE SHIELD | Admitting: Internal Medicine

## 2015-08-27 ENCOUNTER — Encounter: Payer: Self-pay | Admitting: Internal Medicine

## 2015-08-27 VITALS — BP 110/62 | HR 108 | Temp 98.2°F | Resp 18 | Ht 69.5 in | Wt 226.0 lb

## 2015-08-27 DIAGNOSIS — Z79899 Other long term (current) drug therapy: Secondary | ICD-10-CM

## 2015-08-27 DIAGNOSIS — E291 Testicular hypofunction: Secondary | ICD-10-CM

## 2015-08-27 DIAGNOSIS — E782 Mixed hyperlipidemia: Secondary | ICD-10-CM | POA: Diagnosis not present

## 2015-08-27 DIAGNOSIS — I10 Essential (primary) hypertension: Secondary | ICD-10-CM | POA: Diagnosis not present

## 2015-08-27 DIAGNOSIS — E8881 Metabolic syndrome: Secondary | ICD-10-CM | POA: Diagnosis not present

## 2015-08-27 DIAGNOSIS — E559 Vitamin D deficiency, unspecified: Secondary | ICD-10-CM | POA: Diagnosis not present

## 2015-08-27 DIAGNOSIS — E349 Endocrine disorder, unspecified: Secondary | ICD-10-CM

## 2015-08-27 LAB — HEPATIC FUNCTION PANEL
ALT: 30 U/L (ref 9–46)
AST: 23 U/L (ref 10–40)
Albumin: 4.4 g/dL (ref 3.6–5.1)
Alkaline Phosphatase: 56 U/L (ref 40–115)
Bilirubin, Direct: 0.2 mg/dL (ref <=0.2)
Indirect Bilirubin: 0.8 mg/dL (ref 0.2–1.2)
Total Bilirubin: 1 mg/dL (ref 0.2–1.2)
Total Protein: 6.6 g/dL (ref 6.1–8.1)

## 2015-08-27 LAB — LIPID PANEL
Cholesterol: 122 mg/dL — ABNORMAL LOW (ref 125–200)
HDL: 45 mg/dL (ref >=40)
LDL Cholesterol: 46 mg/dL (ref <130)
Total CHOL/HDL Ratio: 2.7 Ratio (ref <=5.0)
Triglycerides: 153 mg/dL — ABNORMAL HIGH (ref <150)
VLDL: 31 mg/dL — ABNORMAL HIGH (ref <30)

## 2015-08-27 LAB — TSH: TSH: 2.49 mIU/L (ref 0.40–4.50)

## 2015-08-27 LAB — CBC WITH DIFFERENTIAL/PLATELET
Basophils Absolute: 144 cells/uL (ref 0–200)
Basophils Relative: 1 %
Eosinophils Absolute: 432 cells/uL (ref 15–500)
Eosinophils Relative: 3 %
HCT: 46.1 % (ref 38.5–50.0)
Hemoglobin: 16.5 g/dL (ref 13.2–17.1)
Lymphocytes Relative: 17 %
Lymphs Abs: 2448 cells/uL (ref 850–3900)
MCH: 33.5 pg — ABNORMAL HIGH (ref 27.0–33.0)
MCHC: 35.8 g/dL (ref 32.0–36.0)
MCV: 93.5 fL (ref 80.0–100.0)
MPV: 8.7 fL (ref 7.5–12.5)
Monocytes Absolute: 1584 cells/uL — ABNORMAL HIGH (ref 200–950)
Monocytes Relative: 11 %
Neutro Abs: 9792 cells/uL — ABNORMAL HIGH (ref 1500–7800)
Neutrophils Relative %: 68 %
Platelets: 439 10*3/uL — ABNORMAL HIGH (ref 140–400)
RBC: 4.93 MIL/uL (ref 4.20–5.80)
RDW: 13.1 % (ref 11.0–15.0)
WBC: 14.4 10*3/uL — ABNORMAL HIGH (ref 3.8–10.8)

## 2015-08-27 LAB — BASIC METABOLIC PANEL WITH GFR
BUN: 15 mg/dL (ref 7–25)
CO2: 22 mmol/L (ref 20–31)
Calcium: 9.6 mg/dL (ref 8.6–10.3)
Chloride: 103 mmol/L (ref 98–110)
Creat: 1.08 mg/dL (ref 0.60–1.35)
GFR, Est African American: >89 mL/min (ref >=60)
GFR, Est Non African American: 83 mL/min (ref >=60)
Glucose, Bld: 90 mg/dL (ref 65–99)
Potassium: 4.6 mmol/L (ref 3.5–5.3)
Sodium: 136 mmol/L (ref 135–146)

## 2015-08-27 LAB — HEMOGLOBIN A1C
Hgb A1c MFr Bld: 5.4 % (ref <5.7)
Mean Plasma Glucose: 108 mg/dL

## 2015-08-27 LAB — TESTOSTERONE: Testosterone: 291 ng/dL (ref 250–827)

## 2015-08-27 MED ORDER — LISINOPRIL 20 MG PO TABS: 20.0000 mg | ORAL_TABLET | Freq: Every day | ORAL | Status: DC

## 2015-08-27 MED ORDER — FINASTERIDE 5 MG PO TABS: 5.0000 mg | ORAL_TABLET | Freq: Every day | ORAL | Status: DC

## 2015-08-27 MED ORDER — ROSUVASTATIN CALCIUM 40 MG PO TABS: ORAL_TABLET | ORAL | Status: DC

## 2015-08-27 MED ORDER — TERBINAFINE HCL 250 MG PO TABS: 250.0000 mg | ORAL_TABLET | Freq: Every day | ORAL | Status: DC

## 2015-08-27 MED ORDER — DOXYCYCLINE HYCLATE 100 MG PO CAPS: 100.0000 mg | ORAL_CAPSULE | Freq: Two times a day (BID) | ORAL | Status: DC

## 2015-08-27 NOTE — Progress Notes (Signed)
Assessment and Plan:  Hypertension:  -Continue medication,  -monitor blood pressure at home.  -Continue DASH diet.   -Reminder to go to the ER if any CP, SOB, nausea, dizziness, severe HA, changes vision/speech, left arm numbness and tingling, and jaw pain.  Cholesterol: -Continue diet and exercise.  -Check cholesterol.   Pre-diabetes: -Continue diet and exercise.  -Check A1C  Vitamin D Def: -check level -continue medications.   Testosterone Def -androgel -check testosterone level  Rosacea -doxycycline refilled  Onychomycosis -lamisil -LFTs Continue diet and meds as discussed. Further disposition pending results of labs.  HPI 45 y.o. male  presents for 3 month follow up with hypertension, hyperlipidemia, prediabetes and vitamin D.   His blood pressure has been controlled at home, today their BP is BP: 110/62 mmHg.   He does workout. He denies chest pain, shortness of breath, dizziness.   He is on cholesterol medication and denies myalgias. His cholesterol is at goal. The cholesterol last visit was:   Lab Results  Component Value Date   CHOL 132 05/27/2015   HDL 36* 05/27/2015   LDLCALC 54 05/27/2015   TRIG 211* 05/27/2015   CHOLHDL 3.7 05/27/2015     He has been working on diet and exercise for prediabetes, and denies foot ulcerations, hyperglycemia, hypoglycemia , increased appetite, nausea, paresthesia of the feet, polydipsia, polyuria, visual disturbances, vomiting and weight loss. Last A1C in the office was:  Lab Results  Component Value Date   HGBA1C 5.5 05/27/2015    Patient is on Vitamin D supplement.  Lab Results  Component Value Date   VD25OH 43 05/27/2015     Patient reports that he has been taking terbafine daily for a fungal infection of his right big toenail.  He feels minimal change in the toe nail.    He reports that he is still doing 4 pumps of androgel daily.    Current Medications:  Current Outpatient Prescriptions on File Prior to Visit   Medication Sig Dispense Refill  . ANDROGEL PUMP 20.25 MG/ACT (1.62%) GEL APPLY 4 PUMPS DAILY AS DIRECTED 150 g 1  . Cholecalciferol (VITAMIN D PO) Take 5,000 mg by mouth 2 (two) times daily.    Marland Kitchen doxycycline (VIBRAMYCIN) 100 MG capsule Take 1 capsule (100 mg total) by mouth 2 (two) times daily. Takes for rosacea. 60 capsule 5  . finasteride (PROSCAR) 5 MG tablet TAKE ONE TABLET BY MOUTH ONE TIME DAILY 90 tablet 1  . lisinopril (PRINIVIL,ZESTRIL) 20 MG tablet TAKE ONE TABLET BY MOUTH ONE TIME DAILY 90 tablet 1  . rosuvastatin (CRESTOR) 40 MG tablet TAKE ONE TABLET BY MOUTH ONE TIME DAILY FOR CHOLESTEROL 90 tablet 1   No current facility-administered medications on file prior to visit.    Medical History:  Past Medical History  Diagnosis Date  . Hypertension   . Hyperlipidemia   . Hypogonadism male   . Insulin resistance   . Pre-diabetes     Allergies: No Known Allergies   Review of Systems:  Review of Systems  Constitutional: Negative for fever, chills and malaise/fatigue.  HENT: Positive for congestion. Negative for ear pain and sore throat.   Eyes: Negative.   Respiratory: Negative for cough, shortness of breath and wheezing.   Cardiovascular: Negative for chest pain, palpitations and leg swelling.  Gastrointestinal: Negative for heartburn, diarrhea, constipation, blood in stool and melena.  Genitourinary: Negative.   Neurological: Negative for dizziness, loss of consciousness and headaches.  Psychiatric/Behavioral: Negative for depression. The patient is not nervous/anxious  and does not have insomnia.     Family history- Review and unchanged  Social history- Review and unchanged  Physical Exam: BP 110/62 mmHg  Pulse 108  Temp(Src) 98.2 F (36.8 C) (Temporal)  Resp 18  Ht 5' 9.5" (1.765 m)  Wt 226 lb (102.513 kg)  BMI 32.91 kg/m2 Wt Readings from Last 3 Encounters:  08/27/15 226 lb (102.513 kg)  06/13/15 230 lb 12.8 oz (104.69 kg)  05/27/15 230 lb 12.8 oz  (104.69 kg)    General Appearance: Well nourished well developed, in no apparent distress. Eyes: PERRLA, EOMs, conjunctiva no swelling or erythema ENT/Mouth: Ear canals normal without obstruction, swelling, erythma, discharge.  TMs normal bilaterally.  Oropharynx moist, clear, without exudate, or postoropharyngeal swelling. Neck: Supple, thyroid normal,no cervical adenopathy  Respiratory: Respiratory effort normal, Breath sounds clear A&P without rhonchi, wheeze, or rale.  No retractions, no accessory usage. Cardio: RRR with no MRGs. Brisk peripheral pulses without edema.  Abdomen: Soft, + BS,  Non tender, no guarding, rebound, hernias, masses. Musculoskeletal: Full ROM, 5/5 strength, Normal gait Skin: Warm, dry without rashes, lesions, ecchymosis.  Neuro: Awake and oriented X 3, Cranial nerves intact. Normal muscle tone, no cerebellar symptoms. Psych: Normal affect, Insight and Judgment appropriate.    Terri Piedraourtney Forcucci, PA-C 2:42 PM [facility]

## 2015-09-27 ENCOUNTER — Other Ambulatory Visit: Payer: Self-pay | Admitting: Internal Medicine

## 2015-12-02 ENCOUNTER — Encounter: Payer: Self-pay | Admitting: Internal Medicine

## 2015-12-02 ENCOUNTER — Ambulatory Visit (INDEPENDENT_AMBULATORY_CARE_PROVIDER_SITE_OTHER): Payer: BLUE CROSS/BLUE SHIELD | Admitting: Internal Medicine

## 2015-12-02 VITALS — BP 124/72 | HR 100 | Temp 97.5°F | Ht 69.5 in | Wt 231.8 lb

## 2015-12-02 DIAGNOSIS — E8881 Metabolic syndrome: Secondary | ICD-10-CM | POA: Diagnosis not present

## 2015-12-02 DIAGNOSIS — E559 Vitamin D deficiency, unspecified: Secondary | ICD-10-CM | POA: Diagnosis not present

## 2015-12-02 DIAGNOSIS — Z79899 Other long term (current) drug therapy: Secondary | ICD-10-CM | POA: Diagnosis not present

## 2015-12-02 DIAGNOSIS — I10 Essential (primary) hypertension: Secondary | ICD-10-CM | POA: Diagnosis not present

## 2015-12-02 DIAGNOSIS — E782 Mixed hyperlipidemia: Secondary | ICD-10-CM | POA: Diagnosis not present

## 2015-12-02 DIAGNOSIS — E291 Testicular hypofunction: Secondary | ICD-10-CM | POA: Diagnosis not present

## 2015-12-02 DIAGNOSIS — E349 Endocrine disorder, unspecified: Secondary | ICD-10-CM

## 2015-12-02 LAB — CBC WITH DIFFERENTIAL/PLATELET
Basophils Absolute: 125 cells/uL (ref 0–200)
Basophils Relative: 1 %
Eosinophils Absolute: 250 cells/uL (ref 15–500)
Eosinophils Relative: 2 %
HCT: 48.1 % (ref 38.5–50.0)
Hemoglobin: 17.1 g/dL (ref 13.2–17.1)
Lymphocytes Relative: 23 %
Lymphs Abs: 2875 cells/uL (ref 850–3900)
MCH: 33.8 pg — ABNORMAL HIGH (ref 27.0–33.0)
MCHC: 35.6 g/dL (ref 32.0–36.0)
MCV: 95.1 fL (ref 80.0–100.0)
MPV: 8.5 fL (ref 7.5–12.5)
Monocytes Absolute: 1250 cells/uL — ABNORMAL HIGH (ref 200–950)
Monocytes Relative: 10 %
Neutro Abs: 8000 cells/uL — ABNORMAL HIGH (ref 1500–7800)
Neutrophils Relative %: 64 %
Platelets: 470 10*3/uL — ABNORMAL HIGH (ref 140–400)
RBC: 5.06 MIL/uL (ref 4.20–5.80)
RDW: 12.7 % (ref 11.0–15.0)
WBC: 12.5 10*3/uL — ABNORMAL HIGH (ref 3.8–10.8)

## 2015-12-02 LAB — BASIC METABOLIC PANEL WITH GFR
BUN: 9 mg/dL (ref 7–25)
CO2: 24 mmol/L (ref 20–31)
Calcium: 9.8 mg/dL (ref 8.6–10.3)
Chloride: 100 mmol/L (ref 98–110)
Creat: 0.96 mg/dL (ref 0.60–1.35)
GFR, Est African American: >89 mL/min (ref >=60)
GFR, Est Non African American: >89 mL/min (ref >=60)
Glucose, Bld: 154 mg/dL — ABNORMAL HIGH (ref 65–99)
Potassium: 4.6 mmol/L (ref 3.5–5.3)
Sodium: 138 mmol/L (ref 135–146)

## 2015-12-02 LAB — HEPATIC FUNCTION PANEL
ALT: 26 U/L (ref 9–46)
AST: 20 U/L (ref 10–40)
Albumin: 4.4 g/dL (ref 3.6–5.1)
Alkaline Phosphatase: 53 U/L (ref 40–115)
Bilirubin, Direct: 0.1 mg/dL (ref <=0.2)
Indirect Bilirubin: 0.6 mg/dL (ref 0.2–1.2)
Total Bilirubin: 0.7 mg/dL (ref 0.2–1.2)
Total Protein: 6.8 g/dL (ref 6.1–8.1)

## 2015-12-02 LAB — LIPID PANEL
Cholesterol: 155 mg/dL (ref 125–200)
HDL: 63 mg/dL (ref >=40)
LDL Cholesterol: 56 mg/dL (ref <130)
Total CHOL/HDL Ratio: 2.5 Ratio (ref <=5.0)
Triglycerides: 181 mg/dL — ABNORMAL HIGH (ref <150)
VLDL: 36 mg/dL — ABNORMAL HIGH (ref <30)

## 2015-12-02 LAB — MAGNESIUM: Magnesium: 1.7 mg/dL (ref 1.5–2.5)

## 2015-12-02 LAB — TSH: TSH: 2.59 mIU/L (ref 0.40–4.50)

## 2015-12-02 NOTE — Patient Instructions (Addendum)

## 2015-12-02 NOTE — Progress Notes (Signed)
[facility]                       [name], M.D.        [name]. [name], P.A.-C       [name], P.A.-C   [facility]                [address]                [place], [place]. [address] Telephone [phone] Telefax [phone] ______________________________________________________________________     This very nice 45 y.o.Single WM presents for 6 month follow up with Hypertension, Hyperlipidemia, Pre-Diabetes and Vitamin D Deficiency. Of note patients mother , a dialysis patient recently died from sepsis from a cellulitis on her leg and he seems to be coping reasonably well with his grief.  Patient has completed 3 months of Lamisil for a fungal toenail and plant to complete a 2sd course alternating 1 month on and 1 month off.      Patient is treated for HTN circa 2005  & BP has been controlled at home. Today's BP: 124/72. Patient has had no complaints of any cardiac type chest pain, palpitations, dyspnea/orthopnea/PND, dizziness, claudication, or dependent edema.     Hyperlipidemia is controlled with diet & meds. Patient denies myalgias or other med SE's. Last Lipids were  Lab Results  Component Value Date   CHOL 122 (L) 08/27/2015   HDL 45 08/27/2015   LDLCALC 46 08/27/2015   TRIG 153 (H) 08/27/2015   CHOLHDL 2.7 08/27/2015      Also, the patient has history of PreDiabetes & insulin resistance circa 2011 with an A1c 4.8% and elevated insulin 77 and has had no symptoms of reactive hypoglycemia, diabetic polys, paresthesias or visual blurring.  Last A1c was at goal: Lab Results  Component Value Date   HGBA1C 5.4 08/27/2015      Patient also has hx/p low Testosterone 239 in Jan 2015 and has been on replacement with improved sense of well-being. Further, the patient also has history of Vitamin D Deficiency and supplements vitamin D without any suspected side-effects. Last vitamin D was   Lab Results   Component Value Date   VD25OH 53 05/27/2015   Current Outpatient Prescriptions on File Prior to Visit  Medication Sig  . ANDROGEL PUMP 20.25 MG/ACT (1.62%) GEL APPLY 4 PUMPS DAILY AS DIRECTED  . Cholecalciferol (VITAMIN D PO) Take 5,000 mg by mouth 2 (two) times daily.  Marland Kitchen doxycycline (VIBRAMYCIN) 100 MG capsule Take 1 capsule (100 mg total) by mouth 2 (two) times daily. Takes for rosacea.  . finasteride (PROSCAR) 5 MG tablet Take 1 tablet (5 mg total) by mouth daily.  Marland Kitchen lisinopril (PRINIVIL,ZESTRIL) 20 MG tablet TAKE ONE TABLET BY MOUTH ONE TIME DAILY  . rosuvastatin (CRESTOR) 40 MG tablet TAKE ONE TABLET BY MOUTH ONE TIME DAILY FOR CHOLESTEROL  . terbinafine (LAMISIL) 250 MG tablet Take 1 tablet (250 mg total) by mouth daily.   No current facility-administered medications on file prior to visit.    No Known Allergies  PMHx:   Past Medical History:  Diagnosis Date  . Hyperlipidemia   . Hypertension   . Hypogonadism male   . Insulin resistance   . Pre-diabetes    Immunization History  Administered Date(s) Administered  . PPD Test 05/08/2013, 05/09/2014, 05/27/2015  . Pneumococcal Polysaccharide-23 01/08/2010, 05/09/2014  . Td 10/20/2004  . Tdap 05/09/2014  .  Zoster 06/05/2015   Past Surgical History:  Procedure Laterality Date  . SPLENECTOMY     FHx:    Reviewed / unchanged  SHx:    Reviewed / unchanged  Systems Review:  Constitutional: Denies fever, chills, wt changes, headaches, insomnia, fatigue, night sweats, change in appetite. Eyes: Denies redness, blurred vision, diplopia, discharge, itchy, watery eyes.  ENT: Denies discharge, congestion, post nasal drip, epistaxis, sore throat, earache, hearing loss, dental pain, tinnitus, vertigo, sinus pain, snoring.  CV: Denies chest pain, palpitations, irregular heartbeat, syncope, dyspnea, diaphoresis, orthopnea, PND, claudication or edema. Respiratory: denies cough, dyspnea, DOE, pleurisy, hoarseness, laryngitis,  wheezing.  Gastrointestinal: Denies dysphagia, odynophagia, heartburn, reflux, water brash, abdominal pain or cramps, nausea, vomiting, bloating, diarrhea, constipation, hematemesis, melena, hematochezia  or hemorrhoids. Genitourinary: Denies dysuria, frequency, urgency, nocturia, hesitancy, discharge, hematuria or flank pain. Musculoskeletal: Denies arthralgias, myalgias, stiffness, jt. swelling, pain, limping or strain/sprain.  Skin: Denies pruritus, rash, hives, warts, acne, eczema or change in skin lesion(s). Neuro: No weakness, tremor, incoordination, spasms, paresthesia or pain. Psychiatric: Denies confusion, memory loss or sensory loss. Endo: Denies change in weight, skin or hair change.  Heme/Lymph: No excessive bleeding, bruising or enlarged lymph nodes.  Physical Exam  BP 124/72   Pulse 100   Temp 97.5 F (36.4 C)   Ht 5' 9.5" (1.765 m)   Wt 231 lb 12.8 oz (105.1 kg)   BMI 33.74 kg/m   Appears well nourished and in no distress. Eyes: PERRLA, EOMs, conjunctiva no swelling or erythema. Sinuses: No frontal/maxillary tenderness ENT/Mouth: EAC's clear, TM's nl w/o erythema, bulging. Nares clear w/o erythema, swelling, exudates. Oropharynx clear without erythema or exudates. Oral hygiene is good. Tongue normal, non obstructing. Hearing intact.  Neck: Supple. Thyroid nl. Car 2+/2+ without bruits, nodes or JVD. Chest: Respirations nl with BS clear & equal w/o rales, rhonchi, wheezing or stridor.  Cor: Heart sounds normal w/ regular rate and rhythm without sig. murmurs, gallops, clicks, or rubs. Peripheral pulses normal and equal  without edema.  Abdomen: Soft & bowel sounds normal. Non-tender w/o guarding, rebound, hernias, masses, or organomegaly.  Lymphatics: Unremarkable.  Musculoskeletal: Full ROM all peripheral extremities, joint stability, 5/5 strength, and normal gait.  Skin: Warm, dry without exposed rashes, lesions or ecchymosis apparent.  Neuro: Cranial nerves intact,  reflexes equal bilaterally. Sensory-motor testing grossly intact. Tendon reflexes grossly intact.  Pysch: Alert & oriented x 3.  Insight and judgement nl & appropriate. No ideations.  Assessment and Plan:   1. Essential hypertension  - Continue medication, monitor blood pressure at home. Continue DASH diet. Reminder to go to the ER if any CP, SOB, nausea, dizziness, severe HA, changes vision/speech, left arm numbness and tingling and jaw pain. - TSH  2. Mixed hyperlipidemia  - Continue diet/meds, exercise,& lifestyle modifications. Continue monitor periodic cholesterol/liver & renal functions  - Lipid panel - TSH  3. Insulin resistance  - Continue diet, exercise, lifestyle modifications. Monitor appropriate labs.Hemoglobin A1c - Insulin, random  4. Vitamin D deficiency  - Continue supplementation. - VITAMIN D 25 Hydroxy  5. Testosterone deficiency  - Testosterone  6. Morbid obesity due to excess calories (HCC)  - CBC with Differential/Platelet - BASIC METABOLIC PANEL WITH GFR - Hepatic function panel - Magnesium   Recommended regular exercise, BP monitoring, weight control, and discussed med and SE's. Recommended labs to assess and monitor clinical status. Further disposition pending results of labs. Over 30 minutes of exam, counseling, chart review was performed

## 2015-12-03 LAB — VITAMIN D 25 HYDROXY (VIT D DEFICIENCY, FRACTURES): Vit D, 25-Hydroxy: 60 ng/mL (ref 30–100)

## 2015-12-03 LAB — TESTOSTERONE: Testosterone: 295 ng/dL (ref 250–827)

## 2015-12-03 LAB — HEMOGLOBIN A1C
Hgb A1c MFr Bld: 4.8 % (ref <5.7)
Mean Plasma Glucose: 91 mg/dL

## 2015-12-03 LAB — INSULIN, RANDOM: Insulin: 54.9 u[IU]/mL — ABNORMAL HIGH (ref 2.0–19.6)

## 2015-12-11 ENCOUNTER — Other Ambulatory Visit: Payer: Self-pay | Admitting: Internal Medicine

## 2015-12-11 DIAGNOSIS — E349 Endocrine disorder, unspecified: Secondary | ICD-10-CM

## 2015-12-12 ENCOUNTER — Telehealth: Payer: Self-pay

## 2015-12-12 NOTE — Telephone Encounter (Signed)
Rx called into [facility] in [place].

## 2015-12-25 ENCOUNTER — Other Ambulatory Visit: Payer: Self-pay | Admitting: Internal Medicine

## 2016-02-25 ENCOUNTER — Other Ambulatory Visit: Payer: Self-pay | Admitting: Internal Medicine

## 2016-03-04 ENCOUNTER — Ambulatory Visit (INDEPENDENT_AMBULATORY_CARE_PROVIDER_SITE_OTHER): Payer: BLUE CROSS/BLUE SHIELD | Admitting: Physician Assistant

## 2016-03-04 ENCOUNTER — Encounter: Payer: Self-pay | Admitting: Physician Assistant

## 2016-03-04 VITALS — BP 132/84 | HR 67 | Temp 97.5°F | Resp 16 | Ht 69.0 in | Wt 238.0 lb

## 2016-03-04 DIAGNOSIS — E349 Endocrine disorder, unspecified: Secondary | ICD-10-CM | POA: Diagnosis not present

## 2016-03-04 DIAGNOSIS — E8881 Metabolic syndrome: Secondary | ICD-10-CM

## 2016-03-04 DIAGNOSIS — I10 Essential (primary) hypertension: Secondary | ICD-10-CM

## 2016-03-04 DIAGNOSIS — E559 Vitamin D deficiency, unspecified: Secondary | ICD-10-CM

## 2016-03-04 DIAGNOSIS — E782 Mixed hyperlipidemia: Secondary | ICD-10-CM

## 2016-03-04 DIAGNOSIS — Z79899 Other long term (current) drug therapy: Secondary | ICD-10-CM

## 2016-03-04 LAB — BASIC METABOLIC PANEL WITH GFR
BUN: 10 mg/dL (ref 7–25)
CO2: 26 mmol/L (ref 20–31)
Calcium: 9.8 mg/dL (ref 8.6–10.3)
Chloride: 102 mmol/L (ref 98–110)
Creat: 1 mg/dL (ref 0.60–1.35)
GFR, Est African American: >89 mL/min (ref >=60)
GFR, Est Non African American: >89 mL/min (ref >=60)
Glucose, Bld: 104 mg/dL — ABNORMAL HIGH (ref 65–99)
Potassium: 4.7 mmol/L (ref 3.5–5.3)
Sodium: 138 mmol/L (ref 135–146)

## 2016-03-04 LAB — MAGNESIUM: Magnesium: 1.7 mg/dL (ref 1.5–2.5)

## 2016-03-04 LAB — CBC WITH DIFFERENTIAL/PLATELET
Basophils Absolute: 130 cells/uL (ref 0–200)
Basophils Relative: 1 %
Eosinophils Absolute: 2210 cells/uL — ABNORMAL HIGH (ref 15–500)
Eosinophils Relative: 17 %
HCT: 47.4 % (ref 38.5–50.0)
Hemoglobin: 16.6 g/dL (ref 13.2–17.1)
Lymphocytes Relative: 23 %
Lymphs Abs: 2990 cells/uL (ref 850–3900)
MCH: 33.4 pg — ABNORMAL HIGH (ref 27.0–33.0)
MCHC: 35 g/dL (ref 32.0–36.0)
MCV: 95.4 fL (ref 80.0–100.0)
MPV: 8.5 fL (ref 7.5–12.5)
Monocytes Absolute: 1040 cells/uL — ABNORMAL HIGH (ref 200–950)
Monocytes Relative: 8 %
Neutro Abs: 6630 cells/uL (ref 1500–7800)
Neutrophils Relative %: 51 %
Platelets: 491 10*3/uL — ABNORMAL HIGH (ref 140–400)
RBC: 4.97 MIL/uL (ref 4.20–5.80)
RDW: 13 % (ref 11.0–15.0)
WBC: 13 10*3/uL — ABNORMAL HIGH (ref 3.8–10.8)

## 2016-03-04 LAB — HEPATIC FUNCTION PANEL
ALT: 28 U/L (ref 9–46)
AST: 24 U/L (ref 10–40)
Albumin: 4.4 g/dL (ref 3.6–5.1)
Alkaline Phosphatase: 53 U/L (ref 40–115)
Bilirubin, Direct: 0.2 mg/dL (ref <=0.2)
Indirect Bilirubin: 0.6 mg/dL (ref 0.2–1.2)
Total Bilirubin: 0.8 mg/dL (ref 0.2–1.2)
Total Protein: 6.7 g/dL (ref 6.1–8.1)

## 2016-03-04 LAB — LIPID PANEL
Cholesterol: 147 mg/dL (ref <200)
HDL: 43 mg/dL (ref >40)
LDL Cholesterol: 78 mg/dL
Total CHOL/HDL Ratio: 3.4 Ratio (ref <5.0)
Triglycerides: 132 mg/dL (ref <150)
VLDL: 26 mg/dL (ref <30)

## 2016-03-04 LAB — TSH: TSH: 2.06 mIU/L (ref 0.40–4.50)

## 2016-03-04 MED ORDER — TESTOSTERONE 20.25 MG/ACT (1.62%) TD GEL: TRANSDERMAL | 4 refills | Status: DC

## 2016-03-04 NOTE — Patient Instructions (Addendum)
Patellofemoral Pain Syndrome Patellofemoral pain syndrome is a condition that involves a softening or breakdown of the tissue (cartilage) on the underside of your kneecap (patella). This causes pain in the front of the knee. The condition is also called runner's knee or chondromalacia patella. Patellofemoral pain syndrome is most common in young adults who are active in sports. Your knee is the largest joint in your body. The patella covers the front of your knee and is attached to muscles above and below your knee. The underside of the patella is covered with a smooth type of cartilage (synovium). The smooth surface helps the patella glide easily when you move your knee. Patellofemoral pain syndrome causes swelling in the joint linings and bone surfaces in your knee.  CAUSES  Patellofemoral pain syndrome can be caused by:  Overuse.  Poor alignment of your knee joints.  Weak leg muscles.  A direct blow to your kneecap. RISK FACTORS You may be at risk for patellofemoral pain syndrome if you:  Do a lot of activities that can wear down your kneecap. These include:  Running.  Squatting.  Climbing stairs.  Start a new physical activity or exercise program.  Wear shoes that do not fit well.  Do not have good leg strength.  Are overweight. SIGNS AND SYMPTOMS  Knee pain is the most common symptom of patellofemoral pain syndrome. This may feel like a dull, aching pain underneath your patella, in the front of your knee. There may be a popping or cracking sound when you move your knee. Pain may get worse with:  Exercise.  Climbing stairs.  Running.  Jumping.  Squatting.  Kneeling.  Sitting for a long time.  Moving or pushing on your patella. DIAGNOSIS  Your health care provider may be able to diagnose patellofemoral pain syndrome from your symptoms and medical history. You may be asked about your recent physical activities and which ones cause knee pain. Your health care  provider may do a physical exam with certain tests to confirm the diagnosis. These may include:  Moving your patella back and forth.  Checking your range of knee motion.  Having you squat or jump to see if you have pain.  Checking the strength of your leg muscles. An MRI of the knee may also be done. TREATMENT  Patellofemoral pain syndrome can usually be treated at home with rest, ice, compression, and elevation (RICE). Other treatments may include:  Nonsteroidal anti-inflammatory drugs (NSAIDs).  Physical therapy to stretch and strengthen your leg muscles.  Shoe inserts (orthotics) to take stress off your knee.  A knee brace or knee support.  Surgery to remove damaged cartilage or move the patella to a better position. The need for surgery is rare. HOME CARE INSTRUCTIONS   Take medicines only as directed by your health care provider.  Rest your knee.  When resting, keep your knee raised above the level of your heart.  Avoid activities that cause knee pain.  Apply ice to the injured area:  Put ice in a plastic bag.  Place a towel between your skin and the bag.  Leave the ice on for 20 minutes, 2-3 times a day.  Use splints, braces, knee supports, or walking aids as directed by your health care provider.  Perform stretching and strengthening exercises as directed by your health care provider or physical therapist.  Keep all follow-up visits as directed by your health care provider. This is important. SEEK MEDICAL CARE IF:   Your symptoms get worse.  You are not improving with home care. MAKE SURE YOU:  Understand these instructions.  Will watch your condition.  Will get help right away if you are not doing well or get worse.   This information is not intended to replace advice given to you by your health care provider. Make sure you discuss any questions you have with your health care provider.   Document Released: 04/01/2009 Document Revised: 05/04/2014  Document Reviewed: 07/03/2013 Elsevier Interactive Patient Education [employer]2016 [employer]  [place] I will send in lamisil for you to take. You can only get it from [facility] or [facility] becauser insurance will not cover it.The lamisil is taken once a day for  months and then if can be taken for several months after for a month on it and month off it. It gets processed through you liver so we need to check your liver function at 6 weeks after taking the drug. It can take up to 6 months to a year for your toenails to get better.   Nail Ringworm A fungal infection of the nail (tinea unguium/onychomycosis) is common. It is common as the visible part of the nail is composed of dead cells which have no blood supply to help prevent infection. It occurs because fungi are everywhere and will pick any opportunity to grow on any dead material. Because nails are very slow growing they require up to 2 years of treatment with anti-fungal medications. The entire nail back to the base is infected. This includes approximately  of the nail which you cannot see. If your caregiver has prescribed a medication by mouth, take it every day and as directed. No progress will be seen for at least 6 to 9 months. Do not be disappointed! Because fungi live on dead cells with little or no exposure to blood supply, medication delivery to the infection is slow; thus the cure is slow. It is also why you can observe no progress in the first 6 months. The nail becoming cured is the base of the nail, as it has the blood supply. Topical medication such as creams and ointments are usually not effective. Important in successful treatment of nail fungus is closely following the medication regimen that your doctor prescribes. Sometimes you and your caregiver may elect to speed up this process by surgical removal of all the nails. Even this may still require 6 to 9 months of additional oral medications. See your caregiver as directed. Remember there  will be no visible improvement for at least 6 months. See your caregiver sooner if other signs of infection (redness and swelling) develop.   This information is not intended to replace advice given to you by your health care provider. Make sure you discuss any questions you have with your health care provider.   Document Released: 04/10/2000 Document Revised: 08/28/2014 Document Reviewed: 10/15/2014 Elsevier Interactive Patient Education [employer]2016 Elsevier Inc.

## 2016-03-04 NOTE — Progress Notes (Signed)
Assessment and Plan:  Hypertension:  -Continue medication,  -monitor blood pressure at home.  -Continue DASH diet.   -Reminder to go to the ER if any CP, SOB, nausea, dizziness, severe HA, changes vision/speech, left arm numbness and tingling, and jaw pain.  Cholesterol: -Continue diet and exercise.  -Check cholesterol.   Pre-diabetes: -Continue diet and exercise.  -Check A1C  Vitamin D Def: -check level -continue medications.   Testosterone Def -androgel -check testosterone level  Left knee pain Likely patella femoral, suggest exercises, stretching   Continue diet and meds as discussed. Further disposition pending results of labs.  HPI 45 y.o. male  presents for 3 month follow up with hypertension, hyperlipidemia, prediabetes and vitamin D.   His blood pressure has been controlled at home, today their BP is BP: 132/84.   He does workout. He denies chest pain, shortness of breath, dizziness.  Having left knee pain x 2 week, at front of knee cap, no injury.    He is on cholesterol medication and denies myalgias. His cholesterol is at goal. The cholesterol last visit was:   Lab Results  Component Value Date   CHOL 155 12/02/2015   HDL 63 12/02/2015   LDLCALC 56 12/02/2015   TRIG 181 (H) 12/02/2015   CHOLHDL 2.5 12/02/2015     He has been working on diet and exercise for prediabetes, and denies foot ulcerations, hyperglycemia, hypoglycemia , increased appetite, nausea, paresthesia of the feet, polydipsia, polyuria, visual disturbances, vomiting and weight loss. Last A1C in the office was:  Lab Results  Component Value Date   HGBA1C 4.8 12/02/2015    Patient is on Vitamin D supplement.  Lab Results  Component Value Date   VD25OH 60 12/02/2015     Patient reports that he has been taking terbafine daily for a fungal infection of his right big toenail.  He feels minimal change in the toe nail.    He reports that he is still doing 4 pumps of androgel daily.  He has  a history of testosterone deficiency and is on testosterone replacement. He states that the testosterone helps with his energy, libido, muscle mass. Lab Results  Component Value Date   TESTOSTERONE 295 12/02/2015     Current Medications:  Current Outpatient Prescriptions on File Prior to Visit  Medication Sig Dispense Refill  . ANDROGEL PUMP 20.25 MG/ACT (1.62%) GEL USE 3 PUMPS DAILY AS DIRECTED 150 g 4  . Cholecalciferol (VITAMIN D PO) Take 5,000 mg by mouth 2 (two) times daily.    Marland Kitchen. doxycycline (VIBRAMYCIN) 100 MG capsule Take 1 capsule (100 mg total) by mouth 2 (two) times daily. Takes for rosacea. 60 capsule 5  . finasteride (PROSCAR) 5 MG tablet Take 1 tablet (5 mg total) by mouth daily. 90 tablet 1  . lisinopril (PRINIVIL,ZESTRIL) 20 MG tablet TAKE ONE TABLET BY MOUTH ONE TIME DAILY 90 tablet 0  . phentermine (ADIPEX-P) 37.5 MG tablet TAKE 1/2 TO 1 TABLET BY MOUTH DAILY IN THE MORNING FOR DIETING AND WEIGHT LOSS. 30 tablet 3  . rosuvastatin (CRESTOR) 40 MG tablet TAKE ONE TABLET BY MOUTH ONE TIME DAILY FOR CHOLESTEROL 90 tablet 1  . terbinafine (LAMISIL) 250 MG tablet Take 1 tablet (250 mg total) by mouth daily. 90 tablet 3   No current facility-administered medications on file prior to visit.     Medical History:  Past Medical History:  Diagnosis Date  . Hyperlipidemia   . Hypertension   . Hypogonadism male   . Insulin  resistance   . Pre-diabetes     Allergies: No Known Allergies   Review of Systems:  Review of Systems  Constitutional: Negative for chills, fever and malaise/fatigue.  HENT: Positive for congestion. Negative for ear pain and sore throat.   Eyes: Negative.   Respiratory: Negative for cough, shortness of breath and wheezing.   Cardiovascular: Negative for chest pain, palpitations and leg swelling.  Gastrointestinal: Negative for blood in stool, constipation, diarrhea, heartburn and melena.  Genitourinary: Negative.   Neurological: Negative for dizziness,  loss of consciousness and headaches.  Psychiatric/Behavioral: Negative for depression. The patient is not nervous/anxious and does not have insomnia.     Family history- Review and unchanged  Social history- Review and unchanged  Physical Exam: BP 132/84   Pulse 67   Temp 97.5 F (36.4 C)   Resp 16   Ht 5\' 9"  (1.753 m)   Wt 238 lb (108 kg)   SpO2 95%   BMI 35.15 kg/m  Wt Readings from Last 3 Encounters:  03/04/16 238 lb (108 kg)  12/02/15 231 lb 12.8 oz (105.1 kg)  08/27/15 226 lb (102.5 kg)    General Appearance: Well nourished well developed, in no apparent distress. Eyes: PERRLA, EOMs, conjunctiva no swelling or erythema ENT/Mouth: Ear canals normal without obstruction, swelling, erythma, discharge.  TMs normal bilaterally.  Oropharynx moist, clear, without exudate, or postoropharyngeal swelling. Neck: Supple, thyroid normal,no cervical adenopathy  Respiratory: Respiratory effort normal, Breath sounds clear A&P without rhonchi, wheeze, or rale.  No retractions, no accessory usage. Cardio: RRR with no MRGs. Brisk peripheral pulses without edema.  Abdomen: Soft, + BS,  Non tender, no guarding, rebound, hernias, masses. Musculoskeletal: Full ROM, 5/5 strength, Normal gait Skin: Warm, dry without rashes, lesions, ecchymosis.  Neuro: Awake and oriented X 3, Cranial nerves intact. Normal muscle tone, no cerebellar symptoms. Psych: Normal affect, Insight and Judgment appropriate.    Quentin MullingAmanda Collier, PA-C 2:45 PM [facility]

## 2016-03-05 LAB — HEMOGLOBIN A1C
Hgb A1c MFr Bld: 5.2 % (ref <5.7)
Mean Plasma Glucose: 103 mg/dL

## 2016-03-05 LAB — VITAMIN D 25 HYDROXY (VIT D DEFICIENCY, FRACTURES): Vit D, 25-Hydroxy: 63 ng/mL (ref 30–100)

## 2016-03-05 LAB — TESTOSTERONE: Testosterone: 260 ng/dL (ref 250–827)

## 2016-03-21 ENCOUNTER — Other Ambulatory Visit: Payer: Self-pay | Admitting: Internal Medicine

## 2016-04-07 ENCOUNTER — Other Ambulatory Visit: Payer: Self-pay | Admitting: Internal Medicine

## 2016-05-15 ENCOUNTER — Other Ambulatory Visit: Payer: Self-pay | Admitting: Internal Medicine

## 2016-06-11 ENCOUNTER — Other Ambulatory Visit: Payer: Self-pay | Admitting: Internal Medicine

## 2016-06-15 ENCOUNTER — Encounter: Payer: Self-pay | Admitting: Internal Medicine

## 2016-06-15 ENCOUNTER — Other Ambulatory Visit: Payer: Self-pay | Admitting: Internal Medicine

## 2016-06-15 ENCOUNTER — Ambulatory Visit (INDEPENDENT_AMBULATORY_CARE_PROVIDER_SITE_OTHER): Payer: BLUE CROSS/BLUE SHIELD | Admitting: Internal Medicine

## 2016-06-15 VITALS — BP 120/84 | HR 72 | Temp 97.3°F | Resp 16 | Ht 69.5 in | Wt 237.6 lb

## 2016-06-15 DIAGNOSIS — Z79899 Other long term (current) drug therapy: Secondary | ICD-10-CM

## 2016-06-15 DIAGNOSIS — E8881 Metabolic syndrome: Secondary | ICD-10-CM

## 2016-06-15 DIAGNOSIS — Z0001 Encounter for general adult medical examination with abnormal findings: Secondary | ICD-10-CM

## 2016-06-15 DIAGNOSIS — Z1212 Encounter for screening for malignant neoplasm of rectum: Secondary | ICD-10-CM

## 2016-06-15 DIAGNOSIS — Z111 Encounter for screening for respiratory tuberculosis: Secondary | ICD-10-CM

## 2016-06-15 DIAGNOSIS — Z136 Encounter for screening for cardiovascular disorders: Secondary | ICD-10-CM

## 2016-06-15 DIAGNOSIS — E782 Mixed hyperlipidemia: Secondary | ICD-10-CM

## 2016-06-15 DIAGNOSIS — E559 Vitamin D deficiency, unspecified: Secondary | ICD-10-CM | POA: Diagnosis not present

## 2016-06-15 DIAGNOSIS — Z0000 Encounter for general adult medical examination without abnormal findings: Secondary | ICD-10-CM

## 2016-06-15 DIAGNOSIS — I10 Essential (primary) hypertension: Secondary | ICD-10-CM

## 2016-06-15 DIAGNOSIS — Z125 Encounter for screening for malignant neoplasm of prostate: Secondary | ICD-10-CM

## 2016-06-15 DIAGNOSIS — R5383 Other fatigue: Secondary | ICD-10-CM

## 2016-06-15 DIAGNOSIS — E349 Endocrine disorder, unspecified: Secondary | ICD-10-CM

## 2016-06-15 LAB — BASIC METABOLIC PANEL WITH GFR
BUN: 10 mg/dL (ref 7–25)
CO2: 25 mmol/L (ref 20–31)
Calcium: 9.6 mg/dL (ref 8.6–10.3)
Chloride: 103 mmol/L (ref 98–110)
Creat: 1.06 mg/dL (ref 0.60–1.35)
GFR, Est African American: >89 mL/min (ref >=60)
GFR, Est Non African American: 84 mL/min (ref >=60)
Glucose, Bld: 103 mg/dL — ABNORMAL HIGH (ref 65–99)
Potassium: 4.5 mmol/L (ref 3.5–5.3)
Sodium: 138 mmol/L (ref 135–146)

## 2016-06-15 LAB — CBC WITH DIFFERENTIAL/PLATELET
Basophils Absolute: 109 cells/uL (ref 0–200)
Basophils Relative: 1 %
Eosinophils Absolute: 654 cells/uL — ABNORMAL HIGH (ref 15–500)
Eosinophils Relative: 6 %
HCT: 46.6 % (ref 38.5–50.0)
Hemoglobin: 16.6 g/dL (ref 13.2–17.1)
Lymphocytes Relative: 23 %
Lymphs Abs: 2507 cells/uL (ref 850–3900)
MCH: 33.3 pg — ABNORMAL HIGH (ref 27.0–33.0)
MCHC: 35.6 g/dL (ref 32.0–36.0)
MCV: 93.6 fL (ref 80.0–100.0)
MPV: 8.7 fL (ref 7.5–12.5)
Monocytes Absolute: 1199 cells/uL — ABNORMAL HIGH (ref 200–950)
Monocytes Relative: 11 %
Neutro Abs: 6431 cells/uL (ref 1500–7800)
Neutrophils Relative %: 59 %
Platelets: 514 10*3/uL — ABNORMAL HIGH (ref 140–400)
RBC: 4.98 MIL/uL (ref 4.20–5.80)
RDW: 13.1 % (ref 11.0–15.0)
WBC: 10.9 10*3/uL — ABNORMAL HIGH (ref 3.8–10.8)

## 2016-06-15 LAB — TSH: TSH: 2.16 mIU/L (ref 0.40–4.50)

## 2016-06-15 LAB — LIPID PANEL
Cholesterol: 150 mg/dL (ref <200)
HDL: 42 mg/dL (ref >40)
LDL Cholesterol: 71 mg/dL (ref <100)
Total CHOL/HDL Ratio: 3.6 Ratio (ref <5.0)
Triglycerides: 184 mg/dL — ABNORMAL HIGH (ref <150)
VLDL: 37 mg/dL — ABNORMAL HIGH (ref <30)

## 2016-06-15 LAB — HEPATIC FUNCTION PANEL
ALT: 30 U/L (ref 9–46)
AST: 23 U/L (ref 10–40)
Albumin: 4.3 g/dL (ref 3.6–5.1)
Alkaline Phosphatase: 52 U/L (ref 40–115)
Bilirubin, Direct: 0.2 mg/dL (ref <=0.2)
Indirect Bilirubin: 0.7 mg/dL (ref 0.2–1.2)
Total Bilirubin: 0.9 mg/dL (ref 0.2–1.2)
Total Protein: 6.9 g/dL (ref 6.1–8.1)

## 2016-06-15 LAB — IRON AND TIBC
%SAT: 51 % (ref 15–60)
Iron: 167 ug/dL (ref 50–180)
TIBC: 328 ug/dL (ref 250–425)
UIBC: 161 ug/dL (ref 125–400)

## 2016-06-15 LAB — HEMOGLOBIN A1C
Hgb A1c MFr Bld: 5.3 % (ref <5.7)
Mean Plasma Glucose: 105 mg/dL

## 2016-06-15 LAB — PSA: PSA: 0.3 ng/mL (ref <=4.0)

## 2016-06-15 LAB — VITAMIN B12: Vitamin B-12: 809 pg/mL (ref 200–1100)

## 2016-06-15 MED ORDER — TERBINAFINE HCL 250 MG PO TABS: 250.0000 mg | ORAL_TABLET | Freq: Every day | ORAL | 0 refills | Status: DC

## 2016-06-15 NOTE — Progress Notes (Signed)
ADULT & ADOLESCENT INTERNAL MEDICINE   Noah Dennis, M.D.    [name]. [name], P.A.-C      [name], P.A.-C  [facility]                [address]                [place], [place]. [address] Telephone [phone] Telefax [phone] Annual  Screening/Preventative Visit  & Comprehensive Evaluation & Examination     This very nice 46 y.o. Single WM presents for a Screening/Preventative Visit & comprehensive evaluation and management of multiple medical co-morbidities.  Patient has been followed for HTN, Prediabetes, Hyperlipidemia and Vitamin D Deficiency.     HTN predates since 2005. Patient's BP has been controlled at home.  Today's BP is at goal - 120/84. Patient denies any cardiac symptoms as chest pain, palpitations, shortness of breath, dizziness or ankle swelling.     Patient's hyperlipidemia is controlled with diet and medications. Patient denies myalgias or other medication SE's. Last lipids were at goal:  Lab Results  Component Value Date   CHOL 147 03/04/2016   HDL 43 03/04/2016   LDLCALC 78 03/04/2016   TRIG 132 03/04/2016   CHOLHDL 3.4 03/04/2016      Patient has Morbid Obesity (BMI 34+) and consequent prediabetes/Insulin Resistance (A1c 4.8% with Insulin 77 in 2011 and A1c 5.0% with Insulin 133 in 2014) and patient denies reactive hypoglycemic symptoms, visual blurring, diabetic polys or paresthesias. Last A1c was 5.2% with Insulin 54.9 in Aug 2017.      Patient also has hx/o Low Testosterone (185 in 2013 and 239 in 2015)and currently is on rep[lacement with Testosterone gel. Patient also has Finally, patient has history of Vitamin D Deficiency  and last vitamin D was at goal: Lab Results  Component Value Date   VD25OH 63 03/04/2016   Current Outpatient Prescriptions on File Prior to Visit  Medication Sig  . VITAMIN D  5,000 mg Take 2  times daily.  Marland Kitchen doxycycline  100 MG  Take 1 cap 2 times daily for rosacea.  .  finasteride  5 MG  TAKE 1 TAB DAILY.  Marland Kitchen Lisinopril 20 MG  TAKE ONE TAB ONE TIME DAILY  . rosuvastatin  40 MG  TAKE ONE TAB ONE TIME DAILY   . terbinafine  250 MG tablet Take 1 tablet  daily.  . ANDROGEL PUMP  (1.62%) GEL USE 4 PUMPS DAILY AS DIRECTED (needs 2 bottles a month)   No Known Allergies   Past Medical History:  Diagnosis Date  . Hyperlipidemia   . Hypertension   . Hypogonadism male   . Insulin resistance   . Pre-diabetes    Health Maintenance  Topic Date Due  . INFLUENZA VACCINE  11/26/2015  . TETANUS/TDAP  05/09/2024  . HIV Screening  Completed   Immunization History  Administered Date(s) Administered  . PPD Test 05/08/2013, 05/09/2014, 05/27/2015, 06/15/2016  . Pneumococcal Polysaccharide-23 01/08/2010, 05/09/2014  . Td 10/20/2004  . Tdap 05/09/2014  . Zoster 06/05/2015   Past Surgical History:  Procedure Laterality Date  . SPLENECTOMY     Family History  Problem Relation Age of Onset  . Diabetes Mother   . Hypertension Mother   . Heart disease Mother   . Hypertension Father   . Cancer Father    Social History   Social History  . Marital status: Single    Spouse name: N/A  . Number of children: N/A  .  Years of education: N/A   Occupational History  . Mortgage broker   Social History Main Topics  . Smoking status: Never Smoker  . Smokeless tobacco: Not on file  . Alcohol use Yes     Comment: occasionally  . Drug use: No  . Sexual activity: Not on file    ROS Constitutional: Denies fever, chills, weight loss/gain, headaches, insomnia,  night sweats or change in appetite. Does c/o fatigue. Eyes: Denies redness, blurred vision, diplopia, discharge, itchy or watery eyes.  ENT: Denies discharge, congestion, post nasal drip, epistaxis, sore throat, earache, hearing loss, dental pain, Tinnitus, Vertigo, Sinus pain or snoring.  Cardio: Denies chest pain, palpitations, irregular heartbeat, syncope, dyspnea, diaphoresis, orthopnea, PND, claudication  or edema Respiratory: denies cough, dyspnea, DOE, pleurisy, hoarseness, laryngitis or wheezing.  Gastrointestinal: Denies dysphagia, heartburn, reflux, water brash, pain, cramps, nausea, vomiting, bloating, diarrhea, constipation, hematemesis, melena, hematochezia, jaundice or hemorrhoids Genitourinary: Denies dysuria, frequency, urgency, nocturia, hesitancy, discharge, hematuria or flank pain Musculoskeletal: Denies arthralgia, myalgia, stiffness, Jt. Swelling, pain, limp or strain/sprain. Denies Falls. Skin: Denies puritis, rash, hives, warts, acne, eczema or change in skin lesion Neuro: No weakness, tremor, incoordination, spasms, paresthesia or pain Psychiatric: Denies confusion, memory loss or sensory loss. Denies Depression. Endocrine: Denies change in weight, skin, hair change, nocturia, and paresthesia, diabetic polys, visual blurring or hyper / hypo glycemic episodes.  Heme/Lymph: No excessive bleeding, bruising or enlarged lymph nodes.  Physical Exam  BP 120/84   P 72   T 97.3 F )   R 16   Ht 5' 9.5"    Wt 237 lb 9.6 oz    BMI 34.58   General Appearance: Over nourished, in no apparent distress.  Eyes: PERRLA, EOMs, conjunctiva no swelling or erythema, normal fundi and vessels. Sinuses: No frontal/maxillary tenderness ENT/Mouth: EACs patent / TMs  nl. Nares clear without erythema, swelling, mucoid exudates. Oral hygiene is good. No erythema, swelling, or exudate. Tongue normal, non-obstructing. Tonsils not swollen or erythematous. Hearing normal.  Neck: Supple, thyroid normal. No bruits, nodes or JVD. Respiratory: Respiratory effort normal.  BS equal and clear bilateral without rales, rhonci, wheezing or stridor. Cardio: Heart sounds are normal with regular rate and rhythm and no murmurs, rubs or gallops. Peripheral pulses are normal and equal bilaterally without edema. No aortic or femoral bruits. Chest: symmetric with normal excursions and percussion.  Abdomen: Soft, with Nl  bowel sounds. Nontender, no guarding, rebound, hernias, masses, or organomegaly.  Lymphatics: Non tender without lymphadenopathy.  Genitourinary: No hernias.Testes nl. DRE - prostate nl for age - smooth & firm w/o nodules. Musculoskeletal: Full ROM all peripheral extremities, joint stability, 5/5 strength, and normal gait. Skin: Warm and dry without rashes, lesions, cyanosis, clubbing or  ecchymosis.  Neuro: Cranial nerves intact, reflexes equal bilaterally. Normal muscle tone, no cerebellar symptoms. Sensation intact.  Pysch: Alert and oriented X 3 with normal affect, insight and judgment appropriate.   Assessment and Plan  1. Annual Preventative/Screening Exam   2. Essential hypertension  - Microalbumin / creatinine urine ratio - EKG 12-Lead - Urinalysis, Routine w reflex microscopic - CBC with Differential/Platelet - BASIC METABOLIC PANEL WITH GFR - TSH  3. Mixed hyperlipidemia  - EKG 12-Lead - Hepatic function panel - Lipid panel - TSH  4. Insulin resistance  - EKG 12-Lead - Hemoglobin A1c - Insulin, random  5. Vitamin D deficiency  - VITAMIN D 25 Hydroxy   6. Screening for rectal cancer  - POC Hemoccult Bld/Stl   7.  Prostate cancer screening  - PSA  8. Testosterone deficiency  - Testosterone  9. Screening for ischemic heart disease  - EKG 12-Lead  10. Fatigue, unspecified type  - Vitamin B12 - Iron and TIBC - Testosterone - CBC with Differential/Platelet - TSH  11. Medication management  - CBC with Differential/Platelet - BASIC METABOLIC PANEL WITH GFR - Hepatic function panel - Magnesium - Lipid panel - TSH - Hemoglobin A1c - Insulin, random - VITAMIN D 25 Hydroxy   12. Screening examination for pulmonary tuberculosis  - PPD      Continue prudent diet as discussed, weight control, BP monitoring, regular exercise, and medications as discussed.  Discussed med effects and SE's. Routine screening labs and tests as requested with regular  follow-up as recommended. Over 40 minutes of exam, counseling, chart review and high complex critical decision making was performed

## 2016-06-15 NOTE — Patient Instructions (Signed)

## 2016-06-16 LAB — MICROALBUMIN / CREATININE URINE RATIO
Creatinine, Urine: 247 mg/dL (ref 20–370)
Microalb Creat Ratio: 3 mcg/mg creat (ref <30)
Microalb, Ur: 0.7 mg/dL

## 2016-06-16 LAB — URINALYSIS, ROUTINE W REFLEX MICROSCOPIC
Bilirubin Urine: NEGATIVE
Glucose, UA: NEGATIVE
Hgb urine dipstick: NEGATIVE
Ketones, ur: NEGATIVE
Leukocytes, UA: NEGATIVE
Nitrite: NEGATIVE
Protein, ur: NEGATIVE
Specific Gravity, Urine: 1.021 (ref 1.001–1.035)
pH: 6.5 (ref 5.0–8.0)

## 2016-06-16 LAB — INSULIN, RANDOM: Insulin: 13.2 u[IU]/mL (ref 2.0–19.6)

## 2016-06-16 LAB — VITAMIN D 25 HYDROXY (VIT D DEFICIENCY, FRACTURES): Vit D, 25-Hydroxy: 63 ng/mL (ref 30–100)

## 2016-06-16 LAB — TESTOSTERONE: Testosterone: 420 ng/dL (ref 250–827)

## 2016-06-16 LAB — MAGNESIUM: Magnesium: 1.6 mg/dL (ref 1.5–2.5)

## 2016-06-17 LAB — TB SKIN TEST
Induration: 0 mm
TB Skin Test: NEGATIVE

## 2016-07-21 ENCOUNTER — Other Ambulatory Visit: Payer: Self-pay | Admitting: Physician Assistant

## 2016-07-21 DIAGNOSIS — E349 Endocrine disorder, unspecified: Secondary | ICD-10-CM

## 2016-07-21 NOTE — Telephone Encounter (Signed)
Please call Androgel pumps

## 2016-07-22 ENCOUNTER — Other Ambulatory Visit: Payer: Self-pay | Admitting: Internal Medicine

## 2016-08-12 ENCOUNTER — Other Ambulatory Visit: Payer: Self-pay | Admitting: Physician Assistant

## 2016-09-11 NOTE — Progress Notes (Signed)
Assessment and Plan:  Hypertension:  -Continue medication,  -monitor blood pressure at home.  -Continue DASH diet.   -Reminder to go to the ER if any CP, SOB, nausea, dizziness, severe HA, changes vision/speech, left arm numbness and tingling, and jaw pain.  Cholesterol: -Continue diet and exercise.  -Check cholesterol.   Pre-diabetes: -Continue diet and exercise.  -Check A1C  Vitamin D Def: -check level -continue medications.   Testosterone Def -androgel -check testosterone level - check estrogen level  Morbid Obesity with co morbidities - long discussion about weight loss, diet, and exercise - phentermine, will monitor  Continue diet and meds as discussed. Further disposition pending results of labs. Future Appointments Date Time Provider Department Center  12/17/2016 2:30 PM [name], MD [facility] None  06/24/2017 3:00 PM [name], MD [facility] None    HPI 46 y.o. male  presents for 3 month follow up with hypertension, hyperlipidemia, prediabetes and vitamin D.   His blood pressure has been controlled at home, today their BP is BP: 126/82.   He does workout. He denies chest pain, shortness of breath, dizziness.   He is on cholesterol medication and denies myalgias. His cholesterol is at goal. The cholesterol last visit was:   Lab Results  Component Value Date   CHOL 150 06/15/2016   HDL 42 06/15/2016   LDLCALC 71 06/15/2016   TRIG 184 (H) 06/15/2016   CHOLHDL 3.6 06/15/2016     He has been working on diet and exercise for prediabetes, and denies foot ulcerations, hyperglycemia, hypoglycemia , increased appetite, nausea, paresthesia of the feet, polydipsia, polyuria, visual disturbances, vomiting and weight loss. Last A1C in the office was:  Lab Results  Component Value Date   HGBA1C 5.3 06/15/2016    Patient is on Vitamin D supplement.  Lab Results  Component Value Date   VD25OH 46 06/15/2016    He reports that he is still doing 4 pumps  of androgel daily.  He has a history of testosterone deficiency and is on testosterone replacement. He states that the testosterone helps with his energy, libido, muscle mass. Lab Results  Component Value Date   TESTOSTERONE 420 06/15/2016    BMI is Body mass index is 35.14 kg/m., he is working on diet and exercise. Wt Readings from Last 3 Encounters:  09/14/16 241 lb 6.4 oz (109.5 kg)  06/15/16 237 lb 9.6 oz (107.8 kg)  03/04/16 238 lb (108 kg)     Current Medications:  Current Outpatient Prescriptions on File Prior to Visit  Medication Sig Dispense Refill  . ANDROGEL PUMP 20.25 MG/ACT (1.62%) GEL APPLY 4 PUMPS DAILY AS DIRECTED 450 g 1  . Cholecalciferol (VITAMIN D PO) Take 5,000 mg by mouth 2 (two) times daily.    Marland Kitchen doxycycline (VIBRAMYCIN) 100 MG capsule TAKE 1 CAPSULE (100 MG TOTAL) BY MOUTH 2 (TWO) TIMES DAILY. TAKES FOR ROSACEA. 60 capsule 5  . finasteride (PROSCAR) 5 MG tablet TAKE 1 TABLET (5 MG TOTAL) BY MOUTH DAILY. 90 tablet 1  . lisinopril (PRINIVIL,ZESTRIL) 20 MG tablet TAKE ONE TABLET BY MOUTH ONE TIME DAILY 90 tablet 1  . rosuvastatin (CRESTOR) 40 MG tablet TAKE ONE TABLET BY MOUTH ONE TIME DAILY FOR CHOLESTEROL 90 tablet 1  . terbinafine (LAMISIL) 250 MG tablet Take 1 tablet (250 mg total) by mouth daily. 90 tablet 0   No current facility-administered medications on file prior to visit.     Medical History:  Past Medical History:  Diagnosis Date  . Hyperlipidemia   .  Hypertension   . Hypogonadism male   . Insulin resistance   . Pre-diabetes     Allergies: No Known Allergies   Review of Systems:  Review of Systems  Constitutional: Negative for chills, fever and malaise/fatigue.  HENT: Positive for congestion. Negative for ear pain and sore throat.   Eyes: Negative.   Respiratory: Negative for cough, shortness of breath and wheezing.   Cardiovascular: Negative for chest pain, palpitations and leg swelling.  Gastrointestinal: Negative for blood in stool,  constipation, diarrhea, heartburn and melena.  Genitourinary: Negative.   Neurological: Negative for dizziness, loss of consciousness and headaches.  Psychiatric/Behavioral: Negative for depression. The patient is not nervous/anxious and does not have insomnia.     Family history- Review and unchanged  Social history- Review and unchanged  Physical Exam: BP 126/82   Pulse 95   Temp 97.7 F (36.5 C)   Resp 16   Ht 5' 9.5" (1.765 m)   Wt 241 lb 6.4 oz (109.5 kg)   SpO2 96%   BMI 35.14 kg/m  Wt Readings from Last 3 Encounters:  09/14/16 241 lb 6.4 oz (109.5 kg)  06/15/16 237 lb 9.6 oz (107.8 kg)  03/04/16 238 lb (108 kg)    General Appearance: Well nourished well developed, in no apparent distress. Eyes: PERRLA, EOMs, conjunctiva no swelling or erythema ENT/Mouth: Ear canals normal without obstruction, swelling, erythma, discharge.  TMs normal bilaterally.  Oropharynx moist, clear, without exudate, or postoropharyngeal swelling. Neck: Supple, thyroid normal,no cervical adenopathy  Respiratory: Respiratory effort normal, Breath sounds clear A&P without rhonchi, wheeze, or rale.  No retractions, no accessory usage. Cardio: RRR with no MRGs. Brisk peripheral pulses without edema.  Abdomen: Soft, + BS,  Non tender, no guarding, rebound, hernias, masses. Musculoskeletal: Full ROM, 5/5 strength, Normal gait Skin: Warm, dry without rashes, lesions, ecchymosis.  Neuro: Awake and oriented X 3, Cranial nerves intact. Normal muscle tone, no cerebellar symptoms. Psych: Normal affect, Insight and Judgment appropriate.    Quentin MullingAmanda Rashanda Magloire, PA-C 2:09 PM [facility]

## 2016-09-14 ENCOUNTER — Encounter: Payer: Self-pay | Admitting: Physician Assistant

## 2016-09-14 ENCOUNTER — Ambulatory Visit (INDEPENDENT_AMBULATORY_CARE_PROVIDER_SITE_OTHER): Payer: BLUE CROSS/BLUE SHIELD | Admitting: Physician Assistant

## 2016-09-14 VITALS — BP 126/82 | HR 95 | Temp 97.7°F | Resp 16 | Ht 69.5 in | Wt 241.4 lb

## 2016-09-14 DIAGNOSIS — Z79899 Other long term (current) drug therapy: Secondary | ICD-10-CM | POA: Diagnosis not present

## 2016-09-14 DIAGNOSIS — Z113 Encounter for screening for infections with a predominantly sexual mode of transmission: Secondary | ICD-10-CM | POA: Diagnosis not present

## 2016-09-14 DIAGNOSIS — E782 Mixed hyperlipidemia: Secondary | ICD-10-CM | POA: Diagnosis not present

## 2016-09-14 DIAGNOSIS — E8881 Metabolic syndrome: Secondary | ICD-10-CM | POA: Diagnosis not present

## 2016-09-14 DIAGNOSIS — E349 Endocrine disorder, unspecified: Secondary | ICD-10-CM

## 2016-09-14 DIAGNOSIS — E559 Vitamin D deficiency, unspecified: Secondary | ICD-10-CM | POA: Diagnosis not present

## 2016-09-14 DIAGNOSIS — I10 Essential (primary) hypertension: Secondary | ICD-10-CM

## 2016-09-14 LAB — CBC WITH DIFFERENTIAL/PLATELET
Basophils Absolute: 115 cells/uL (ref 0–200)
Basophils Relative: 1 %
Eosinophils Absolute: 230 cells/uL (ref 15–500)
Eosinophils Relative: 2 %
HCT: 46.9 % (ref 38.5–50.0)
Hemoglobin: 16.6 g/dL (ref 13.2–17.1)
Lymphocytes Relative: 23 %
Lymphs Abs: 2645 cells/uL (ref 850–3900)
MCH: 33.5 pg — ABNORMAL HIGH (ref 27.0–33.0)
MCHC: 35.4 g/dL (ref 32.0–36.0)
MCV: 94.6 fL (ref 80.0–100.0)
MPV: 8.5 fL (ref 7.5–12.5)
Monocytes Absolute: 1035 cells/uL — ABNORMAL HIGH (ref 200–950)
Monocytes Relative: 9 %
Neutro Abs: 7475 cells/uL (ref 1500–7800)
Neutrophils Relative %: 65 %
Platelets: 500 10*3/uL — ABNORMAL HIGH (ref 140–400)
RBC: 4.96 MIL/uL (ref 4.20–5.80)
RDW: 13.3 % (ref 11.0–15.0)
WBC: 11.5 10*3/uL — ABNORMAL HIGH (ref 3.8–10.8)

## 2016-09-14 MED ORDER — PHENTERMINE HCL 37.5 MG PO TABS: 37.5000 mg | ORAL_TABLET | Freq: Every day | ORAL | 2 refills | Status: DC

## 2016-09-14 NOTE — Patient Instructions (Addendum)
Add the terbinafine cream do twice daily   Simple math prevails.    1st - exercise does not produce significant weight loss - at best one converts fat into muscle , "bulks up", loses inches, but usually stays "weight neutral"     2nd - think of your body weightas a check book: If you eat more calories than you burn up - you save money or gain weight .... Or if you spend more money than you put in the check book, ie burn up more calories than you eat, then you lose weight     3rd - if you walk or run 1 mile, you burn up 100 calories - you have to burn up 3,500 calories to lose 1 pound, ie you have to walk/run 35 miles to lose 1 measly pound. So if you want to lose 10 #, then you have to walk/run 350 miles, so.... clearly exercise is not the solution.     4. So if you consume 1,500 calories, then you have to burn up the equivalent of 15 miles to stay weight neutral - It also stands to reason that if you consume 1,500 cal/day and don't lose weight, then you must be burning up about 1,500 cals/day to stay weight neutral.     5. If you really want to lose weight, you must cut your calorie intake 300 calories /day and at that rate you should lose about 1 # every 3 days.   6. Please purchase Dr Francis DowseJoel Fuhrman's book(s) "The End of Dieting" & "Eat to Live" . It has some great concepts and recipes.     Drink at least 80 oz a day- 120 oz a day Eat AT LEAST 3 meals a day, people that eat breakfast have 20 percent less body fat  Phentermine  While taking the medication we may ask that you come into the office once a month or once every 2-3 months to monitor your weight, blood pressure, and heart rate. In addition we can help answer your questions about diet, exercise, and help you every step of the way with your weight loss journey. Sometime it is helpful if you bring in a food diary or use an app on your phone such as myfitnesspal to record your calorie intake, especially in the beginning.   You can  start out on 1/3 to 1/2 a pill in the morning and if you are tolerating it well you can increase to one pill daily. I also have some patients that take 1/3 or 1/2 at lunch to help prevent night time eating.  This medication is [facility] [employer] [facility] [employer] is 14-17 dollars and you do NOT need a membership to get meds from there.    What is this medicine? PHENTERMINE (FEN ter meen) decreases your appetite. This medicine is intended to be used in addition to a healthy reduced calorie diet and exercise. The best results are achieved this way. This medicine is only indicated for short-term use. Eventually your weight loss may level out and the medication will no longer be needed.   How should I use this medicine? Take this medicine by mouth. Follow the directions on the prescription label. The tablets should stay in the bottle until immediately before you take your dose. Take your doses at regular intervals. Do not take your medicine more often than directed.  Overdosage: If you think you have taken too much of this medicine contact a poison control center  or emergency room at once. NOTE: This medicine is only for you. Do not share this medicine with others.  What if I miss a dose? If you miss a dose, take it as soon as you can. If it is almost time for your next dose, take only that dose. Do not take double or extra doses. Do not increase or in any way change your dose without consulting your doctor.  What should I watch for while using this medicine? Notify your physician immediately if you become short of breath while doing your normal activities. Do not take this medicine within 6 hours of bedtime. It can keep you from getting to sleep. Avoid drinks that contain caffeine and try to stick to a regular bedtime every night. Do not stand or sit up quickly, especially if you are an older patient. This reduces the risk of dizzy or fainting spells. Avoid alcoholic drinks.  What  side effects may I notice from receiving this medicine? Side effects that you should report to your doctor or health care professional as soon as possible: -chest pain, palpitations -depression or severe changes in mood -increased blood pressure -irritability -nervousness or restlessness -severe dizziness -shortness of breath -problems urinating -unusual swelling of the legs -vomiting  Side effects that usually do not require medical attention (report to your doctor or health care professional if they continue or are bothersome): -blurred vision or other eye problems -changes in sexual ability or desire -constipation or diarrhea -difficulty sleeping -dry mouth or unpleasant taste -headache -nausea This list may not describe all possible side effects. Call your doctor for medical advice about side effects. You may report side effects to FDA at 1-800-FDA-1088.   We want weight loss that will last so you should lose 1-2 pounds a week.  THAT IS IT! Please pick THREE things a month to change. Once it is a habit check off the item. Then pick another three items off the list to become habits.  If you are already doing a habit on the list GREAT!  Cross that item off! o Don't drink your calories. Ie, alcohol, soda, fruit juice, and sweet tea.  o Drink more water. Drink a glass when you feel hungry or before each meal.  o Eat breakfast - Complex carb and protein (likeDannon light and fit yogurt, oatmeal, fruit, eggs, [place] bacon). o Measure your cereal.  Eat no more than one cup a day. (ie [place]) o Eat an apple a day. o Add a vegetable a day. o Try a new vegetable a month. o Use Pam! Stop using oil or butter to cook. o Don't finish your plate or use smaller plates. o Share your dessert. o Eat sugar free Jello for dessert or frozen grapes. o Don't eat 2-3 hours before bed. o Switch to whole wheat bread, pasta, and brown rice. o Make healthier choices when you eat out. No fries! o Pick  baked chicken, NOT fried. o Don't forget to SLOW DOWN when you eat. It is not going anywhere.  o Take the stairs. o Park far away in the parking lot o [employer] (or weights) for 10 minutes while watching TV. o Walk at work for 10 minutes during break. o Walk outside 1 time a week with your friend, kids, dog, or significant other. o Start a walking group at church. o Walk the mall as much as you can tolerate.  o Keep a food diary. o Weigh yourself daily. o Walk for 15 minutes 3  days per week. o Cook at home more often and eat out less.  If life happens and you go back to old habits, it is okay.  Just start over. You can do it!   If you experience chest pain, get short of breath, or tired during the exercise, please stop immediately and inform your doctor.

## 2016-09-15 ENCOUNTER — Other Ambulatory Visit: Payer: Self-pay | Admitting: Internal Medicine

## 2016-09-15 LAB — BASIC METABOLIC PANEL WITH GFR
BUN: 9 mg/dL (ref 7–25)
CO2: 25 mmol/L (ref 20–31)
Calcium: 9.9 mg/dL (ref 8.6–10.3)
Chloride: 104 mmol/L (ref 98–110)
Creat: 1.03 mg/dL (ref 0.60–1.35)
GFR, Est African American: >89 mL/min (ref >=60)
GFR, Est Non African American: 87 mL/min (ref >=60)
Glucose, Bld: 112 mg/dL — ABNORMAL HIGH (ref 65–99)
Potassium: 5 mmol/L (ref 3.5–5.3)
Sodium: 141 mmol/L (ref 135–146)

## 2016-09-15 LAB — HEPATIC FUNCTION PANEL
ALT: 28 U/L (ref 9–46)
AST: 22 U/L (ref 10–40)
Albumin: 4.4 g/dL (ref 3.6–5.1)
Alkaline Phosphatase: 58 U/L (ref 40–115)
Bilirubin, Direct: 0.2 mg/dL (ref <=0.2)
Indirect Bilirubin: 0.6 mg/dL (ref 0.2–1.2)
Total Bilirubin: 0.8 mg/dL (ref 0.2–1.2)
Total Protein: 6.9 g/dL (ref 6.1–8.1)

## 2016-09-15 LAB — TESTOSTERONE: Testosterone: 302 ng/dL (ref 250–827)

## 2016-09-15 LAB — GC/CHLAMYDIA PROBE AMP
CT Probe RNA: NOT DETECTED
GC Probe RNA: NOT DETECTED

## 2016-09-15 LAB — RPR

## 2016-09-15 LAB — HSV(HERPES SIMPLEX VRS) I + II AB-IGG
HSV 1 Glycoprotein G Ab, IgG: 3.31 Index — ABNORMAL HIGH (ref <0.90)
HSV 2 Glycoprotein G Ab, IgG: 2.96 Index — ABNORMAL HIGH (ref <0.90)

## 2016-09-15 LAB — HIV ANTIBODY (ROUTINE TESTING W REFLEX): HIV 1&2 Ab, 4th Generation: NONREACTIVE

## 2016-09-15 LAB — ESTRADIOL: Estradiol: 32 pg/mL (ref <=39)

## 2016-09-16 MED ORDER — VALACYCLOVIR HCL 1 G PO TABS: 1000.0000 mg | ORAL_TABLET | Freq: Two times a day (BID) | ORAL | 0 refills | Status: DC

## 2016-09-16 NOTE — Addendum Note (Signed)
Addended by: Quentin MullingOLLIER, Ambria Mayfield R on: 09/16/2016 05:42 PM   Modules accepted: Orders

## 2016-09-16 NOTE — Progress Notes (Signed)
LVM for pt to return office call for LAB results.

## 2016-09-16 NOTE — Progress Notes (Signed)
Pt states he would like to get a Rx for valtrex & he will pick it up in the AM.  Pt aware of lab results & voiced understanding of those results.

## 2016-09-17 ENCOUNTER — Other Ambulatory Visit: Payer: Self-pay

## 2016-09-17 MED ORDER — VALACYCLOVIR HCL 1 G PO TABS: 1000.0000 mg | ORAL_TABLET | Freq: Two times a day (BID) | ORAL | 0 refills | Status: DC

## 2016-09-25 ENCOUNTER — Emergency Department (HOSPITAL_COMMUNITY)
Admission: EM | Admit: 2016-09-25 | Discharge: 2016-09-25 | Disposition: A | Discharge status: Home / Self Care | Payer: BLUE CROSS/BLUE SHIELD | Attending: Emergency Medicine | Admitting: Emergency Medicine

## 2016-09-25 ENCOUNTER — Emergency Department (HOSPITAL_COMMUNITY): Payer: BLUE CROSS/BLUE SHIELD

## 2016-09-25 ENCOUNTER — Encounter (HOSPITAL_COMMUNITY): Payer: Self-pay | Admitting: Emergency Medicine

## 2016-09-25 DIAGNOSIS — R1011 Right upper quadrant pain: Secondary | ICD-10-CM | POA: Diagnosis present

## 2016-09-25 DIAGNOSIS — I10 Essential (primary) hypertension: Secondary | ICD-10-CM | POA: Diagnosis not present

## 2016-09-25 DIAGNOSIS — N200 Calculus of kidney: Secondary | ICD-10-CM | POA: Insufficient documentation

## 2016-09-25 DIAGNOSIS — Z79899 Other long term (current) drug therapy: Secondary | ICD-10-CM | POA: Insufficient documentation

## 2016-09-25 DIAGNOSIS — R109 Unspecified abdominal pain: Secondary | ICD-10-CM | POA: Diagnosis not present

## 2016-09-25 DIAGNOSIS — N201 Calculus of ureter: Secondary | ICD-10-CM | POA: Diagnosis not present

## 2016-09-25 LAB — CBC
HCT: 46 % (ref 39.0–52.0)
Hemoglobin: 16.8 g/dL (ref 13.0–17.0)
MCH: 33.9 pg (ref 26.0–34.0)
MCHC: 36.5 g/dL — ABNORMAL HIGH (ref 30.0–36.0)
MCV: 92.9 fL (ref 78.0–100.0)
Platelets: 467 10*3/uL — ABNORMAL HIGH (ref 150–400)
RBC: 4.95 MIL/uL (ref 4.22–5.81)
RDW: 12.1 % (ref 11.5–15.5)
WBC: 15.1 10*3/uL — ABNORMAL HIGH (ref 4.0–10.5)

## 2016-09-25 LAB — COMPREHENSIVE METABOLIC PANEL
ALT: 29 U/L (ref 17–63)
AST: 26 U/L (ref 15–41)
Albumin: 4.3 g/dL (ref 3.5–5.0)
Alkaline Phosphatase: 53 U/L (ref 38–126)
Anion gap: 8 (ref 5–15)
BUN: 13 mg/dL (ref 6–20)
CO2: 23 mmol/L (ref 22–32)
Calcium: 9.4 mg/dL (ref 8.9–10.3)
Chloride: 105 mmol/L (ref 101–111)
Creatinine, Ser: 1.3 mg/dL — ABNORMAL HIGH (ref 0.61–1.24)
GFR calc Af Amer: >60 mL/min (ref >60)
GFR calc non Af Amer: >60 mL/min (ref >60)
Glucose, Bld: 169 mg/dL — ABNORMAL HIGH (ref 65–99)
Potassium: 4 mmol/L (ref 3.5–5.1)
Sodium: 136 mmol/L (ref 135–145)
Total Bilirubin: 1.9 mg/dL — ABNORMAL HIGH (ref 0.3–1.2)
Total Protein: 7.1 g/dL (ref 6.5–8.1)

## 2016-09-25 LAB — LIPASE, BLOOD: Lipase: 51 U/L (ref 11–51)

## 2016-09-25 LAB — URINALYSIS, ROUTINE W REFLEX MICROSCOPIC
Bilirubin Urine: NEGATIVE
Glucose, UA: NEGATIVE mg/dL
Ketones, ur: 20 mg/dL — AB
Leukocytes, UA: NEGATIVE
Nitrite: NEGATIVE
Protein, ur: 30 mg/dL — AB
Specific Gravity, Urine: 1.026 (ref 1.005–1.030)
pH: 5 (ref 5.0–8.0)

## 2016-09-25 MED ORDER — OXYCODONE-ACETAMINOPHEN 5-325 MG PO TABS: 1.0000 | ORAL_TABLET | ORAL | 0 refills | Status: DC | PRN

## 2016-09-25 MED ORDER — SODIUM CHLORIDE 0.9 % IV BOLUS (SEPSIS)
1000.0000 mL | Freq: Once | INTRAVENOUS | Status: AC
  Administered 2016-09-25: 1000 mL via INTRAVENOUS

## 2016-09-25 MED ORDER — FENTANYL CITRATE (PF) 100 MCG/2ML IJ SOLN
50.0000 ug | Freq: Once | INTRAMUSCULAR | Status: AC
  Administered 2016-09-25: 50 ug via INTRAVENOUS
  Filled 2016-09-25: qty 2

## 2016-09-25 MED ORDER — TAMSULOSIN HCL 0.4 MG PO CAPS: 0.4000 mg | ORAL_CAPSULE | Freq: Every day | ORAL | 0 refills | Status: DC

## 2016-09-25 MED ORDER — HYDROMORPHONE HCL 1 MG/ML IJ SOLN
0.5000 mg | Freq: Once | INTRAMUSCULAR | Status: AC
  Administered 2016-09-25: 0.5 mg via INTRAVENOUS
  Filled 2016-09-25: qty 1

## 2016-09-25 MED ORDER — KETOROLAC TROMETHAMINE 30 MG/ML IJ SOLN
15.0000 mg | Freq: Once | INTRAMUSCULAR | Status: AC
  Administered 2016-09-25: 15 mg via INTRAVENOUS
  Filled 2016-09-25: qty 1

## 2016-09-25 MED ORDER — ONDANSETRON HCL 4 MG/2ML IJ SOLN
4.0000 mg | Freq: Once | INTRAMUSCULAR | Status: AC
  Administered 2016-09-25: 4 mg via INTRAVENOUS
  Filled 2016-09-25: qty 2

## 2016-09-25 MED ORDER — ONDANSETRON 4 MG PO TBDP: ORAL_TABLET | ORAL | 0 refills | Status: DC

## 2016-09-25 NOTE — ED Notes (Signed)
Bed: WA02 Expected date:  Expected time:  Means of arrival:  Comments: 

## 2016-09-25 NOTE — ED Notes (Signed)
Pt at CT

## 2016-09-25 NOTE — ED Provider Notes (Signed)
[facility]-EMERGENCY DEPT Provider Note   CSN: [id number] Arrival date & time: 09/25/16  4098     History   Chief Complaint Chief Complaint  Patient presents with  . Abdominal Pain    HPI Noah Dennis is a 46 y.o. male.  Patient is a 72 are all male with a history of hypertension, hyperlipidemia and prediabetes who presents with abdominal pain. He states that one hour prior to arrival he had sudden onset of pain in his right midabdomen. It's nonradiating. He has associated nausea and vomiting. No change in bowel habits. No difficulty urinating. No fevers. No history of similar symptoms in the past. He has not yet taken anything for the pain. He denies any prior abdominal surgeries. No prior history of kidney stones.      Past Medical History:  Diagnosis Date  . Hyperlipidemia   . Hypertension   . Hypogonadism male   . Insulin resistance   . Pre-diabetes     Patient Active Problem List   Diagnosis Date Noted  . Other fatigue 05/09/2014  . Morbid obesity (BMI 34.31) 02/13/2014  . Testosterone deficiency 02/13/2014  . Medication management 11/08/2013  . Vitamin D deficiency 05/07/2013  . Essential hypertension 04/25/2013  . Mixed hyperlipidemia 04/25/2013  . Rosacea 04/25/2013  . Insulin resistance 04/25/2013    Past Surgical History:  Procedure Laterality Date  . SPLENECTOMY         Home Medications    Prior to Admission medications   Medication Sig Start Date End Date Taking? Authorizing Provider  ANDROGEL PUMP 20.25 MG/ACT (1.62%) GEL APPLY 4 PUMPS DAILY AS DIRECTED 07/21/16  Yes [name], MD  Cholecalciferol (VITAMIN D-3) 5000 units TABS Take 5,000 Units by mouth 2 (two) times daily.   Yes [provider]  doxycycline (VIBRAMYCIN) 100 MG capsule TAKE 1 CAPSULE (100 MG TOTAL) BY MOUTH 2 (TWO) TIMES DAILY. TAKES FOR ROSACEA. 07/22/16  Yes [name], MD  finasteride (PROSCAR) 5 MG tablet TAKE 1 TABLET (5 MG TOTAL) BY MOUTH DAILY. 04/07/16  Yes  [name], MD  lisinopril (PRINIVIL,ZESTRIL) 20 MG tablet TAKE ONE TABLET BY MOUTH ONE TIME DAILY 09/15/16  Yes [name], MD  phentermine (ADIPEX-P) 37.5 MG tablet Take 1 tablet (37.5 mg total) by mouth daily before breakfast. 09/14/16  Yes Quentin Mulling, PA-C  rosuvastatin (CRESTOR) 40 MG tablet TAKE ONE TABLET BY MOUTH ONE TIME DAILY FOR CHOLESTEROL 08/12/16  Yes [name], MD  terbinafine (LAMISIL) 250 MG tablet TAKE 1 TABLET (250 MG TOTAL) BY MOUTH DAILY. 09/15/16  Yes [name], MD  valACYclovir (VALTREX) 1000 MG tablet Take 1 tablet (1,000 mg total) by mouth 2 (two) times daily. For 10 days.  Start within 72 hours of symptom onset. 09/17/16  Yes Quentin Mulling, PA-C  ondansetron (ZOFRAN ODT) 4 MG disintegrating tablet 4mg  ODT q4 hours prn nausea/vomit 09/25/16   Rolan Bucco, MD  oxyCODONE-acetaminophen (PERCOCET) 5-325 MG tablet Take 1-2 tablets by mouth every 4 (four) hours as needed. 09/25/16   Rolan Bucco, MD  tamsulosin (FLOMAX) 0.4 MG CAPS capsule Take 1 capsule (0.4 mg total) by mouth daily. 09/25/16   Rolan Bucco, MD    Family History Family History  Problem Relation Age of Onset  . Diabetes Mother   . Hypertension Mother   . Heart disease Mother   . Hypertension Father   . Cancer Father     Social History Social History  Substance Use Topics  . Smoking status: Never Smoker  . Smokeless tobacco:  Never Used  . Alcohol use Yes     Comment: occasionally     Allergies   Patient has no known allergies.   Review of Systems Review of Systems  Constitutional: Negative for chills, diaphoresis, fatigue and fever.  HENT: Negative for congestion, rhinorrhea and sneezing.   Eyes: Negative.   Respiratory: Negative for cough, chest tightness and shortness of breath.   Cardiovascular: Negative for chest pain and leg swelling.  Gastrointestinal: Positive for abdominal pain, nausea and vomiting. Negative for blood in stool and diarrhea.    Genitourinary: Negative for difficulty urinating, flank pain, frequency and hematuria.  Musculoskeletal: Negative for arthralgias and back pain.  Skin: Negative for rash.  Neurological: Negative for dizziness, speech difficulty, weakness, numbness and headaches.     Physical Exam Updated Vital Signs BP 123/82 (BP Location: Right Arm)   Pulse 93   Temp 97.8 F (36.6 C) (Oral)   Resp 16   Ht 5' 9.5" (1.765 m)   Wt 109.3 kg (241 lb)   SpO2 99%   BMI 35.08 kg/m   Physical Exam  Constitutional: He is oriented to person, place, and time. He appears well-developed and well-nourished.  HENT:  Head: Normocephalic and atraumatic.  Eyes: Pupils are equal, round, and reactive to light.  Neck: Normal range of motion. Neck supple.  Cardiovascular: Normal rate, regular rhythm and normal heart sounds.   Pulmonary/Chest: Effort normal and breath sounds normal. No respiratory distress. He has no wheezes. He has no rales. He exhibits no tenderness.  Abdominal: Soft. Bowel sounds are normal. There is tenderness (Positive tenderness throughout the right abdomen in the upper and lower quadrants). There is no rebound and no guarding.  Genitourinary:  Genitourinary Comments: No pain in the inguinal areas or scrotum  Musculoskeletal: Normal range of motion. He exhibits no edema.  Lymphadenopathy:    He has no cervical adenopathy.  Neurological: He is alert and oriented to person, place, and time.  Skin: Skin is warm and dry. No rash noted.  Psychiatric: He has a normal mood and affect.     ED Treatments / Results  Labs (all labs ordered are listed, but only abnormal results are displayed) Labs Reviewed  COMPREHENSIVE METABOLIC PANEL - Abnormal; Notable for the following:       Result Value   Glucose, Bld 169 (*)    Creatinine, Ser 1.30 (*)    Total Bilirubin 1.9 (*)    All other components within normal limits  CBC - Abnormal; Notable for the following:    WBC 15.1 (*)    MCHC 36.5 (*)     Platelets 467 (*)    All other components within normal limits  URINALYSIS, ROUTINE W REFLEX MICROSCOPIC - Abnormal; Notable for the following:    Color, Urine AMBER (*)    APPearance HAZY (*)    Hgb urine dipstick SMALL (*)    Ketones, ur 20 (*)    Protein, ur 30 (*)    Bacteria, UA FEW (*)    Squamous Epithelial / LPF 0-5 (*)    All other components within normal limits  LIPASE, BLOOD    EKG  EKG Interpretation None       Radiology Ct Renal Stone Study  Result Date: 09/25/2016 CLINICAL DATA:  Right-sided abdominal pain.  Hematuria EXAM: CT ABDOMEN AND PELVIS WITHOUT CONTRAST TECHNIQUE: Multidetector CT imaging of the abdomen and pelvis was performed following the standard protocol without IV contrast. COMPARISON:  None. FINDINGS: Lower chest:  No contributory  findings. Hepatobiliary: No focal liver abnormality.No evidence of biliary obstruction or stone. Pancreas: Unremarkable. Spleen: Surgically absent Adrenals/Urinary Tract: Negative adrenals. Moderate right hydronephrosis secondary to a 6 x 5 mm stone in the upper right ureter, at the level of the L3 vertebra. Right renal expansion, low-density, and perinephric edema. Punctate left renal calculus on coronal reformats. No left hydronephrosis Unremarkable bladder. Stomach/Bowel:  No obstruction. No appendicitis. Vascular/Lymphatic: No acute vascular abnormality. No mass or adenopathy. Surgical clips in the right groin Reproductive:Negative. Other: No ascites or pneumoperitoneum. Musculoskeletal: No acute abnormalities. Chronic bilateral L5 pars defects with anterolisthesis and accelerated disc degeneration. Bilateral foraminal stenosis. IMPRESSION: 1. Obstructing 6 x 5 mm upper right ureteral stone. 2. Punctate left renal calculus. Electronically Signed   By: Marnee Spring M.D.   On: 09/25/2016 13:55    Procedures Procedures (including critical care time)  Medications Ordered in ED Medications  fentaNYL (SUBLIMAZE) injection 50  mcg (50 mcg Intravenous Given 09/25/16 1058)  ondansetron (ZOFRAN) injection 4 mg (4 mg Intravenous Given 09/25/16 1058)  sodium chloride 0.9 % bolus 1,000 mL (0 mLs Intravenous Stopped 09/25/16 1219)  fentaNYL (SUBLIMAZE) injection 50 mcg (50 mcg Intravenous Given 09/25/16 1246)  ketorolac (TORADOL) 30 MG/ML injection 15 mg (15 mg Intravenous Given 09/25/16 1447)  HYDROmorphone (DILAUDID) injection 0.5 mg (0.5 mg Intravenous Given 09/25/16 1446)     Initial Impression / Assessment and Plan / ED Course  I have reviewed the triage vital signs and the nursing notes.  Pertinent labs & imaging results that were available during my care of the patient were reviewed by me and considered in my medical decision making (see chart for details).     Patient presents with flank pain. There is a large proximal ureteral stone on CT. There is no evidence of infection. His creatinine is mildly elevated as compared to his prior value. Patient's pain is controlled in the ED. He's afebrile. He is requesting that we possibly admit him to take care of the stone. I did consult urology, Dr. Sherryl Barters, with these findings. He advises the patient can call make an appointment to follow-up in office. Patient was informed of this advice. He was given prescriptions for Flomax, Percocet and Zofran. He was advised he can also use ibuprofen and heating pads. He was encouraged to have close follow-up with [facility]. Return precautions were given.  Final Clinical Impressions(s) / ED Diagnoses   Final diagnoses:  Kidney stone    New Prescriptions New Prescriptions   ONDANSETRON (ZOFRAN ODT) 4 MG DISINTEGRATING TABLET    4mg  ODT q4 hours prn nausea/vomit   OXYCODONE-ACETAMINOPHEN (PERCOCET) 5-325 MG TABLET    Take 1-2 tablets by mouth every 4 (four) hours as needed.   TAMSULOSIN (FLOMAX) 0.4 MG CAPS CAPSULE    Take 1 capsule (0.4 mg total) by mouth daily.     Rolan Bucco, MD 09/25/16 [phone]

## 2016-09-25 NOTE — ED Triage Notes (Signed)
Per [facility] pt from home c/o right sided abdominal pain onset of 830 this am. No N/V/D until ride with EMS pt has small amount of emesis. Pt tender to right side.

## 2016-09-28 DIAGNOSIS — N201 Calculus of ureter: Secondary | ICD-10-CM | POA: Diagnosis not present

## 2016-10-02 ENCOUNTER — Other Ambulatory Visit: Payer: Self-pay | Admitting: Internal Medicine

## 2016-10-06 DIAGNOSIS — N201 Calculus of ureter: Secondary | ICD-10-CM | POA: Diagnosis not present

## 2016-12-09 ENCOUNTER — Other Ambulatory Visit: Payer: Self-pay | Admitting: Internal Medicine

## 2016-12-17 ENCOUNTER — Ambulatory Visit: Payer: Self-pay | Admitting: Internal Medicine

## 2016-12-17 NOTE — Progress Notes (Signed)
NO SHOW

## 2017-01-14 ENCOUNTER — Ambulatory Visit: Payer: Self-pay | Admitting: Internal Medicine

## 2017-01-25 ENCOUNTER — Ambulatory Visit: Payer: Self-pay | Admitting: Internal Medicine

## 2017-01-30 ENCOUNTER — Other Ambulatory Visit: Payer: Self-pay | Admitting: Internal Medicine

## 2017-01-30 DIAGNOSIS — E349 Endocrine disorder, unspecified: Secondary | ICD-10-CM

## 2017-01-30 NOTE — Telephone Encounter (Signed)
Please call Androgel 

## 2017-02-03 ENCOUNTER — Other Ambulatory Visit: Payer: Self-pay | Admitting: Internal Medicine

## 2017-02-07 ENCOUNTER — Other Ambulatory Visit: Payer: Self-pay | Admitting: Internal Medicine

## 2017-03-10 ENCOUNTER — Other Ambulatory Visit: Payer: Self-pay | Admitting: Physician Assistant

## 2017-03-12 ENCOUNTER — Other Ambulatory Visit: Payer: Self-pay | Admitting: Internal Medicine

## 2017-03-31 ENCOUNTER — Other Ambulatory Visit: Payer: Self-pay | Admitting: Internal Medicine

## 2017-04-03 ENCOUNTER — Other Ambulatory Visit: Payer: Self-pay | Admitting: Physician Assistant

## 2017-04-06 ENCOUNTER — Ambulatory Visit (INDEPENDENT_AMBULATORY_CARE_PROVIDER_SITE_OTHER): Payer: BLUE CROSS/BLUE SHIELD | Admitting: Physician Assistant

## 2017-04-06 VITALS — BP 116/82 | HR 76 | Temp 97.5°F | Resp 18 | Ht 69.5 in | Wt 243.0 lb

## 2017-04-06 DIAGNOSIS — E349 Endocrine disorder, unspecified: Secondary | ICD-10-CM | POA: Diagnosis not present

## 2017-04-06 DIAGNOSIS — I10 Essential (primary) hypertension: Secondary | ICD-10-CM

## 2017-04-06 DIAGNOSIS — E559 Vitamin D deficiency, unspecified: Secondary | ICD-10-CM | POA: Diagnosis not present

## 2017-04-06 DIAGNOSIS — E782 Mixed hyperlipidemia: Secondary | ICD-10-CM

## 2017-04-06 DIAGNOSIS — Z79899 Other long term (current) drug therapy: Secondary | ICD-10-CM

## 2017-04-06 MED ORDER — PHENTERMINE HCL 37.5 MG PO TABS: 37.5000 mg | ORAL_TABLET | Freq: Every day | ORAL | 2 refills | Status: DC

## 2017-04-06 NOTE — Progress Notes (Signed)
Assessment and Plan:  Hypertension:  -Continue medication,  -monitor blood pressure at home.  -Continue DASH diet.   -Reminder to go to the ER if any CP, SOB, nausea, dizziness, severe HA, changes vision/speech, left arm numbness and tingling, and jaw pain.  Cholesterol: -Continue diet and exercise.  -Check cholesterol.   Pre-diabetes: -Continue diet and exercise.  -Check A1C  Vitamin D Def: -check level -continue medications.   Testosterone Def -androgel -check testosterone level - check estrogen level  Morbid Obesity with co morbidities - follow up 3 months for progress monitoring - increase veggies, decrease carbs - long discussion about weight loss, diet, and exercise  Continue diet and meds as discussed. Further disposition pending results of labs. Future Appointments  Date Time Provider Department Center  06/24/2017  3:00 PM Lucky CowboyMcKeown, William, MD [facility] None    HPI 46 y.o. male  presents for 3 month follow up with hypertension, hyperlipidemia, prediabetes and vitamin D.   His blood pressure has been controlled at home, today their BP is BP: 116/82.   He does workout. He denies chest pain, shortness of breath, dizziness.   He is on cholesterol medication and denies myalgias. His cholesterol is at goal. The cholesterol last visit was:   Lab Results  Component Value Date   CHOL 150 06/15/2016   HDL 42 06/15/2016   LDLCALC 71 06/15/2016   TRIG 184 (H) 06/15/2016   CHOLHDL 3.6 06/15/2016    He has been working on diet and exercise for prediabetes, and denies foot ulcerations, hyperglycemia, hypoglycemia , increased appetite, nausea, paresthesia of the feet, polydipsia, polyuria, visual disturbances, vomiting and weight loss. Last A1C in the office was:  Lab Results  Component Value Date   HGBA1C 5.3 06/15/2016   Patient is on Vitamin D supplement.  Lab Results  Component Value Date   VD25OH 6663 06/15/2016    He reports that he is still doing 4 pumps of  androgel daily.  He has a history of testosterone deficiency and is on testosterone replacement. He states that the testosterone helps with his energy, libido, muscle mass. Lab Results  Component Value Date   TESTOSTERONE 302 09/14/2016    BMI is Body mass index is 35.37 kg/m., he is working on diet and exercise. He is not taking the phentermine, got a treadmill and wants to do 30 mins a day.  Wt Readings from Last 3 Encounters:  04/06/17 243 lb (110.2 kg)  09/25/16 241 lb (109.3 kg)  09/14/16 241 lb 6.4 oz (109.5 kg)    Current Medications:  Current Outpatient Medications on File Prior to Visit  Medication Sig Dispense Refill  . ANDROGEL PUMP 20.25 MG/ACT (1.62%) GEL APPLY 4 PUMPS DAILY AS DIRECTED 450 g 1  . Cholecalciferol (VITAMIN D-3) 5000 units TABS Take 5,000 Units by mouth 2 (two) times daily.    Marland Kitchen. doxycycline (VIBRAMYCIN) 100 MG capsule TAKE 1 CAPSULE (100 MG TOTAL) BY MOUTH 2 (TWO) TIMES DAILY FOR ROSACEA. 180 capsule 1  . finasteride (PROSCAR) 5 MG tablet TAKE 1 TABLET (5 MG TOTAL) BY MOUTH DAILY. 90 tablet 1  . lisinopril (PRINIVIL,ZESTRIL) 20 MG tablet TAKE ONE TABLET BY MOUTH ONE TIME DAILY 90 tablet 1  . rosuvastatin (CRESTOR) 40 MG tablet TAKE ONE TABLET BY MOUTH ONE TIME DAILY FOR CHOLESTEROL 90 tablet 1  . tamsulosin (FLOMAX) 0.4 MG CAPS capsule Take 1 capsule (0.4 mg total) by mouth daily. 10 capsule 0  . terbinafine (LAMISIL) 250 MG tablet TAKE 1 TABLET BY  MOUTH EVERY DAY 90 tablet 0  . phentermine (ADIPEX-P) 37.5 MG tablet Take 1 tablet (37.5 mg total) by mouth daily before breakfast. (Patient not taking: Reported on 04/06/2017) 30 tablet 2   No current facility-administered medications on file prior to visit.     Medical History:  Past Medical History:  Diagnosis Date  . Hyperlipidemia   . Hypertension   . Hypogonadism male   . Insulin resistance   . Pre-diabetes     Allergies: No Known Allergies   Review of Systems:  Review of Systems   Constitutional: Negative for chills, fever and malaise/fatigue.  HENT: Positive for congestion. Negative for ear pain and sore throat.   Eyes: Negative.   Respiratory: Negative for cough, shortness of breath and wheezing.   Cardiovascular: Negative for chest pain, palpitations and leg swelling.  Gastrointestinal: Negative for blood in stool, constipation, diarrhea, heartburn and melena.  Genitourinary: Negative.   Neurological: Negative for dizziness, loss of consciousness and headaches.  Psychiatric/Behavioral: Negative for depression. The patient is not nervous/anxious and does not have insomnia.     Family history- Review and unchanged  Social history- Review and unchanged  Physical Exam: BP 116/82   Pulse 76   Temp (!) 97.5 F (36.4 C)   Resp 18   Ht 5' 9.5" (1.765 m)   Wt 243 lb (110.2 kg)   BMI 35.37 kg/m  Wt Readings from Last 3 Encounters:  04/06/17 243 lb (110.2 kg)  09/25/16 241 lb (109.3 kg)  09/14/16 241 lb 6.4 oz (109.5 kg)    General Appearance: Well nourished well developed, in no apparent distress. Eyes: PERRLA, EOMs, conjunctiva no swelling or erythema ENT/Mouth: Ear canals normal without obstruction, swelling, erythma, discharge.  TMs normal bilaterally.  Oropharynx moist, clear, without exudate, or postoropharyngeal swelling. Neck: Supple, thyroid normal,no cervical adenopathy  Respiratory: Respiratory effort normal, Breath sounds clear A&P without rhonchi, wheeze, or rale.  No retractions, no accessory usage. Cardio: RRR with no MRGs. Brisk peripheral pulses without edema.  Abdomen: Soft, + BS,  Non tender, no guarding, rebound, hernias, masses. Musculoskeletal: Full ROM, 5/5 strength, Normal gait Skin: Warm, dry without rashes, lesions, ecchymosis.  Neuro: Awake and oriented X 3, Cranial nerves intact. Normal muscle tone, no cerebellar symptoms. Psych: Normal affect, Insight and Judgment appropriate.    Quentin MullingAmanda Omauri Boeve, PA-C 2:54 PM [facility]

## 2017-04-06 NOTE — Patient Instructions (Signed)
Simple math prevails.    1st - exercise does not produce significant weight loss - at best one converts fat into muscle , "bulks up", loses inches, but usually stays "weight neutral"     2nd - think of your body weightas a check book: If you eat more calories than you burn up - you save money or gain weight .... Or if you spend more money than you put in the check book, ie burn up more calories than you eat, then you lose weight     3rd - if you walk or run 1 mile, you burn up 100 calories - you have to burn up 3,500 calories to lose 1 pound, ie you have to walk/run 35 miles to lose 1 measly pound. So if you want to lose 10 #, then you have to walk/run 350 miles, so.... clearly exercise is not the solution.     4. So if you consume 1,500 calories, then you have to burn up the equivalent of 15 miles to stay weight neutral - It also stands to reason that if you consume 1,500 cal/day and don't lose weight, then you must be burning up about 1,500 cals/day to stay weight neutral.     5. If you really want to lose weight, you must cut your calorie intake 300 calories /day and at that rate you should lose about 1 # every 3 days.   6. Please purchase Dr Francis DowseJoel Fuhrman's book(s) "The End of Dieting" & "Eat to Live" . It has some great concepts and recipes.      Phentermine  While taking the medication we may ask that you come into the office once a month or once every 2-3 months to monitor your weight, blood pressure, and heart rate. In addition we can help answer your questions about diet, exercise, and help you every step of the way with your weight loss journey. Sometime it is helpful if you bring in a food diary or use an app on your phone such as myfitnesspal to record your calorie intake, especially in the beginning.   You can start out on 1/3 to 1/2 a pill in the morning and if you are tolerating it well you can increase to one pill daily. I also have some patients that take 1/3 or 1/2 at lunch to  help prevent night time eating.  This medication is [facility] [employer] [facility] is 14-17 dollars and you do NOT need a membership to get meds from there.    What is this medicine? PHENTERMINE (FEN ter meen) decreases your appetite. This medicine is intended to be used in addition to a healthy reduced calorie diet and exercise. The best results are achieved this way. This medicine is only indicated for short-term use. Eventually your weight loss may level out and the medication will no longer be needed.   How should I use this medicine? Take this medicine by mouth. Follow the directions on the prescription label. The tablets should stay in the bottle until immediately before you take your dose. Take your doses at regular intervals. Do not take your medicine more often than directed.  Overdosage: If you think you have taken too much of this medicine contact a poison control center or emergency room at once. NOTE: This medicine is only for you. Do not share this medicine with others.  What if I miss a dose? If you miss a dose, take it as soon as you  can. If it is almost time for your next dose, take only that dose. Do not take double or extra doses. Do not increase or in any way change your dose without consulting your doctor.  What should I watch for while using this medicine? Notify your physician immediately if you become short of breath while doing your normal activities. Do not take this medicine within 6 hours of bedtime. It can keep you from getting to sleep. Avoid drinks that contain caffeine and try to stick to a regular bedtime every night. Do not stand or sit up quickly, especially if you are an older patient. This reduces the risk of dizzy or fainting spells. Avoid alcoholic drinks.  What side effects may I notice from receiving this medicine? Side effects that you should report to your doctor or health care professional as soon as possible: -chest pain,  palpitations -depression or severe changes in mood -increased blood pressure -irritability -nervousness or restlessness -severe dizziness -shortness of breath -problems urinating -unusual swelling of the legs -vomiting  Side effects that usually do not require medical attention (report to your doctor or health care professional if they continue or are bothersome): -blurred vision or other eye problems -changes in sexual ability or desire -constipation or diarrhea -difficulty sleeping -dry mouth or unpleasant taste -headache -nausea This list may not describe all possible side effects. Call your doctor for medical advice about side effects. You may report side effects to FDA at 1-800-FDA-1088.

## 2017-04-07 LAB — LIPID PANEL
Cholesterol: 172 mg/dL (ref <200)
HDL: 45 mg/dL (ref >40)
LDL Cholesterol (Calc): 96 mg/dL (calc)
Non-HDL Cholesterol (Calc): 127 mg/dL (calc) (ref <130)
Total CHOL/HDL Ratio: 3.8 (calc) (ref <5.0)
Triglycerides: 223 mg/dL — ABNORMAL HIGH (ref <150)

## 2017-04-07 LAB — TSH: TSH: 1.73 mIU/L (ref 0.40–4.50)

## 2017-04-07 LAB — BASIC METABOLIC PANEL WITH GFR
BUN: 13 mg/dL (ref 7–25)
CO2: 28 mmol/L (ref 20–32)
Calcium: 9.5 mg/dL (ref 8.6–10.3)
Chloride: 102 mmol/L (ref 98–110)
Creat: 1.09 mg/dL (ref 0.60–1.35)
GFR, Est African American: 94 mL/min/{1.73_m2} (ref >=60)
GFR, Est Non African American: 81 mL/min/{1.73_m2} (ref >=60)
Glucose, Bld: 116 mg/dL — ABNORMAL HIGH (ref 65–99)
Potassium: 4.4 mmol/L (ref 3.5–5.3)
Sodium: 138 mmol/L (ref 135–146)

## 2017-04-07 LAB — HEPATIC FUNCTION PANEL
AG Ratio: 1.7 (calc) (ref 1.0–2.5)
ALT: 30 U/L (ref 9–46)
AST: 22 U/L (ref 10–40)
Albumin: 4.3 g/dL (ref 3.6–5.1)
Alkaline phosphatase (APISO): 54 U/L (ref 40–115)
Bilirubin, Direct: 0.1 mg/dL (ref 0.0–0.2)
Globulin: 2.5 g/dL (calc) (ref 1.9–3.7)
Indirect Bilirubin: 0.5 mg/dL (calc) (ref 0.2–1.2)
Total Bilirubin: 0.6 mg/dL (ref 0.2–1.2)
Total Protein: 6.8 g/dL (ref 6.1–8.1)

## 2017-04-07 LAB — CBC WITH DIFFERENTIAL/PLATELET
Basophils Absolute: 163 cells/uL (ref 0–200)
Basophils Relative: 1.2 %
Eosinophils Absolute: 558 cells/uL — ABNORMAL HIGH (ref 15–500)
Eosinophils Relative: 4.1 %
HCT: 44.6 % (ref 38.5–50.0)
Hemoglobin: 16.2 g/dL (ref 13.2–17.1)
Lymphs Abs: 3468 cells/uL (ref 850–3900)
MCH: 32.9 pg (ref 27.0–33.0)
MCHC: 36.3 g/dL — ABNORMAL HIGH (ref 32.0–36.0)
MCV: 90.5 fL (ref 80.0–100.0)
MPV: 9 fL (ref 7.5–12.5)
Monocytes Relative: 9.9 %
Neutro Abs: 8065 cells/uL — ABNORMAL HIGH (ref 1500–7800)
Neutrophils Relative %: 59.3 %
Platelets: 498 10*3/uL — ABNORMAL HIGH (ref 140–400)
RBC: 4.93 10*6/uL (ref 4.20–5.80)
RDW: 12.3 % (ref 11.0–15.0)
Total Lymphocyte: 25.5 %
WBC mixed population: 1346 cells/uL — ABNORMAL HIGH (ref 200–950)
WBC: 13.6 10*3/uL — ABNORMAL HIGH (ref 3.8–10.8)

## 2017-04-07 LAB — TESTOSTERONE: Testosterone: 410 ng/dL (ref 250–827)

## 2017-04-07 LAB — MAGNESIUM: Magnesium: 1.7 mg/dL (ref 1.5–2.5)

## 2017-05-10 ENCOUNTER — Other Ambulatory Visit: Payer: Self-pay | Admitting: *Deleted

## 2017-05-10 DIAGNOSIS — E349 Endocrine disorder, unspecified: Secondary | ICD-10-CM

## 2017-05-10 MED ORDER — TESTOSTERONE 20.25 MG/ACT (1.62%) TD GEL: TRANSDERMAL | 1 refills | Status: DC

## 2017-05-17 ENCOUNTER — Other Ambulatory Visit: Payer: Self-pay | Admitting: *Deleted

## 2017-05-17 DIAGNOSIS — E349 Endocrine disorder, unspecified: Secondary | ICD-10-CM

## 2017-05-17 MED ORDER — TESTOSTERONE 20.25 MG/ACT (1.62%) TD GEL: TRANSDERMAL | 1 refills | Status: DC

## 2017-05-19 ENCOUNTER — Other Ambulatory Visit: Payer: Self-pay | Admitting: *Deleted

## 2017-05-19 DIAGNOSIS — E349 Endocrine disorder, unspecified: Secondary | ICD-10-CM

## 2017-05-19 MED ORDER — TESTOSTERONE CYPIONATE 200 MG/ML IM SOLN: INTRAMUSCULAR | 1 refills | Status: DC

## 2017-06-08 ENCOUNTER — Other Ambulatory Visit: Payer: Self-pay | Admitting: Internal Medicine

## 2017-06-09 ENCOUNTER — Other Ambulatory Visit: Payer: Self-pay | Admitting: Internal Medicine

## 2017-06-24 ENCOUNTER — Encounter: Payer: Self-pay | Admitting: Internal Medicine

## 2017-07-29 ENCOUNTER — Ambulatory Visit (INDEPENDENT_AMBULATORY_CARE_PROVIDER_SITE_OTHER): Payer: BLUE CROSS/BLUE SHIELD | Admitting: Internal Medicine

## 2017-07-29 ENCOUNTER — Encounter: Payer: Self-pay | Admitting: Internal Medicine

## 2017-07-29 VITALS — BP 108/78 | HR 88 | Temp 97.8°F | Resp 18 | Ht 69.5 in | Wt 242.8 lb

## 2017-07-29 DIAGNOSIS — Z130 Encounter for screening for diseases of the blood and blood-forming organs and certain disorders involving the immune mechanism: Secondary | ICD-10-CM

## 2017-07-29 DIAGNOSIS — E782 Mixed hyperlipidemia: Secondary | ICD-10-CM | POA: Diagnosis not present

## 2017-07-29 DIAGNOSIS — Z1389 Encounter for screening for other disorder: Secondary | ICD-10-CM | POA: Diagnosis not present

## 2017-07-29 DIAGNOSIS — Z136 Encounter for screening for cardiovascular disorders: Secondary | ICD-10-CM

## 2017-07-29 DIAGNOSIS — R5383 Other fatigue: Secondary | ICD-10-CM

## 2017-07-29 DIAGNOSIS — Z0001 Encounter for general adult medical examination with abnormal findings: Secondary | ICD-10-CM

## 2017-07-29 DIAGNOSIS — Z8249 Family history of ischemic heart disease and other diseases of the circulatory system: Secondary | ICD-10-CM

## 2017-07-29 DIAGNOSIS — E559 Vitamin D deficiency, unspecified: Secondary | ICD-10-CM | POA: Diagnosis not present

## 2017-07-29 DIAGNOSIS — Z131 Encounter for screening for diabetes mellitus: Secondary | ICD-10-CM | POA: Diagnosis not present

## 2017-07-29 DIAGNOSIS — Z23 Encounter for immunization: Secondary | ICD-10-CM | POA: Diagnosis not present

## 2017-07-29 DIAGNOSIS — Z125 Encounter for screening for malignant neoplasm of prostate: Secondary | ICD-10-CM | POA: Diagnosis not present

## 2017-07-29 DIAGNOSIS — E349 Endocrine disorder, unspecified: Secondary | ICD-10-CM | POA: Diagnosis not present

## 2017-07-29 DIAGNOSIS — I10 Essential (primary) hypertension: Secondary | ICD-10-CM

## 2017-07-29 DIAGNOSIS — Z1329 Encounter for screening for other suspected endocrine disorder: Secondary | ICD-10-CM | POA: Diagnosis not present

## 2017-07-29 DIAGNOSIS — Z1211 Encounter for screening for malignant neoplasm of colon: Secondary | ICD-10-CM

## 2017-07-29 DIAGNOSIS — Z79899 Other long term (current) drug therapy: Secondary | ICD-10-CM | POA: Diagnosis not present

## 2017-07-29 DIAGNOSIS — Z13220 Encounter for screening for lipoid disorders: Secondary | ICD-10-CM

## 2017-07-29 DIAGNOSIS — Z0000 Encounter for general adult medical examination without abnormal findings: Secondary | ICD-10-CM | POA: Diagnosis not present

## 2017-07-29 DIAGNOSIS — Z111 Encounter for screening for respiratory tuberculosis: Secondary | ICD-10-CM

## 2017-07-29 DIAGNOSIS — E8881 Metabolic syndrome: Secondary | ICD-10-CM

## 2017-07-29 DIAGNOSIS — Z1212 Encounter for screening for malignant neoplasm of rectum: Secondary | ICD-10-CM

## 2017-07-29 NOTE — Patient Instructions (Signed)

## 2017-07-29 NOTE — Progress Notes (Signed)
[facility]   [name], M.D.     [name]. [name], P.A.-C Judd Gaudier, DNP [facility]                [address]                [place], [place]. [address] Telephone [phone] Telefax [phone] Annual  Screening/Preventative Visit  & Comprehensive Evaluation & Examination     This very nice 47 y.o. single WM presents for a Screening/Preventative Visit & comprehensive evaluation and management of multiple medical co-morbidities.  Patient has been followed for HTN, HLD, T2_NIDDM  Prediabetes and Vitamin D Deficiency.     HTN predates since 2005. Patient's BP has been controlled at home.  Today's BP is at goal - 108/78. Patient denies any cardiac symptoms as chest pain, palpitations, shortness of breath, dizziness or ankle swelling.     Patient's hyperlipidemia is controlled with diet and medications. Patient denies myalgias or other medication SE's. Last lipids were at goal albeit elevated Trig's: Lab Results  Component Value Date   CHOL 172 04/06/2017   HDL 45 04/06/2017   LDLCALC 96 04/06/2017   TRIG 223 (H) 04/06/2017   CHOLHDL 3.8 04/06/2017      Patient has Morbid Obesity (BMI 35+) and prediabetes/Insulin Resistance (A1c4.8%/Insulin 77 in 2011 and A1c5.0%/ Insulin 133 in 2014) and patient denies reactive hypoglycemic symptoms, visual blurring, diabetic polys or paresthesias. Last A1c was Normal & at goal: Lab Results  Component Value Date   HGBA1C 5.3 06/15/2016       Patient has Testosterone Deficiency ("185"/2013 and "239"/2015) and sometimes uses Androgel & is transitioning to Depo-Testosterone due to expense of the Gel. Finally, patient has history of Vitamin D Deficiency  and last vitamin D was at goal: Lab Results  Component Value Date   VD25OH 63 06/15/2016   Current Outpatient Medications on File Prior to Visit  Medication Sig  . Cholecalciferol (VITAMIN D-3) 5000 units TABS Take  5,000 Units by mouth 2 (two) times daily.  Marland Kitchen doxycycline (VIBRAMYCIN) 100 MG capsule TAKE 1 CAPSULE (100 MG TOTAL) BY MOUTH 2 (TWO) TIMES DAILY FOR ROSACEA.  . finasteride (PROSCAR) 5 MG tablet TAKE 1 TABLET (5 MG TOTAL) BY MOUTH DAILY.  Marland Kitchen lisinopril (PRINIVIL,ZESTRIL) 20 MG tablet TAKE ONE TABLET BY MOUTH ONE TIME DAILY  . rosuvastatin (CRESTOR) 40 MG tablet TAKE ONE TABLET BY MOUTH ONE TIME DAILY FOR CHOLESTEROL  . tamsulosin (FLOMAX) 0.4 MG CAPS capsule Take 1 capsule (0.4 mg total) by mouth daily.  Marland Kitchen terbinafine (LAMISIL) 250 MG tablet TAKE 1 TABLET BY MOUTH EVERY DAY  . testosterone cypionate (DEPOTESTOSTERONE CYPIONATE) 200 MG/ML injection Inject 2 ml into the muscle every 2 weeks.  . phentermine (ADIPEX-P) 37.5 MG tablet Take 1 tablet (37.5 mg total) by mouth daily before breakfast. (Patient not taking: Reported on 07/29/2017)   No current facility-administered medications on file prior to visit.    No Known Allergies   Past Medical History:  Diagnosis Date  . Hyperlipidemia   . Hypertension   . Hypogonadism male   . Insulin resistance   . Pre-diabetes    Health Maintenance  Topic Date Due  . INFLUENZA VACCINE  11/25/2017  . TETANUS/TDAP  05/09/2024  . HIV Screening  Completed   Immunization History  Administered Date(s) Administered  . PPD Test 05/08/2013, 05/09/2014, 05/27/2015, 06/15/2016  . Pneumococcal Polysaccharide-23 01/08/2010, 05/09/2014  . Td 10/20/2004  . Tdap 05/09/2014  .  Zoster 06/05/2015   Past Surgical History:  Procedure Laterality Date  . SPLENECTOMY     Family History  Problem Relation Age of Onset  . Diabetes Mother   . Hypertension Mother   . Heart disease Mother   . Hypertension Father   . Cancer Father    Social History   Occupational History  . Development worker, community, Systems developer.   Tobacco Use  . Smoking status: Never Smoker  . Smokeless tobacco: Never Used  Substance and Sexual Activity  . Alcohol use: Yes  . Drug use: No  .  Sexual activity: Not on file    ROS Constitutional: Denies fever, chills, weight loss/gain, headaches, insomnia,  night sweats or change in appetite. Does c/o fatigue. Eyes: Denies redness, blurred vision, diplopia, discharge, itchy or watery eyes.  ENT: Denies discharge, congestion, post nasal drip, epistaxis, sore throat, earache, hearing loss, dental pain, Tinnitus, Vertigo, Sinus pain or snoring.  Cardio: Denies chest pain, palpitations, irregular heartbeat, syncope, dyspnea, diaphoresis, orthopnea, PND, claudication or edema Respiratory: denies cough, dyspnea, DOE, pleurisy, hoarseness, laryngitis or wheezing.  Gastrointestinal: Denies dysphagia, heartburn, reflux, water brash, pain, cramps, nausea, vomiting, bloating, diarrhea, constipation, hematemesis, melena, hematochezia, jaundice or hemorrhoids Genitourinary: Denies dysuria, frequency, urgency, nocturia, hesitancy, discharge, hematuria or flank pain Musculoskeletal: Denies arthralgia, myalgia, stiffness, Jt. Swelling, pain, limp or strain/sprain. Denies Falls. Skin: Denies puritis, rash, hives, warts, acne, eczema or change in skin lesion Neuro: No weakness, tremor, incoordination, spasms, paresthesia or pain Psychiatric: Denies confusion, memory loss or sensory loss. Denies Depression. Endocrine: Denies change in weight, skin, hair change, nocturia, and paresthesia, diabetic polys, visual blurring or hyper / hypo glycemic episodes.  Heme/Lymph: No excessive bleeding, bruising or enlarged lymph nodes.  Physical Exam  BP 108/78   Pulse 88   Temp 97.8 F (36.6 C)   Resp 18   Ht 5' 9.5" (1.765 m)   Wt 242 lb 12.8 oz (110.1 kg)   BMI 35.34 kg/m   General Appearance: Over nourished and well groomed and in no apparent distress.  Eyes: PERRLA, EOMs, conjunctiva no swelling or erythema, normal fundi and vessels. Sinuses: No frontal/maxillary tenderness ENT/Mouth: EACs patent / TMs  nl. Nares clear without erythema, swelling,  mucoid exudates. Oral hygiene is good. No erythema, swelling, or exudate. Tongue normal, non-obstructing. Tonsils not swollen or erythematous. Hearing normal.  Neck: Supple, thyroid not palpable. No bruits, nodes or JVD. Respiratory: Respiratory effort normal.  BS equal and clear bilateral without rales, rhonci, wheezing or stridor. Cardio: Heart sounds are normal with regular rate and rhythm and no murmurs, rubs or gallops. Peripheral pulses are normal and equal bilaterally without edema. No aortic or femoral bruits. Chest: symmetric with normal excursions and percussion.  Abdomen: Soft, with Nl bowel sounds. Nontender, no guarding, rebound, hernias, masses, or organomegaly.  Lymphatics: Non tender without lymphadenopathy.  Genitourinary:  DRE - declined  Musculoskeletal: Full ROM all peripheral extremities, joint stability, 5/5 strength, and normal gait. Skin: Warm and dry without rashes, lesions, cyanosis, clubbing or  ecchymosis.  Neuro: Cranial nerves intact, reflexes equal bilaterally. Normal muscle tone, no cerebellar symptoms. Sensation intact.  Pysch: Alert and oriented x 3 with normal affect, insight and judgment appropriate.   Assessment and Plan  1. Annual Preventative/Screening Exam   2. Essential hypertension  - EKG 12-Lead - Urinalysis, Routine w reflex microscopic - Microalbumin / creatinine urine ratio - CBC with Differential/Platelet - BASIC METABOLIC PANEL WITH GFR - Magnesium - TSH  3. Hyperlipidemia, mixed  -  EKG 12-Lead - Hepatic function panel - Lipid panel - TSH  4. Insulin resistance  - EKG 12-Lead - Hemoglobin A1c - Insulin, random  5. Vitamin D deficiency  - VITAMIN D 25 Hydroxyl  6. Testosterone deficiency  - Testosterone  7. Screening for colorectal cancer  - POC Hemoccult Bld/Stl  8. Prostate cancer screening  - PSA  9. Screening for ischemic heart disease  - EKG 12-Lead  10. FHx: heart disease   11. Screening examination  for pulmonary tuberculosis  - PPD  12. Fatigue  - Iron,Total/Total Iron Binding Cap - Vitamin B12 - Testosterone - TSH  13. Medication management  - Urinalysis, Routine w reflex microscopic - Microalbumin / creatinine urine ratio - Testosterone - CBC with Differential/Platelet - BASIC METABOLIC PANEL WITH GFR - Hepatic function panel - Magnesium - Lipid panel - TSH - Hemoglobin A1c - Insulin, random - VITAMIN D 25 Hydroxl  14. Need for shingles vaccine  - Varicella zoster antibody, IgG  15. Need for prophylactic vaccination against Streptococcus pneumoniae (pneumococcus)  - Pneumococcal conjugate vaccine 13-valent            Patient was counseled in prudent diet, weight control to achieve/maintain BMI less than 25, BP monitoring, regular exercise and medications as discussed.  Discussed med effects and SE's. Routine screening labs and tests as requested with regular follow-up as recommended. Over 40 minutes of exam, counseling, chart review and high complex critical decision making was performed

## 2017-07-30 ENCOUNTER — Encounter: Payer: Self-pay | Admitting: Internal Medicine

## 2017-07-30 DIAGNOSIS — Z9081 Acquired absence of spleen: Secondary | ICD-10-CM | POA: Insufficient documentation

## 2017-07-30 LAB — CBC WITH DIFFERENTIAL/PLATELET
Basophils Absolute: 124 cells/uL (ref 0–200)
Basophils Relative: 0.9 %
Eosinophils Absolute: 414 cells/uL (ref 15–500)
Eosinophils Relative: 3 %
HCT: 45.9 % (ref 38.5–50.0)
Hemoglobin: 16.6 g/dL (ref 13.2–17.1)
Lymphs Abs: 2995 cells/uL (ref 850–3900)
MCH: 32.6 pg (ref 27.0–33.0)
MCHC: 36.2 g/dL — ABNORMAL HIGH (ref 32.0–36.0)
MCV: 90.2 fL (ref 80.0–100.0)
MPV: 9 fL (ref 7.5–12.5)
Monocytes Relative: 7.6 %
Neutro Abs: 9218 cells/uL — ABNORMAL HIGH (ref 1500–7800)
Neutrophils Relative %: 66.8 %
Platelets: 527 10*3/uL — ABNORMAL HIGH (ref 140–400)
RBC: 5.09 10*6/uL (ref 4.20–5.80)
RDW: 12.6 % (ref 11.0–15.0)
Total Lymphocyte: 21.7 %
WBC mixed population: 1049 cells/uL — ABNORMAL HIGH (ref 200–950)
WBC: 13.8 10*3/uL — ABNORMAL HIGH (ref 3.8–10.8)

## 2017-07-30 LAB — BASIC METABOLIC PANEL WITH GFR
BUN: 10 mg/dL (ref 7–25)
CO2: 28 mmol/L (ref 20–32)
Calcium: 9.8 mg/dL (ref 8.6–10.3)
Chloride: 101 mmol/L (ref 98–110)
Creat: 0.91 mg/dL (ref 0.60–1.35)
GFR, Est African American: 117 mL/min/{1.73_m2} (ref >=60)
GFR, Est Non African American: 101 mL/min/{1.73_m2} (ref >=60)
Glucose, Bld: 189 mg/dL — ABNORMAL HIGH (ref 65–99)
Potassium: 4.5 mmol/L (ref 3.5–5.3)
Sodium: 136 mmol/L (ref 135–146)

## 2017-07-30 LAB — TESTOSTERONE: Testosterone: 493 ng/dL (ref 250–827)

## 2017-07-30 LAB — URINALYSIS, ROUTINE W REFLEX MICROSCOPIC
Bilirubin Urine: NEGATIVE
Hgb urine dipstick: NEGATIVE
Ketones, ur: NEGATIVE
Leukocytes, UA: NEGATIVE
Nitrite: NEGATIVE
Protein, ur: NEGATIVE
Specific Gravity, Urine: 1.031 (ref 1.001–1.03)
pH: 5.5 (ref 5.0–8.0)

## 2017-07-30 LAB — HEMOGLOBIN A1C
Hgb A1c MFr Bld: 6.6 % of total Hgb — ABNORMAL HIGH (ref <5.7)
Mean Plasma Glucose: 143 (calc)
eAG (mmol/L): 7.9 (calc)

## 2017-07-30 LAB — HEPATIC FUNCTION PANEL
AG Ratio: 1.6 (calc) (ref 1.0–2.5)
ALT: 28 U/L (ref 9–46)
AST: 22 U/L (ref 10–40)
Albumin: 4.3 g/dL (ref 3.6–5.1)
Alkaline phosphatase (APISO): 59 U/L (ref 40–115)
Bilirubin, Direct: 0.1 mg/dL (ref 0.0–0.2)
Globulin: 2.7 g/dL (calc) (ref 1.9–3.7)
Indirect Bilirubin: 0.6 mg/dL (calc) (ref 0.2–1.2)
Total Bilirubin: 0.7 mg/dL (ref 0.2–1.2)
Total Protein: 7 g/dL (ref 6.1–8.1)

## 2017-07-30 LAB — INSULIN, RANDOM: Insulin: 13.8 u[IU]/mL (ref 2.0–19.6)

## 2017-07-30 LAB — PSA: PSA: 0.3 ng/mL (ref <=4.0)

## 2017-07-30 LAB — IRON, TOTAL/TOTAL IRON BINDING CAP
%SAT: 34 % (calc) (ref 15–60)
Iron: 107 ug/dL (ref 50–180)
TIBC: 319 mcg/dL (calc) (ref 250–425)

## 2017-07-30 LAB — MICROALBUMIN / CREATININE URINE RATIO
Creatinine, Urine: 97 mg/dL (ref 20–320)
Microalb Creat Ratio: 3 mcg/mg creat (ref <30)
Microalb, Ur: 0.3 mg/dL

## 2017-07-30 LAB — VITAMIN B12: Vitamin B-12: 708 pg/mL (ref 200–1100)

## 2017-07-30 LAB — LIPID PANEL
Cholesterol: 149 mg/dL (ref <200)
HDL: 39 mg/dL — ABNORMAL LOW (ref >40)
LDL Cholesterol (Calc): 76 mg/dL (calc)
Non-HDL Cholesterol (Calc): 110 mg/dL (calc) (ref <130)
Total CHOL/HDL Ratio: 3.8 (calc) (ref <5.0)
Triglycerides: 243 mg/dL — ABNORMAL HIGH (ref <150)

## 2017-07-30 LAB — TSH: TSH: 1.75 mIU/L (ref 0.40–4.50)

## 2017-07-30 LAB — VITAMIN D 25 HYDROXY (VIT D DEFICIENCY, FRACTURES): Vit D, 25-Hydroxy: 54 ng/mL (ref 30–100)

## 2017-07-30 LAB — MAGNESIUM: Magnesium: 1.6 mg/dL (ref 1.5–2.5)

## 2017-07-30 LAB — VARICELLA ZOSTER ANTIBODY, IGG: Varicella IgG: 3161 index

## 2017-08-02 LAB — TB SKIN TEST
Induration: 0 mm
TB Skin Test: NEGATIVE

## 2017-08-24 ENCOUNTER — Ambulatory Visit (INDEPENDENT_AMBULATORY_CARE_PROVIDER_SITE_OTHER): Payer: BLUE CROSS/BLUE SHIELD

## 2017-08-24 DIAGNOSIS — E349 Endocrine disorder, unspecified: Secondary | ICD-10-CM

## 2017-08-24 MED ORDER — TESTOSTERONE CYPIONATE 200 MG/ML IM SOLN
200.0000 mg | Freq: Once | INTRAMUSCULAR | Status: AC
  Administered 2017-08-24: 200 mg via INTRAMUSCULAR

## 2017-08-24 NOTE — Progress Notes (Signed)
Patient here for a NV for his Testosterone Cypionate injection 200 mg/ml IM inRIGHTupper outer quadrant. Patient tolerated welland will return in two weeks for his next injection 

## 2017-09-06 ENCOUNTER — Other Ambulatory Visit: Payer: Self-pay | Admitting: Internal Medicine

## 2017-09-08 ENCOUNTER — Ambulatory Visit (INDEPENDENT_AMBULATORY_CARE_PROVIDER_SITE_OTHER): Payer: BLUE CROSS/BLUE SHIELD

## 2017-09-08 VITALS — Ht 69.5 in | Wt 244.0 lb

## 2017-09-08 DIAGNOSIS — E349 Endocrine disorder, unspecified: Secondary | ICD-10-CM

## 2017-09-08 MED ORDER — TESTOSTERONE CYPIONATE 200 MG/ML IM SOLN
200.0000 mg | INTRAMUSCULAR | Status: DC
  Administered 2017-09-08: 200 mg via INTRAMUSCULAR

## 2017-09-08 NOTE — Progress Notes (Signed)
Patient here for a NV for his Testosterone Cypionate injection 200 mg/ml IM inLEFTupper outer quadrant. Patient tolerated welland will return in two weeks for his next injection 

## 2017-09-12 ENCOUNTER — Other Ambulatory Visit: Payer: Self-pay | Admitting: Internal Medicine

## 2017-09-17 ENCOUNTER — Other Ambulatory Visit: Payer: Self-pay | Admitting: Internal Medicine

## 2017-09-22 ENCOUNTER — Ambulatory Visit (INDEPENDENT_AMBULATORY_CARE_PROVIDER_SITE_OTHER): Payer: BLUE CROSS/BLUE SHIELD

## 2017-09-22 DIAGNOSIS — E349 Endocrine disorder, unspecified: Secondary | ICD-10-CM | POA: Diagnosis not present

## 2017-09-22 MED ORDER — TESTOSTERONE CYPIONATE 200 MG/ML IM SOLN
200.0000 mg | Freq: Once | INTRAMUSCULAR | Status: AC
  Administered 2017-09-22: 200 mg via INTRAMUSCULAR

## 2017-09-22 NOTE — Progress Notes (Signed)
Patient here for a NV for his Testosterone Cypionate injection 200 mg/ml IM inRIGHTupper outer quadrant. Patient tolerated welland will return in two weeks for his next injection 

## 2017-10-04 ENCOUNTER — Other Ambulatory Visit: Payer: Self-pay | Admitting: Internal Medicine

## 2017-10-06 ENCOUNTER — Ambulatory Visit (INDEPENDENT_AMBULATORY_CARE_PROVIDER_SITE_OTHER): Payer: Self-pay

## 2017-10-06 DIAGNOSIS — E349 Endocrine disorder, unspecified: Secondary | ICD-10-CM

## 2017-10-06 MED ORDER — TESTOSTERONE CYPIONATE 200 MG/ML IM SOLN
200.0000 mg | INTRAMUSCULAR | Status: DC
  Administered 2017-10-06: 200 mg via INTRAMUSCULAR

## 2017-10-06 NOTE — Progress Notes (Signed)
Patient here for a NV for his Testosterone Cypionate injection 200 mg/ml IM inRIGHTupper outer quadrant. Patient tolerated welland will return in two weeks for his next injection 

## 2017-10-20 ENCOUNTER — Ambulatory Visit: Payer: Self-pay

## 2017-10-20 ENCOUNTER — Ambulatory Visit (INDEPENDENT_AMBULATORY_CARE_PROVIDER_SITE_OTHER): Payer: Self-pay

## 2017-10-20 DIAGNOSIS — E349 Endocrine disorder, unspecified: Secondary | ICD-10-CM

## 2017-10-20 MED ORDER — TESTOSTERONE CYPIONATE 200 MG/ML IM SOLN
200.0000 mg | Freq: Once | INTRAMUSCULAR | Status: AC
  Administered 2017-10-20: 200 mg via INTRAMUSCULAR

## 2017-10-20 NOTE — Progress Notes (Signed)
Patient here for a NV for his Testosterone Cypionate injection 200 mg/ml IM inLEFTupper outer quadrant. Patient tolerated welland will return in two weeks for his next injection 

## 2017-11-03 ENCOUNTER — Ambulatory Visit (INDEPENDENT_AMBULATORY_CARE_PROVIDER_SITE_OTHER): Payer: Self-pay

## 2017-11-03 DIAGNOSIS — E349 Endocrine disorder, unspecified: Secondary | ICD-10-CM

## 2017-11-03 NOTE — Progress Notes (Signed)
Patient here for a NV for his Testosterone Cypionate injection 200 mg/ml IM inRIGHTupper outer quadrant. Patient tolerated welland will return in two weeks for his next injection 

## 2017-11-17 ENCOUNTER — Ambulatory Visit (INDEPENDENT_AMBULATORY_CARE_PROVIDER_SITE_OTHER): Payer: Self-pay

## 2017-11-17 DIAGNOSIS — E349 Endocrine disorder, unspecified: Secondary | ICD-10-CM

## 2017-11-17 MED ORDER — TESTOSTERONE CYPIONATE 200 MG/ML IM SOLN
200.0000 mg | Freq: Once | INTRAMUSCULAR | Status: AC
  Administered 2017-11-17: 200 mg via INTRAMUSCULAR

## 2017-11-17 NOTE — Progress Notes (Signed)
Patient here for a NV for his Testosterone Cypionate injection 200 mg/ml IM inLEFTupper outer quadrant. Patient tolerated welland will return in two weeks for his next injection 

## 2017-11-23 ENCOUNTER — Ambulatory Visit: Payer: Self-pay | Admitting: Adult Health

## 2017-11-29 DIAGNOSIS — E119 Type 2 diabetes mellitus without complications: Secondary | ICD-10-CM | POA: Insufficient documentation

## 2017-11-29 DIAGNOSIS — N182 Chronic kidney disease, stage 2 (mild): Secondary | ICD-10-CM | POA: Insufficient documentation

## 2017-11-29 DIAGNOSIS — E1122 Type 2 diabetes mellitus with diabetic chronic kidney disease: Secondary | ICD-10-CM | POA: Insufficient documentation

## 2017-11-29 NOTE — Progress Notes (Signed)
FOLLOW UP  Assessment and Plan:   Hypertension Well controlled with current medications  Monitor blood pressure at home; patient to call if consistently greater than 130/80 Continue DASH diet.   Reminder to go to the ER if any CP, SOB, nausea, dizziness, severe HA, changes vision/speech, left arm numbness and tingling and jaw pain.  Cholesterol Currently at goal; continue statin, diet for trigs discussed Continue low cholesterol diet and exercise.  Check lipid panel.   Prediabetes Borderline diebetic - discussed metformin, will start low dose if A1C remains elevated at this check Continue diet and exercise.  Perform daily foot/skin check, notify office of any concerning changes.  Check A1C  Obesity with co morbidities Long discussion about weight loss, diet, and exercise Recommended diet heavy in fruits and veggies and low in animal meats, cheeses, and dairy products, appropriate calorie intake Discussed ideal weight for height  Patient will work on cutting out soda Patient will start on phentermine - follow up 3 months Will follow up in 3 months  Vitamin D Def At goal at last visit;  Defer Vit D level  Hypogonadism - continue replacement therapy, check testosterone levels as needed.    Continue diet and meds as discussed. Further disposition pending results of labs. Discussed med's effects and SE's.   Over 30 minutes of exam, counseling, chart review, and critical decision making was performed.   Future Appointments  Date Time Provider Department Center  12/15/2017  4:30 PM [facility] NURSE [facility] None  08/18/2018  2:00 PM Lucky CowboyMcKeown, William, MD [facility] None    ----------------------------------------------------------------------------------------------------------------------  HPI 47 y.o. male  presents for 3 month follow up on hypertension, cholesterol, prediabetes, morbid obesity, hypogonadism and vitamin D deficiency.   he is prescribed phentermine for  weight loss but he hasn't started taking.  They deny palpitations, anxiety, trouble sleeping, elevated BP.   BMI is Body mass index is 34.93 kg/m., he has not been working on diet and exercise. Wt Readings from Last 3 Encounters:  11/30/17 240 lb (108.9 kg)  09/08/17 244 lb (110.7 kg)  07/29/17 242 lb 12.8 oz (110.1 kg)   His blood pressure has been controlled at home, today their BP is BP: 116/82  He does not workout. He denies chest pain, shortness of breath, dizziness.   He is on cholesterol medication (rosuvastatin 20 mg daily) and denies myalgias. His cholesterol is at goal. The cholesterol last visit was:   Lab Results  Component Value Date   CHOL 149 07/29/2017   HDL 39 (L) 07/29/2017   LDLCALC 76 07/29/2017   TRIG 243 (H) 07/29/2017   CHOLHDL 3.8 07/29/2017    He has not been working on diet and exercise for prediabetes, and denies foot ulcerations, increased appetite, nausea, paresthesia of the feet, polydipsia, polyuria, visual disturbances, vomiting and weight loss. Last A1C in the office was:  Lab Results  Component Value Date   HGBA1C 6.6 (H) 07/29/2017   Patient is on Vitamin D supplement.   Lab Results  Component Value Date   VD25OH 4654 07/29/2017     He has a history of testosterone deficiency and is on testosterone replacement. He states that the testosterone helps with his energy, libido, muscle mass. Lab Results  Component Value Date   TESTOSTERONE 493 07/29/2017      Current Medications:  Current Outpatient Medications on File Prior to Visit  Medication Sig  . Cholecalciferol (VITAMIN D-3) 5000 units TABS Take 5,000 Units by mouth 2 (two) times daily.  .Marland Kitchen  doxycycline (VIBRAMYCIN) 100 MG capsule TAKE 1 CAPSULE (100 MG TOTAL) BY MOUTH 2 (TWO) TIMES DAILY FOR ROSACEA.  . finasteride (PROSCAR) 5 MG tablet TAKE 1 TABLET (5 MG TOTAL) BY MOUTH DAILY.  Marland Kitchen lisinopril (PRINIVIL,ZESTRIL) 20 MG tablet TAKE ONE TABLET BY MOUTH ONE TIME DAILY  . rosuvastatin (CRESTOR)  40 MG tablet TAKE ONE TABLET BY MOUTH ONE TIME DAILY FOR CHOLESTEROL  . terbinafine (LAMISIL) 250 MG tablet TAKE 1 TABLET BY MOUTH EVERY DAY  . testosterone cypionate (DEPOTESTOSTERONE CYPIONATE) 200 MG/ML injection INJECT 2 MLS INTRAMUSCULARLY EVERY 2 WEEKS  . phentermine (ADIPEX-P) 37.5 MG tablet Take 1 tablet (37.5 mg total) by mouth daily before breakfast. (Patient not taking: Reported on 11/30/2017)  . tamsulosin (FLOMAX) 0.4 MG CAPS capsule Take 1 capsule (0.4 mg total) by mouth daily. (Patient not taking: Reported on 11/30/2017)   Current Facility-Administered Medications on File Prior to Visit  Medication  . testosterone cypionate (DEPOTESTOSTERONE CYPIONATE) injection 200 mg     Allergies: No Known Allergies   Medical History:  Past Medical History:  Diagnosis Date  . Hyperlipidemia   . Hypertension   . Hypogonadism male   . Insulin resistance   . Pre-diabetes    Family history- Reviewed and unchanged Social history- Reviewed and unchanged   Review of Systems:  Review of Systems  Constitutional: Negative for malaise/fatigue and weight loss.  HENT: Negative for hearing loss and tinnitus.   Eyes: Negative for blurred vision and double vision.  Respiratory: Negative for cough, shortness of breath and wheezing.   Cardiovascular: Negative for chest pain, palpitations, orthopnea, claudication and leg swelling.  Gastrointestinal: Negative for abdominal pain, blood in stool, constipation, diarrhea, heartburn, melena, nausea and vomiting.  Genitourinary: Negative.   Musculoskeletal: Negative for joint pain and myalgias.  Skin: Negative for rash.  Neurological: Negative for dizziness, tingling, sensory change, weakness and headaches.  Endo/Heme/Allergies: Negative for polydipsia.  Psychiatric/Behavioral: Negative.   All other systems reviewed and are negative.   Physical Exam: BP 116/82   Pulse 95   Temp 97.7 F (36.5 C)   Ht 5' 9.5" (1.765 m)   Wt 240 lb (108.9 kg)    SpO2 97%   BMI 34.93 kg/m  Wt Readings from Last 3 Encounters:  11/30/17 240 lb (108.9 kg)  09/08/17 244 lb (110.7 kg)  07/29/17 242 lb 12.8 oz (110.1 kg)   General Appearance: Well nourished, in no apparent distress. Eyes: PERRLA, EOMs, conjunctiva no swelling or erythema Sinuses: No Frontal/maxillary tenderness ENT/Mouth: Ext aud canals clear, TMs without erythema, bulging. No erythema, swelling, or exudate on post pharynx.  Tonsils not swollen or erythematous. Hearing normal.  Neck: Supple, thyroid normal.  Respiratory: Respiratory effort normal, BS equal bilaterally without rales, rhonchi, wheezing or stridor.  Cardio: RRR with no MRGs. Brisk peripheral pulses without edema.  Abdomen: Soft, + BS.  Non tender, no guarding, rebound, hernias, masses. Lymphatics: Non tender without lymphadenopathy.  Musculoskeletal: Full ROM, 5/5 strength, Normal gait Skin: Warm, dry without rashes, lesions, ecchymosis.  Neuro: Cranial nerves intact. No cerebellar symptoms.  Psych: Awake and oriented X 3, normal affect, Insight and Judgment appropriate.    Dan Maker, NP 2:42 PM [facility]

## 2017-11-30 ENCOUNTER — Ambulatory Visit (INDEPENDENT_AMBULATORY_CARE_PROVIDER_SITE_OTHER): Payer: 59 | Admitting: Adult Health

## 2017-11-30 ENCOUNTER — Encounter: Payer: Self-pay | Admitting: Adult Health

## 2017-11-30 VITALS — BP 116/82 | HR 95 | Temp 97.7°F | Ht 69.5 in | Wt 240.0 lb

## 2017-11-30 DIAGNOSIS — E559 Vitamin D deficiency, unspecified: Secondary | ICD-10-CM | POA: Diagnosis not present

## 2017-11-30 DIAGNOSIS — R7303 Prediabetes: Secondary | ICD-10-CM

## 2017-11-30 DIAGNOSIS — E782 Mixed hyperlipidemia: Secondary | ICD-10-CM

## 2017-11-30 DIAGNOSIS — E8881 Metabolic syndrome: Secondary | ICD-10-CM | POA: Diagnosis not present

## 2017-11-30 DIAGNOSIS — Z79899 Other long term (current) drug therapy: Secondary | ICD-10-CM

## 2017-11-30 DIAGNOSIS — E349 Endocrine disorder, unspecified: Secondary | ICD-10-CM | POA: Diagnosis not present

## 2017-11-30 DIAGNOSIS — I10 Essential (primary) hypertension: Secondary | ICD-10-CM

## 2017-11-30 MED ORDER — TESTOSTERONE CYPIONATE 200 MG/ML IM SOLN
200.0000 mg | Freq: Once | INTRAMUSCULAR | Status: AC
  Administered 2017-11-30: 200 mg via INTRAMUSCULAR

## 2017-11-30 NOTE — Patient Instructions (Addendum)
Recommend cutting down on sugar intake (soda)  - the American Heart Association recommends no more than 9 teaspoons (38 g) of added sugar for men daily, and 6 teaspoons (25 g) for women. Added sugar can be in many things that you might not expect - salad dressings, bread that is not home made, "all natural" fruit juice, etc., most processed foods contain hidden sugars. Consider looking at labels and being aware of how much sugar you are consuming in a day. Less is always better for sugar; sugar reduces your body's immune response, and damages blood vessels, leading to increased risk of many diseases.   Diabetes is a very complicated disease...lets simplify it.  An easy way to look at it to understand the complications is if you think of the extra sugar floating in your blood stream as glass shards floating through your blood stream.   Diabetes affects your small vessels first: 1) The glass shards (sugar) scraps down the tiny blood vessels in your eyes and lead to diabetic retinopathy, the leading cause of blindness in the [place]. Diabetes is the leading cause of newly diagnosed adult (22 to 47 years of age) blindness in the [place].  2) The glass shards scratches down the tiny vessels of your legs leading to nerve damage called neuropathy and can lead to amputations of your feet. More than 60% of all non-traumatic amputations of lower limbs occur in people with diabetes.  3) Over time the small vessels in your brain are shredded and closed off, individually this does not cause any problems but over a long period of time many of the small vessels being blocked can lead to Vascular Dementia.   4) Your kidney's are a filter system and have a "net" that keeps certain things in the body and lets bad things out. Sugar shreds this net and leads to kidney damage and eventually failure. Decreasing the sugar that is destroying the net and certain blood pressure medications can help stop or decrease progression  of kidney disease. Diabetes was the primary cause of kidney failure in 44 percent of all new cases in 2011.  5) Diabetes also destroys the small vessels in your penis that lead to erectile dysfunction. Eventually the vessels are so damaged that you may not be responsive to cialis or viagra.   Diabetes and your large vessels: Your larger vessels consist of your coronary arteries in your heart and the carotid vessels to your brain. Diabetes or even increased sugars put you at 300% increased risk of heart attack and stroke and this is why.. The sugar scrapes down your large blood vessels and your body sees this as an internal injury and tries to repair itself. Just like you get a scab on your skin, your platelets will stick to the blood vessel wall trying to heal it. This is why we have diabetics on low dose aspirin daily, this prevents the platelets from sticking and can prevent plaque formation. In addition, your body takes cholesterol and tries to shove it into the open wound. This is why we want your LDL, or bad cholesterol, below 70.   The combination of platelets and cholesterol over 5-10 years forms plaque that can break off and cause a heart attack or stroke.   PLEASE REMEMBER:  Diabetes is preventable! Up to 85 percent of complications and morbidities among individuals with type 2 diabetes can be prevented, delayed, or effectively treated and minimized with regular visits to a health professional, appropriate monitoring and medication,  and a healthy diet and lifestyle.        Know what a healthy weight is for you (roughly BMI <25) and aim to maintain this  Aim for 7+ servings of fruits and vegetables daily  65-80+ fluid ounces of water or unsweet tea for healthy kidneys  Limit to max 1 drink of alcohol per day; avoid smoking/tobacco  Limit animal fats in diet for cholesterol and heart health - choose grass fed whenever available  Avoid highly processed foods, and foods high in  saturated/trans fats  Aim for low stress - take time to unwind and care for your mental health  Aim for 150 min of moderate intensity exercise weekly for heart health, and weights twice weekly for bone health  Aim for 7-9 hours of sleep daily       When it comes to diets, agreement about the perfect plan isn't easy to find, even among the experts. Experts at the [employer] developed an idea known as the Healthy Eating Plate. Just imagine a plate divided into logical, healthy portions.  The emphasis is on diet quality:  Load up on vegetables and fruits - one-half of your plate: Aim for color and variety, and remember that potatoes don't count.  Go for whole grains - one-quarter of your plate: Whole wheat, barley, wheat berries, quinoa, oats, brown rice, and foods made with them. If you want pasta, go with whole wheat pasta.  Protein power - one-quarter of your plate: Fish, chicken, beans, and nuts are all healthy, versatile protein sources. Limit red meat.  The diet, however, does go beyond the plate, offering a few other suggestions.  Use healthy plant oils, such as olive, canola, soy, corn, sunflower and peanut. Check the labels, and avoid partially hydrogenated oil, which have unhealthy trans fats.  If you're thirsty, drink water. Coffee and tea are good in moderation, but skip sugary drinks and limit milk and dairy products to one or two daily servings.  The type of carbohydrate in the diet is more important than the amount. Some sources of carbohydrates, such as vegetables, fruits, whole grains, and beans-are healthier than others.  Finally, stay active.

## 2017-11-30 NOTE — Progress Notes (Signed)
Patient was given his Testosterone Cypionate injection 200 mg/ml IM in RIGHTupper outer quadrant. Patient tolerated welland will return in two weeks for his next injection

## 2017-12-01 ENCOUNTER — Other Ambulatory Visit: Payer: Self-pay | Admitting: Adult Health

## 2017-12-01 ENCOUNTER — Encounter: Payer: Self-pay | Admitting: Adult Health

## 2017-12-01 ENCOUNTER — Other Ambulatory Visit: Payer: Self-pay

## 2017-12-01 DIAGNOSIS — D580 Hereditary spherocytosis: Secondary | ICD-10-CM | POA: Insufficient documentation

## 2017-12-01 LAB — CBC WITH DIFFERENTIAL/PLATELET
Basophils Absolute: 135 cells/uL (ref 0–200)
Basophils Relative: 1 %
Eosinophils Absolute: 270 cells/uL (ref 15–500)
Eosinophils Relative: 2 %
HCT: 51.1 % — ABNORMAL HIGH (ref 38.5–50.0)
Hemoglobin: 18.5 g/dL — ABNORMAL HIGH (ref 13.2–17.1)
Lymphs Abs: 3065 cells/uL (ref 850–3900)
MCH: 32.4 pg (ref 27.0–33.0)
MCHC: 36.2 g/dL — ABNORMAL HIGH (ref 32.0–36.0)
MCV: 89.5 fL (ref 80.0–100.0)
MPV: 9 fL (ref 7.5–12.5)
Monocytes Relative: 8.5 %
Neutro Abs: 8883 cells/uL — ABNORMAL HIGH (ref 1500–7800)
Neutrophils Relative %: 65.8 %
Platelets: 584 10*3/uL — ABNORMAL HIGH (ref 140–400)
RBC: 5.71 10*6/uL (ref 4.20–5.80)
RDW: 12.1 % (ref 11.0–15.0)
Total Lymphocyte: 22.7 %
WBC mixed population: 1148 cells/uL — ABNORMAL HIGH (ref 200–950)
WBC: 13.5 10*3/uL — ABNORMAL HIGH (ref 3.8–10.8)

## 2017-12-01 LAB — LIPID PANEL
Cholesterol: 170 mg/dL (ref <200)
HDL: 42 mg/dL (ref >40)
LDL Cholesterol (Calc): 101 mg/dL (calc) — ABNORMAL HIGH
Non-HDL Cholesterol (Calc): 128 mg/dL (calc) (ref <130)
Total CHOL/HDL Ratio: 4 (calc) (ref <5.0)
Triglycerides: 177 mg/dL — ABNORMAL HIGH (ref <150)

## 2017-12-01 LAB — COMPLETE METABOLIC PANEL WITH GFR
AG Ratio: 1.7 (calc) (ref 1.0–2.5)
ALT: 23 U/L (ref 9–46)
AST: 23 U/L (ref 10–40)
Albumin: 4.6 g/dL (ref 3.6–5.1)
Alkaline phosphatase (APISO): 61 U/L (ref 40–115)
BUN: 8 mg/dL (ref 7–25)
CO2: 27 mmol/L (ref 20–32)
Calcium: 10 mg/dL (ref 8.6–10.3)
Chloride: 100 mmol/L (ref 98–110)
Creat: 1.02 mg/dL (ref 0.60–1.35)
GFR, Est African American: 101 mL/min/{1.73_m2} (ref >=60)
GFR, Est Non African American: 87 mL/min/{1.73_m2} (ref >=60)
Globulin: 2.7 g/dL (calc) (ref 1.9–3.7)
Glucose, Bld: 154 mg/dL — ABNORMAL HIGH (ref 65–99)
Potassium: 4.9 mmol/L (ref 3.5–5.3)
Sodium: 138 mmol/L (ref 135–146)
Total Bilirubin: 0.8 mg/dL (ref 0.2–1.2)
Total Protein: 7.3 g/dL (ref 6.1–8.1)

## 2017-12-01 LAB — HEMOGLOBIN A1C
Hgb A1c MFr Bld: 7.4 % of total Hgb — ABNORMAL HIGH (ref <5.7)
Mean Plasma Glucose: 166 (calc)
eAG (mmol/L): 9.2 (calc)

## 2017-12-01 LAB — TSH: TSH: 1.71 mIU/L (ref 0.40–4.50)

## 2017-12-01 MED ORDER — METFORMIN HCL ER 500 MG PO TB24: ORAL_TABLET | ORAL | 1 refills | Status: DC

## 2017-12-01 MED ORDER — PHENTERMINE HCL 37.5 MG PO TABS: 37.5000 mg | ORAL_TABLET | Freq: Every day | ORAL | 2 refills | Status: DC

## 2017-12-01 NOTE — Telephone Encounter (Signed)
Phentermine refill request. PMP checked, last filled on 06/11/17.

## 2017-12-09 ENCOUNTER — Other Ambulatory Visit: Payer: Self-pay | Admitting: Physician Assistant

## 2017-12-15 ENCOUNTER — Ambulatory Visit (INDEPENDENT_AMBULATORY_CARE_PROVIDER_SITE_OTHER): Payer: 59

## 2017-12-15 DIAGNOSIS — E349 Endocrine disorder, unspecified: Secondary | ICD-10-CM

## 2017-12-15 MED ORDER — TESTOSTERONE CYPIONATE 200 MG/ML IM SOLN
200.0000 mg | INTRAMUSCULAR | Status: DC
  Administered 2017-12-15: 200 mg via INTRAMUSCULAR

## 2017-12-15 NOTE — Progress Notes (Signed)
Patient here for a NV for his Testosterone Cypionate injection 200 mg/ml IM inRIGHTupper outer quadrant. Patient tolerated welland will return in two weeks for his next injection.  Pt also wanted his blood sugar checked today and it was 151 after just eating before coming here today.

## 2017-12-23 ENCOUNTER — Other Ambulatory Visit: Payer: Self-pay | Admitting: Adult Health

## 2017-12-29 ENCOUNTER — Ambulatory Visit (INDEPENDENT_AMBULATORY_CARE_PROVIDER_SITE_OTHER): Payer: 59

## 2017-12-29 VITALS — Ht 69.5 in

## 2017-12-29 DIAGNOSIS — E349 Endocrine disorder, unspecified: Secondary | ICD-10-CM | POA: Diagnosis not present

## 2017-12-29 MED ORDER — TESTOSTERONE CYPIONATE 200 MG/ML IM SOLN
200.0000 mg | INTRAMUSCULAR | Status: DC
  Administered 2017-12-29: 200 mg via INTRAMUSCULAR

## 2017-12-29 NOTE — Progress Notes (Signed)
Patient here for a NV for his Testosterone Cypionate injection 200 mg/ml IM inLEFTupper outer quadrant. Patient tolerated welland will return in two weeks for his next injection 

## 2017-12-30 ENCOUNTER — Telehealth: Payer: Self-pay | Admitting: Physician Assistant

## 2017-12-30 MED ORDER — TADALAFIL 20 MG PO TABS: ORAL_TABLET | ORAL | 0 refills | Status: DC

## 2017-12-30 NOTE — Telephone Encounter (Signed)
-----   Message from Gregery Na, CMA sent at 12/29/2017  4:31 PM EDT ----- Regarding: REFILL PT WOULD LIKE A REFILL FOR [facility].  STATES HE HAS BEEN OFF OF IT FOR AWHILE NOW & WOULD LIKE TO RESTART.   THANKS!

## 2018-01-12 ENCOUNTER — Ambulatory Visit (INDEPENDENT_AMBULATORY_CARE_PROVIDER_SITE_OTHER): Payer: Self-pay

## 2018-01-12 DIAGNOSIS — E349 Endocrine disorder, unspecified: Secondary | ICD-10-CM

## 2018-01-12 MED ORDER — TESTOSTERONE CYPIONATE 200 MG/ML IM SOLN
200.0000 mg | INTRAMUSCULAR | Status: DC
  Administered 2018-01-12: 200 mg via INTRAMUSCULAR

## 2018-01-12 NOTE — Progress Notes (Signed)
Patient here for a NV for his Testosterone Cypionate injection 200 mg/ml IM inRIGHTupper outer quadrant. Patient tolerated welland will return in two weeks for his next injection

## 2018-01-26 ENCOUNTER — Ambulatory Visit (INDEPENDENT_AMBULATORY_CARE_PROVIDER_SITE_OTHER): Payer: Self-pay

## 2018-01-26 DIAGNOSIS — E349 Endocrine disorder, unspecified: Secondary | ICD-10-CM

## 2018-01-26 MED ORDER — TESTOSTERONE CYPIONATE 200 MG/ML IM SOLN
200.0000 mg | Freq: Once | INTRAMUSCULAR | Status: AC
  Administered 2018-01-26: 200 mg via INTRAMUSCULAR

## 2018-01-26 NOTE — Progress Notes (Signed)
Patient here for a NV for his Testosterone Cypionate injection 200 mg/ml IM inLEFTupper outer quadrant. Patient tolerated welland will return in two weeks for his next injection 

## 2018-02-09 ENCOUNTER — Ambulatory Visit (INDEPENDENT_AMBULATORY_CARE_PROVIDER_SITE_OTHER): Payer: Self-pay

## 2018-02-09 DIAGNOSIS — E349 Endocrine disorder, unspecified: Secondary | ICD-10-CM

## 2018-02-09 MED ORDER — TESTOSTERONE CYPIONATE 200 MG/ML IM SOLN
200.0000 mg | Freq: Once | INTRAMUSCULAR | Status: AC
  Administered 2018-02-09: 200 mg via INTRAMUSCULAR

## 2018-02-09 NOTE — Progress Notes (Signed)
Patient here for a NV for his Testosterone Cypionate injection 200 mg/ml IM inRIGHTupper outer quadrant. Patient tolerated welland will return in two weeks for his next injection 

## 2018-02-21 ENCOUNTER — Other Ambulatory Visit: Payer: Self-pay | Admitting: Physician Assistant

## 2018-02-21 ENCOUNTER — Other Ambulatory Visit: Payer: Self-pay | Admitting: Internal Medicine

## 2018-02-23 ENCOUNTER — Ambulatory Visit: Payer: Self-pay

## 2018-02-24 ENCOUNTER — Ambulatory Visit (INDEPENDENT_AMBULATORY_CARE_PROVIDER_SITE_OTHER): Payer: Self-pay

## 2018-02-24 DIAGNOSIS — E349 Endocrine disorder, unspecified: Secondary | ICD-10-CM

## 2018-02-24 MED ORDER — TESTOSTERONE CYPIONATE 200 MG/ML IM SOLN
200.0000 mg | Freq: Once | INTRAMUSCULAR | Status: AC
  Administered 2018-02-24: 200 mg via INTRAMUSCULAR

## 2018-02-24 NOTE — Progress Notes (Signed)
Patient here for a NV for his Testosterone Cypionate injection 200 mg/ml IM inLEFTupper outer quadrant. Patient tolerated welland will return in two weeks for his next injection 

## 2018-03-10 ENCOUNTER — Ambulatory Visit (INDEPENDENT_AMBULATORY_CARE_PROVIDER_SITE_OTHER): Payer: Self-pay

## 2018-03-10 DIAGNOSIS — E349 Endocrine disorder, unspecified: Secondary | ICD-10-CM

## 2018-03-10 MED ORDER — TESTOSTERONE CYPIONATE 200 MG/ML IM SOLN
200.0000 mg | Freq: Once | INTRAMUSCULAR | Status: AC
  Administered 2018-03-10: 200 mg via INTRAMUSCULAR

## 2018-03-10 NOTE — Progress Notes (Signed)
Patient here for a NV for his Testosterone Cypionate injection 200 mg/ml IM inRIGHTupper outer quadrant. Patient tolerated welland will return in two weeks for his next injection 

## 2018-03-23 ENCOUNTER — Ambulatory Visit (INDEPENDENT_AMBULATORY_CARE_PROVIDER_SITE_OTHER): Payer: Self-pay

## 2018-03-23 VITALS — Temp 98.7°F | Ht 69.5 in | Wt 238.0 lb

## 2018-03-23 DIAGNOSIS — E349 Endocrine disorder, unspecified: Secondary | ICD-10-CM

## 2018-03-23 MED ORDER — TESTOSTERONE CYPIONATE 200 MG/ML IM SOLN
200.0000 mg | INTRAMUSCULAR | Status: DC
  Administered 2018-03-23: 200 mg via INTRAMUSCULAR

## 2018-03-23 NOTE — Progress Notes (Signed)
Patient here for a NV for his Testosterone Cypionate injection 200 mg/ml IM inLEFTupper outer quadrant. Patient tolerated welland will return in two weeks for his next injection

## 2018-04-05 ENCOUNTER — Other Ambulatory Visit: Payer: Self-pay | Admitting: Internal Medicine

## 2018-04-05 DIAGNOSIS — E349 Endocrine disorder, unspecified: Secondary | ICD-10-CM

## 2018-04-05 MED ORDER — TESTOSTERONE CYPIONATE 200 MG/ML IM SOLN: INTRAMUSCULAR | 2 refills | Status: DC

## 2018-04-07 ENCOUNTER — Ambulatory Visit: Payer: BLUE CROSS/BLUE SHIELD | Admitting: *Deleted

## 2018-04-07 VITALS — BP 126/84 | HR 100 | Temp 97.7°F | Resp 16 | Ht 69.5 in | Wt 237.4 lb

## 2018-04-07 DIAGNOSIS — E349 Endocrine disorder, unspecified: Secondary | ICD-10-CM | POA: Diagnosis not present

## 2018-04-07 MED ORDER — TESTOSTERONE CYPIONATE 200 MG/ML IM SOLN: 400.0000 mg | Freq: Once | INTRAMUSCULAR | Status: DC

## 2018-04-07 MED ORDER — TESTOSTERONE CYPIONATE 200 MG/ML IM SOLN
400.0000 mg | Freq: Once | INTRAMUSCULAR | Status: AC
  Administered 2018-04-07: 400 mg via INTRAMUSCULAR

## 2018-04-07 NOTE — Patient Instructions (Signed)
Patient is here for a NV for his Testosterone Cypionate injection 200 mg/ml 2 ml IM in the right upper outer quadrant. Patient tolerated well.

## 2018-04-11 ENCOUNTER — Other Ambulatory Visit: Payer: Self-pay | Admitting: Internal Medicine

## 2018-04-11 DIAGNOSIS — E349 Endocrine disorder, unspecified: Secondary | ICD-10-CM

## 2018-04-11 MED ORDER — TESTOSTERONE CYPIONATE 200 MG/ML IM SOLN: INTRAMUSCULAR | 2 refills | Status: DC

## 2018-04-21 ENCOUNTER — Other Ambulatory Visit: Payer: Self-pay | Admitting: Internal Medicine

## 2018-04-21 ENCOUNTER — Other Ambulatory Visit: Payer: Self-pay | Admitting: Physician Assistant

## 2018-04-21 ENCOUNTER — Ambulatory Visit: Payer: BLUE CROSS/BLUE SHIELD | Admitting: *Deleted

## 2018-04-21 VITALS — BP 122/76 | HR 76 | Temp 97.5°F | Wt 233.8 lb

## 2018-04-21 DIAGNOSIS — E349 Endocrine disorder, unspecified: Secondary | ICD-10-CM

## 2018-04-21 MED ORDER — TESTOSTERONE CYPIONATE 200 MG/ML IM SOLN
400.0000 mg | Freq: Once | INTRAMUSCULAR | Status: AC
  Administered 2018-04-21: 400 mg via INTRAMUSCULAR

## 2018-04-21 NOTE — Progress Notes (Signed)
Patient is here for a NV for his Testosterone Cypionate injection 200 mg/ml 2 ml IM in left upper outer quadrant. Patient tolerated well and will return in 2 weeks for the next injection.

## 2018-05-05 ENCOUNTER — Ambulatory Visit: Payer: BLUE CROSS/BLUE SHIELD

## 2018-05-05 VITALS — BP 122/78 | Temp 98.9°F | Ht 69.5 in | Wt 237.8 lb

## 2018-05-05 DIAGNOSIS — E349 Endocrine disorder, unspecified: Secondary | ICD-10-CM

## 2018-05-05 MED ORDER — TESTOSTERONE CYPIONATE 200 MG/ML IM SOLN: 200.0000 mg | INTRAMUSCULAR | Status: DC

## 2018-05-05 NOTE — Progress Notes (Signed)
Patient is here for a NV for his Testosterone Cypionate injection 200 mg/ml 2 ml IM inRIGHTupper outer quadrant. Patient tolerated well and will return in 2 weeks for the next injection 

## 2018-05-16 ENCOUNTER — Other Ambulatory Visit: Payer: Self-pay

## 2018-05-16 MED ORDER — METFORMIN HCL ER 500 MG PO TB24: ORAL_TABLET | ORAL | 1 refills | Status: DC

## 2018-05-19 ENCOUNTER — Ambulatory Visit: Payer: BLUE CROSS/BLUE SHIELD

## 2018-05-19 VITALS — BP 130/82 | HR 100 | Temp 98.0°F | Ht 69.5 in | Wt 237.4 lb

## 2018-05-19 DIAGNOSIS — E349 Endocrine disorder, unspecified: Secondary | ICD-10-CM

## 2018-05-19 MED ORDER — TESTOSTERONE CYPIONATE 200 MG/ML IM SOLN
200.0000 mg | Freq: Once | INTRAMUSCULAR | Status: AC
  Administered 2018-05-19: 200 mg via INTRAMUSCULAR

## 2018-05-19 NOTE — Progress Notes (Signed)
Patient is here for a NV for his Testosterone Cypionate injection 200 mg/ml 2 ml IM inLEFTupper outer quadrant. Patient tolerated well and will return in 2 weeks for the next injection 

## 2018-05-21 ENCOUNTER — Other Ambulatory Visit: Payer: Self-pay | Admitting: Internal Medicine

## 2018-06-01 ENCOUNTER — Ambulatory Visit: Payer: BLUE CROSS/BLUE SHIELD

## 2018-06-01 VITALS — BP 112/78 | HR 97 | Temp 97.5°F | Ht 69.5 in | Wt 230.6 lb

## 2018-06-01 DIAGNOSIS — E349 Endocrine disorder, unspecified: Secondary | ICD-10-CM

## 2018-06-01 MED ORDER — TESTOSTERONE CYPIONATE 200 MG/ML IM SOLN
200.0000 mg | INTRAMUSCULAR | Status: DC
  Administered 2018-06-01: 200 mg via INTRAMUSCULAR

## 2018-06-01 NOTE — Progress Notes (Signed)
Patient is here for a NV for his Testosterone Cypionate injection 200 mg/ml 2 ml IM inLEFTupper outer quadrant. Patient tolerated well and will return in 2 weeks for the next injection

## 2018-06-02 ENCOUNTER — Other Ambulatory Visit: Payer: Self-pay | Admitting: Adult Health

## 2018-06-02 ENCOUNTER — Ambulatory Visit: Payer: Self-pay

## 2018-06-16 ENCOUNTER — Ambulatory Visit: Payer: BLUE CROSS/BLUE SHIELD

## 2018-06-16 VITALS — BP 110/64 | HR 111 | Temp 98.1°F | Wt 231.0 lb

## 2018-06-16 DIAGNOSIS — E349 Endocrine disorder, unspecified: Secondary | ICD-10-CM | POA: Diagnosis not present

## 2018-06-16 MED ORDER — TESTOSTERONE CYPIONATE 200 MG/ML IM SOLN
200.0000 mg | Freq: Once | INTRAMUSCULAR | Status: AC
  Administered 2018-06-16: 200 mg via INTRAMUSCULAR

## 2018-06-16 NOTE — Progress Notes (Signed)
Patient is here for a NV for his Testosterone Cypionate injection 200 mg/ml 2 ml IM inRIGHTupper outer quadrant. Patient tolerated well and will return in 2 weeks for the next injection

## 2018-06-30 ENCOUNTER — Ambulatory Visit: Payer: BLUE CROSS/BLUE SHIELD | Admitting: *Deleted

## 2018-06-30 VITALS — BP 110/66 | HR 84 | Temp 97.2°F | Resp 16 | Ht 69.5 in | Wt 231.0 lb

## 2018-06-30 DIAGNOSIS — E349 Endocrine disorder, unspecified: Secondary | ICD-10-CM | POA: Diagnosis not present

## 2018-06-30 MED ORDER — TESTOSTERONE CYPIONATE 200 MG/ML IM SOLN
400.0000 mg | Freq: Once | INTRAMUSCULAR | Status: AC
  Administered 2018-06-30: 400 mg via INTRAMUSCULAR

## 2018-06-30 NOTE — Progress Notes (Signed)
Patient is here for Testosterone Cypionate 200 mg/ml 2 ml injection in left upper outer quadrant IM.  Patient tolerated well.

## 2018-07-13 ENCOUNTER — Other Ambulatory Visit: Payer: Self-pay | Admitting: Adult Health

## 2018-07-13 ENCOUNTER — Other Ambulatory Visit: Payer: Self-pay | Admitting: Internal Medicine

## 2018-07-14 ENCOUNTER — Ambulatory Visit: Payer: BLUE CROSS/BLUE SHIELD

## 2018-07-14 ENCOUNTER — Other Ambulatory Visit: Payer: Self-pay

## 2018-07-14 VITALS — BP 126/82 | Ht 69.5 in | Wt 230.0 lb

## 2018-07-14 DIAGNOSIS — E349 Endocrine disorder, unspecified: Secondary | ICD-10-CM

## 2018-07-14 NOTE — Progress Notes (Signed)
Patient is here for a NV for his Testosterone Cypionate injection 200 mg/ml 2 ml IM in LEFT upper outer quadrant.   Patient tolerated well and will return in 2 weeks for the next injection.   Patient reports a knot on his right glute area due to injection, I offered patient to schedule an appt but he declined to stated that he has a CPE coming up & will just talk to the provider at that time. He reports no pain at site. Knot has been there 10 wks. He just doesn't want this to stop his injection per pt.  Vitals entered into epic at the time of visit.

## 2018-07-16 ENCOUNTER — Other Ambulatory Visit: Payer: Self-pay | Admitting: Physician Assistant

## 2018-07-16 ENCOUNTER — Other Ambulatory Visit: Payer: Self-pay | Admitting: Internal Medicine

## 2018-07-28 ENCOUNTER — Ambulatory Visit: Payer: BLUE CROSS/BLUE SHIELD

## 2018-07-28 ENCOUNTER — Other Ambulatory Visit: Payer: Self-pay

## 2018-07-28 VITALS — BP 170/98 | HR 105 | Temp 97.7°F | Wt 233.0 lb

## 2018-07-28 DIAGNOSIS — E349 Endocrine disorder, unspecified: Secondary | ICD-10-CM | POA: Diagnosis not present

## 2018-07-28 MED ORDER — TESTOSTERONE CYPIONATE 200 MG/ML IM SOLN
200.0000 mg | Freq: Once | INTRAMUSCULAR | Status: AC
  Administered 2018-07-28: 200 mg via INTRAMUSCULAR

## 2018-07-28 NOTE — Progress Notes (Signed)
Patient is here for Testosterone Cypionate 200 mg/ml 2 ml injection in left upper outer quadrant IM.  Patient tolerated well. Patient's blood pressure elevated, he states that he hasn't taken his lisinopril for 4 days.   Vitals taken and recorded.

## 2018-08-11 ENCOUNTER — Other Ambulatory Visit: Payer: Self-pay

## 2018-08-11 ENCOUNTER — Ambulatory Visit: Payer: BLUE CROSS/BLUE SHIELD | Admitting: *Deleted

## 2018-08-11 VITALS — BP 136/72 | HR 84 | Temp 97.0°F | Ht 69.5 in | Wt 234.2 lb

## 2018-08-11 DIAGNOSIS — E349 Endocrine disorder, unspecified: Secondary | ICD-10-CM

## 2018-08-11 MED ORDER — TESTOSTERONE CYPIONATE 200 MG/ML IM SOLN
200.0000 mg | Freq: Once | INTRAMUSCULAR | Status: AC
  Administered 2018-08-11: 200 mg via INTRAMUSCULAR

## 2018-08-11 NOTE — Progress Notes (Signed)
Patient is here for Testosterone Cypionate 200 mg/ml 2 ml injection in left upper outer quadrant IM.  Patient tolerated well. 

## 2018-08-18 ENCOUNTER — Other Ambulatory Visit: Payer: Self-pay

## 2018-08-18 ENCOUNTER — Ambulatory Visit (INDEPENDENT_AMBULATORY_CARE_PROVIDER_SITE_OTHER): Payer: BLUE CROSS/BLUE SHIELD | Admitting: Internal Medicine

## 2018-08-18 VITALS — BP 126/88 | HR 72 | Temp 97.2°F | Resp 16 | Ht 69.0 in | Wt 234.4 lb

## 2018-08-18 DIAGNOSIS — Z130 Encounter for screening for diseases of the blood and blood-forming organs and certain disorders involving the immune mechanism: Secondary | ICD-10-CM

## 2018-08-18 DIAGNOSIS — E559 Vitamin D deficiency, unspecified: Secondary | ICD-10-CM

## 2018-08-18 DIAGNOSIS — Z1389 Encounter for screening for other disorder: Secondary | ICD-10-CM | POA: Diagnosis not present

## 2018-08-18 DIAGNOSIS — Z13220 Encounter for screening for lipoid disorders: Secondary | ICD-10-CM | POA: Diagnosis not present

## 2018-08-18 DIAGNOSIS — Z131 Encounter for screening for diabetes mellitus: Secondary | ICD-10-CM | POA: Diagnosis not present

## 2018-08-18 DIAGNOSIS — Z136 Encounter for screening for cardiovascular disorders: Secondary | ICD-10-CM

## 2018-08-18 DIAGNOSIS — Z1211 Encounter for screening for malignant neoplasm of colon: Secondary | ICD-10-CM

## 2018-08-18 DIAGNOSIS — E119 Type 2 diabetes mellitus without complications: Secondary | ICD-10-CM

## 2018-08-18 DIAGNOSIS — Z79899 Other long term (current) drug therapy: Secondary | ICD-10-CM

## 2018-08-18 DIAGNOSIS — Z0000 Encounter for general adult medical examination without abnormal findings: Secondary | ICD-10-CM

## 2018-08-18 DIAGNOSIS — Z1329 Encounter for screening for other suspected endocrine disorder: Secondary | ICD-10-CM

## 2018-08-18 DIAGNOSIS — I10 Essential (primary) hypertension: Secondary | ICD-10-CM

## 2018-08-18 DIAGNOSIS — R5383 Other fatigue: Secondary | ICD-10-CM

## 2018-08-18 DIAGNOSIS — E782 Mixed hyperlipidemia: Secondary | ICD-10-CM

## 2018-08-18 DIAGNOSIS — Z125 Encounter for screening for malignant neoplasm of prostate: Secondary | ICD-10-CM

## 2018-08-18 DIAGNOSIS — Z1212 Encounter for screening for malignant neoplasm of rectum: Secondary | ICD-10-CM

## 2018-08-18 DIAGNOSIS — E349 Endocrine disorder, unspecified: Secondary | ICD-10-CM

## 2018-08-18 DIAGNOSIS — Z0001 Encounter for general adult medical examination with abnormal findings: Secondary | ICD-10-CM

## 2018-08-18 DIAGNOSIS — Z111 Encounter for screening for respiratory tuberculosis: Secondary | ICD-10-CM | POA: Diagnosis not present

## 2018-08-18 DIAGNOSIS — Z8249 Family history of ischemic heart disease and other diseases of the circulatory system: Secondary | ICD-10-CM

## 2018-08-18 NOTE — Patient Instructions (Signed)
Coronavirus (COVID-19) Are you at risk?  Are you at risk for the Coronavirus (COVID-19)?  To be considered HIGH RISK for Coronavirus (COVID-19), you have to meet the following criteria:  . Traveled to [place], [place], [place], [place] or [place]; or in the [place] to [place], [place], [place]  . or [place]; and have fever, cough, and shortness of breath within the last 2 weeks of travel OR . Been in close contact with a person diagnosed with COVID-19 within the last 2 weeks and have  . fever, cough,and shortness of breath .  . IF YOU DO NOT MEET THESE CRITERIA, YOU ARE CONSIDERED LOW RISK FOR COVID-19.  What to do if you are HIGH RISK for COVID-19?  . If you are having a medical emergency, call 911. . Seek medical care right away. Before you go to a doctor's office, urgent care or emergency department, .  call ahead and tell them about your recent travel, contact with someone diagnosed with COVID-19  .  and your symptoms.  . You should receive instructions from your physician's office regarding next steps of care.  . When you arrive at healthcare provider, tell the healthcare staff immediately you have returned from  . visiting [place], [place], [place], [place] or [place]; or traveled in the [place] to [place], [place],  . [place] or [place] in the last two weeks or you have been in close contact with a person diagnosed with  . COVID-19 in the last 2 weeks.   . Tell the health care staff about your symptoms: fever, cough and shortness of breath. . After you have been seen by a medical provider, you will be either: o Tested for (COVID-19) and discharged home on quarantine except to seek medical care if  o symptoms worsen, and asked to  - Stay home and avoid contact with others until you get your results (4-5 days)  - Avoid travel on public transportation if possible (such as bus, train, or airplane) or o Sent to the Emergency Department by EMS for evaluation,  COVID-19 testing  and  o possible admission depending on your condition and test results.  What to do if you are LOW RISK for COVID-19?  Reduce your risk of any infection by using the same precautions used for avoiding the common cold or flu:  . Wash your hands often with soap and warm water for at least 20 seconds.  If soap and water are not readily available,  . use an alcohol-based hand sanitizer with at least 60% alcohol.  . If coughing or sneezing, cover your mouth and nose by coughing or sneezing into the elbow areas of your shirt or coat, .  into a tissue or into your sleeve (not your hands). . Avoid shaking hands with others and consider head nods or verbal greetings only. . Avoid touching your eyes, nose, or mouth with unwashed hands.  . Avoid close contact with people who are sick. . Avoid places or events with large numbers of people in one location, like concerts or sporting events. . Carefully consider travel plans you have or are making. . If you are planning any travel outside or inside the [place], visit the CDC's Travelers' Health webpage for the latest health notices. . If you have some symptoms but not all symptoms, continue to monitor at home and seek medical attention  . if your symptoms worsen. . If you are having a medical emergency, call 911. >>>>>>>>>>>>>>>>>>>>>>>>>>>>   Preventive Care for Adults  A healthy lifestyle and preventive care can promote health and wellness. Preventive health guidelines for men include the following key practices:  A routine yearly physical is a good way to check with your health care provider about your health and preventative screening. It is a chance to share any concerns and updates on your health and to receive a thorough exam.  Visit your dentist for a routine exam and preventative care every 6 months. Brush your teeth twice a day and floss once a day. Good oral hygiene prevents tooth decay and gum disease.  The frequency of eye exams  is based on your age, health, family medical history, use of contact lenses, and other factors. Follow your health care provider's recommendations for frequency of eye exams.  Eat a healthy diet. Foods such as vegetables, fruits, whole grains, low-fat dairy products, and lean protein foods contain the nutrients you need without too many calories. Decrease your intake of foods high in solid fats, added sugars, and salt. Eat the right amount of calories for you. Get information about a proper diet from your health care provider, if necessary.  Regular physical exercise is one of the most important things you can do for your health. Most adults should get at least 150 minutes of moderate-intensity exercise (any activity that increases your heart rate and causes you to sweat) each week. In addition, most adults need muscle-strengthening exercises on 2 or more days a week.  Maintain a healthy weight. The body mass index (BMI) is a screening tool to identify possible weight problems. It provides an estimate of body fat based on height and weight. Your health care provider can find your BMI and can help you achieve or maintain a healthy weight. For adults 20 years and older:  A BMI below 18.5 is considered underweight.  A BMI of 18.5 to 24.9 is normal.  A BMI of 25 to 29.9 is considered overweight.  A BMI of 30 and above is considered obese.  Maintain normal blood lipids and cholesterol levels by exercising and minimizing your intake of saturated fat. Eat a balanced diet with plenty of fruit and vegetables. Blood tests for lipids and cholesterol should begin at age 20 and be repeated every 5 years. If your lipid or cholesterol levels are high, you are over 50, or you are at high risk for heart disease, you may need your cholesterol levels checked more frequently. Ongoing high lipid and cholesterol levels should be treated with medicines if diet and exercise are not working.  If you smoke, find out from  your health care provider how to quit. If you do not use tobacco, do not start.  Lung cancer screening is recommended for adults aged 55-80 years who are at high risk for developing lung cancer because of a history of smoking. A yearly low-dose CT scan of the lungs is recommended for people who have at least a 30-pack-year history of smoking and are a current smoker or have quit within the past 15 years. A pack year of smoking is smoking an average of 1 pack of cigarettes a day for 1 year (for example: 1 pack a day for 30 years or 2 packs a day for 15 years). Yearly screening should continue until the smoker has stopped smoking for at least 15 years. Yearly screening should be stopped for people who develop a health problem that would prevent them from having lung cancer treatment.  If you choose to drink alcohol,   do not have more than 2 drinks per day. One drink is considered to be 12 ounces (355 mL) of beer, 5 ounces (148 mL) of wine, or 1.5 ounces (44 mL) of liquor.  Avoid use of street drugs. Do not share needles with anyone. Ask for help if you need support or instructions about stopping the use of drugs.  High blood pressure causes heart disease and increases the risk of stroke. Your blood pressure should be checked at least every 1-2 years. Ongoing high blood pressure should be treated with medicines, if weight loss and exercise are not effective.  If you are 45-79 years old, ask your health care provider if you should take aspirin to prevent heart disease.  Diabetes screening involves taking a blood sample to check your fasting blood sugar level. This should be done once every 3 years, after age 45, if you are within normal weight and without risk factors for diabetes. Testing should be considered at a younger age or be carried out more frequently if you are overweight and have at least 1 risk factor for diabetes.  Colorectal cancer can be detected and often prevented. Most routine colorectal  cancer screening begins at the age of 50 and continues through age 75. However, your health care provider may recommend screening at an earlier age if you have risk factors for colon cancer. On a yearly basis, your health care provider may provide home test kits to check for hidden blood in the stool. Use of a small camera at the end of a tube to directly examine the colon (sigmoidoscopy or colonoscopy) can detect the earliest forms of colorectal cancer. Talk to your health care provider about this at age 50, when routine screening begins. Direct exam of the colon should be repeated every 5-10 years through age 75, unless early forms of precancerous polyps or small growths are found.   Talk with your health care provider about prostate cancer screening.  Testicular cancer screening isrecommended for adult males. Screening includes self-exam, a health care provider exam, and other screening tests. Consult with your health care provider about any symptoms you have or any concerns you have about testicular cancer.  Use sunscreen. Apply sunscreen liberally and repeatedly throughout the day. You should seek shade when your shadow is shorter than you. Protect yourself by wearing long sleeves, pants, a wide-brimmed hat, and sunglasses year round, whenever you are outdoors.  Once a month, do a whole-body skin exam, using a mirror to look at the skin on your back. Tell your health care provider about new moles, moles that have irregular borders, moles that are larger than a pencil eraser, or moles that have changed in shape or color.  Stay current with required vaccines (immunizations).  Influenza vaccine. All adults should be immunized every year.  Tetanus, diphtheria, and acellular pertussis (Td, Tdap) vaccine. An adult who has not previously received Tdap or who does not know his vaccine status should receive 1 dose of Tdap. This initial dose should be followed by tetanus and diphtheria toxoids (Td) booster  doses every 10 years. Adults with an unknown or incomplete history of completing a 3-dose immunization series with Td-containing vaccines should begin or complete a primary immunization series including a Tdap dose. Adults should receive a Td booster every 10 years.  Varicella vaccine. An adult without evidence of immunity to varicella should receive 2 doses or a second dose if he has previously received 1 dose.  Human papillomavirus (HPV) vaccine. Males aged 13-21   years who have not received the vaccine previously should receive the 3-dose series. Males aged 22-26 years may be immunized. Immunization is recommended through the age of 26 years for any male who has sex with males and did not get any or all doses earlier. Immunization is recommended for any person with an immunocompromised condition through the age of 26 years if he did not get any or all doses earlier. During the 3-dose series, the second dose should be obtained 4-8 weeks after the first dose. The third dose should be obtained 24 weeks after the first dose and 16 weeks after the second dose.  Zoster vaccine. One dose is recommended for adults aged 60 years or older unless certain conditions are present.    PREVNAR  - Pneumococcal 13-valent conjugate (PCV13) vaccine. When indicated, a person who is uncertain of his immunization history and has no record of immunization should receive the PCV13 vaccine. An adult aged 19 years or older who has certain medical conditions and has not been previously immunized should receive 1 dose of PCV13 vaccine. This PCV13 should be followed with a dose of pneumococcal polysaccharide (PPSV23) vaccine. The PPSV23 vaccine dose should be obtained at least 1 r more year(s) after the dose of PCV13 vaccine. An adult aged 19 years or older who has certain medical conditions and previously received 1 or more doses of PPSV23 vaccine should receive 1 dose of PCV13. The PCV13 vaccine dose should be obtained 1 or more  years after the last PPSV23 vaccine dose.    PNEUMOVAX - Pneumococcal polysaccharide (PPSV23) vaccine. When PCV13 is also indicated, PCV13 should be obtained first. All adults aged 65 years and older should be immunized. An adult younger than age 65 years who has certain medical conditions should be immunized. Any person who resides in a nursing home or long-term care facility should be immunized. An adult smoker should be immunized. People with an immunocompromised condition and certain other conditions should receive both PCV13 and PPSV23 vaccines. People with human immunodeficiency virus (HIV) infection should be immunized as soon as possible after diagnosis. Immunization during chemotherapy or radiation therapy should be avoided. Routine use of PPSV23 vaccine is not recommended for American Indians, [place] Natives, or people younger than 65 years unless there are medical conditions that require PPSV23 vaccine. When indicated, people who have unknown immunization and have no record of immunization should receive PPSV23 vaccine. One-time revaccination 5 years after the first dose of PPSV23 is recommended for people aged 19-64 years who have chronic kidney failure, nephrotic syndrome, asplenia, or immunocompromised conditions. People who received 1-2 doses of PPSV23 before age 65 years should receive another dose of PPSV23 vaccine at age 65 years or later if at least 5 years have passed since the previous dose. Doses of PPSV23 are not needed for people immunized with PPSV23 at or after age 65 years.    Hepatitis A vaccine. Adults who wish to be protected from this disease, have certain high-risk conditions, work with hepatitis A-infected animals, work in hepatitis A research labs, or travel to or work in countries with a high rate of hepatitis A should be immunized. Adults who were previously unvaccinated and who anticipate close contact with an international adoptee during the first 60 days after arrival  in the [place] from a country with a high rate of hepatitis A should be immunized.    Hepatitis B vaccine. Adults should be immunized if they wish to be protected from this disease, have certain   high-risk conditions, may be exposed to blood or other infectious body fluids, are household contacts or sex partners of hepatitis B positive people, are clients or workers in certain care facilities, or travel to or work in countries with a high rate of hepatitis B.   Preventive Service / Frequency   Ages 40 to 64  Blood pressure check.  Lipid and cholesterol check  Lung cancer screening. / Every year if you are aged 55-80 years and have a 30-pack-year history of smoking and currently smoke or have quit within the past 15 years. Yearly screening is stopped once you have quit smoking for at least 15 years or develop a health problem that would prevent you from having lung cancer treatment.  Fecal occult blood test (FOBT) of stool. / Every year beginning at age 50 and continuing until age 75. You may not have to do this test if you get a colonoscopy every 10 years.  Flexible sigmoidoscopy** or colonoscopy.** / Every 5 years for a flexible sigmoidoscopy or every 10 years for a colonoscopy beginning at age 50 and continuing until age 75. Screening for abdominal aortic aneurysm (AAA)  by ultrasound is recommended for people who have history of high blood pressure or who are current or former smokers. +++++++++++ Recommend Adult Low Dose Aspirin or  coated  Aspirin 81 mg daily  To reduce risk of Colon Cancer 20 %,  Skin Cancer 26 % ,  Malignant Melanoma 46%  and  Pancreatic cancer 60% ++++++++++++++++++++ Vitamin D goal  is between 70-100.  Please make sure that you are taking your Vitamin D as directed.  It is very important as a natural anti-inflammatory  helping hair, skin, and nails, as well as reducing stroke and heart attack risk.  It helps your bones and helps with mood. It also  decreases numerous cancer risks so please take it as directed.  Low Vit D is associated with a 200-300% higher risk for CANCER  and 200-300% higher risk for HEART   ATTACK  &  STROKE.   ...................................... It is also associated with higher death rate at younger ages,  autoimmune diseases like Rheumatoid arthritis, Lupus, Multiple Sclerosis.    Also many other serious conditions, like depression, Alzheimer's Dementia, infertility, muscle aches, fatigue, fibromyalgia - just to name a few. +++++++++++++++++++++ Recommend the book "The END of DIETING" by Dr Joel Fuhrman  & the book "The END of DIABETES " by Dr Joel Fuhrman At [employer] - get book & Audio CD's    Being diabetic has a  300% increased risk for heart attack, stroke, cancer, and alzheimer- type vascular dementia. It is very important that you work harder with diet by avoiding all foods that are white. Avoid white rice (brown & wild rice is OK), white potatoes (sweetpotatoes in moderation is OK), White bread or wheat bread or anything made out of white flour like bagels, donuts, rolls, buns, biscuits, cakes, pastries, cookies, pizza crust, and pasta (made from white flour & egg whites) - vegetarian pasta or spinach or wheat pasta is OK. Multigrain breads like Arnold's or [employer], or multigrain sandwich thins or flatbreads.  Diet, exercise and weight loss can reverse and cure diabetes in the early stages.  Diet, exercise and weight loss is very important in the control and prevention of complications of diabetes which affects every system in your body, ie. Brain - dementia/stroke, eyes - glaucoma/blindness, heart - heart attack/heart failure, kidneys - dialysis, stomach - gastric paralysis, intestines - malabsorption,   nerves - severe painful neuritis, circulation - gangrene & loss of a leg(s), and finally cancer and Alzheimers.    I recommend avoid fried & greasy foods,  sweets/candy, white rice (brown or wild rice or  Quinoa is OK), white potatoes (sweet potatoes are OK) - anything made from white flour - bagels, doughnuts, rolls, buns, biscuits,white and wheat breads, pizza crust and traditional pasta made of white flour & egg white(vegetarian pasta or spinach or wheat pasta is OK).  Multi-grain bread is OK - like multi-grain flat bread or sandwich thins. Avoid alcohol in excess. Exercise is also important.    Eat all the vegetables you want - avoid meat, especially red meat and dairy - especially cheese.  Cheese is the most concentrated form of trans-fats which is the worst thing to clog up our arteries. Veggie cheese is OK which can be found in the fresh produce section at [employer] or [employer] or [employer]  ++++++++++++++++++++++ DASH Eating Plan  DASH stands for "Dietary Approaches to Stop Hypertension."   The DASH eating plan is a healthy eating plan that has been shown to reduce high blood pressure (hypertension). Additional health benefits may include reducing the risk of type 2 diabetes mellitus, heart disease, and stroke. The DASH eating plan may also help with weight loss. WHAT DO I NEED TO KNOW ABOUT THE DASH EATING PLAN? For the DASH eating plan, you will follow these general guidelines:  Choose foods with a percent daily value for sodium of less than 5% (as listed on the food label).  Use salt-free seasonings or herbs instead of table salt or sea salt.  Check with your health care provider or pharmacist before using salt substitutes.  Eat lower-sodium products, often labeled as "lower sodium" or "no salt added."  Eat fresh foods.  Eat more vegetables, fruits, and low-fat dairy products.  Choose whole grains. Look for the word "whole" as the first word in the ingredient list.  Choose fish   Limit sweets, desserts, sugars, and sugary drinks.  Choose heart-healthy fats.  Eat veggie cheese   Eat more home-cooked food and less restaurant, buffet, and fast food.  Limit fried  foods.  Cook foods using methods other than frying.  Limit canned vegetables. If you do use them, rinse them well to decrease the sodium.  When eating at a restaurant, ask that your food be prepared with less salt, or no salt if possible.                      WHAT FOODS CAN I EAT? Read Dr Joel Fuhrman's books on The End of Dieting & The End of Diabetes  Grains Whole grain or whole wheat bread. Brown rice. Whole grain or whole wheat pasta. Quinoa, bulgur, and whole grain cereals. Low-sodium cereals. Corn or whole wheat flour tortillas. Whole grain cornbread. Whole grain crackers. Low-sodium crackers.  Vegetables Fresh or frozen vegetables (raw, steamed, roasted, or grilled). Low-sodium or reduced-sodium tomato and vegetable juices. Low-sodium or reduced-sodium tomato sauce and paste. Low-sodium or reduced-sodium canned vegetables.   Fruits All fresh, canned (in natural juice), or frozen fruits.  Protein Products  All fish and seafood.  Dried beans, peas, or lentils. Unsalted nuts and seeds. Unsalted canned beans.  Dairy Low-fat dairy products, such as skim or 1% milk, 2% or reduced-fat cheeses, low-fat ricotta or cottage cheese, or plain low-fat yogurt. Low-sodium or reduced-sodium cheeses.  Fats and Oils Tub margarines without trans fats. Light or reduced-fat mayonnaise   and salad dressings (reduced sodium). Avocado. Safflower, olive, or canola oils. Natural peanut or almond butter.  Other Unsalted popcorn and pretzels. The items listed above may not be a complete list of recommended foods or beverages. Contact your dietitian for more options.  +++++++++++++++++++  WHAT FOODS ARE NOT RECOMMENDED? Grains/ White flour or wheat flour White bread. White pasta. White rice. Refined cornbread. Bagels and croissants. Crackers that contain trans fat.  Vegetables  Creamed or fried vegetables. Vegetables in a . Regular canned vegetables. Regular canned tomato sauce and paste. Regular  tomato and vegetable juices.  Fruits Dried fruits. Canned fruit in light or heavy syrup. Fruit juice.  Meat and Other Protein Products Meat in general - RED meat & White meat.  Fatty cuts of meat. Ribs, chicken wings, all processed meats as bacon, sausage, bologna, salami, fatback, hot dogs, bratwurst and packaged luncheon meats.  Dairy Whole or 2% milk, cream, half-and-half, and cream cheese. Whole-fat or sweetened yogurt. Full-fat cheeses or blue cheese. Non-dairy creamers and whipped toppings. Processed cheese, cheese spreads, or cheese curds.  Condiments Onion and garlic salt, seasoned salt, table salt, and sea salt. Canned and packaged gravies. Worcestershire sauce. Tartar sauce. Barbecue sauce. Teriyaki sauce. Soy sauce, including reduced sodium. Steak sauce. Fish sauce. Oyster sauce. Cocktail sauce. Horseradish. Ketchup and mustard. Meat flavorings and tenderizers. Bouillon cubes. Hot sauce. Tabasco sauce. Marinades. Taco seasonings. Relishes.  Fats and Oils Butter, stick margarine, lard, shortening and bacon fat. Coconut, palm kernel, or palm oils. Regular salad dressings.  Pickles and olives. Salted popcorn and pretzels.  The items listed above may not be a complete list of foods and beverages to avoid.    

## 2018-08-18 NOTE — Progress Notes (Signed)
[facility]   [facility], M.D.     No[facility] Dennis. Noah Dennis, P.A.-C Noah Gaudier, DNP [facility]                [address]                [place], [place]. [address] Telephone [phone] Telefax [phone] Annual  Screening/Preventative Visit  & Comprehensive Evaluation & Examination  History of Present Illness:     This very nice 48 y.o. single WM  presents for a Screening /Preventative Visit & comprehensive evaluation and management of multiple medical co-morbidities.  Patient has been followed for HTN, HLD, T2_NIDDM  and Vitamin D Deficiency.     HTN predates since     . Patient's BP has been controlled at home.  Today's BP: 126/88. Patient denies any cardiac symptoms as chest pain, palpitations, shortness of breath, dizziness or ankle swelling.     Patient's hyperlipidemia is controlled with diet and medications. Patient denies myalgias or other medication SE's. Last lipids were nearly at goal: Lab Results  Component Value Date   CHOL 170 11/30/2017   HDL 42 11/30/2017   LDLCALC 101 (H) 11/30/2017   TRIG 177 (H) 11/30/2017   CHOLHDL 4.0 11/30/2017      Patient has hx/o T2_NIDDM since    and patient denies reactive hypoglycemic symptoms, visual blurring, diabetic polys or paresthesias. Last A1c was not at goal: Lab Results  Component Value Date   HGBA1C 7.4 (H) 11/30/2017       Finally, patient has history of Vitamin D Deficiency of    and last vitamin D was not atgoal: Lab Results  Component Value Date   VD25OH 65 07/29/2017    Current Outpatient Medications on File Prior to Visit  Medication Sig  . Cholecalciferol (VITAMIN D-3) 5000 units TABS Take 5,000 Units by mouth 2 (two) times daily.  Marland Kitchen doxycycline (VIBRAMYCIN) 100 MG capsule TAKE 1 CAPSULE BY MOUTH TWICE DAILY FOR ROSACEA  . finasteride (PROSCAR) 5 MG tablet TAKE 1 TABLET (5 MG TOTAL) BY MOUTH DAILY.  Marland Kitchen lisinopril (PRINIVIL,ZESTRIL) 20 MG  tablet TAKE 1 TABLET BY MOUTH EVERY DAY  . metFORMIN (GLUCOPHAGE-XR) 500 MG 24 hr tablet TAKE 1 TAB WITH LARGEST MEAL OF DAY ADD 1 TAB TO 2ND LARGEST MEAL OF DAY IN 2-3 WEEKS IF TOLERATED  . rosuvastatin (CRESTOR) 40 MG tablet TAKE 1 TABLET BY MOUTH EVERY DAY  . tadalafil (CIALIS) 20 MG tablet 1/2-1 tablet as needed for erectile dysfunction  . testosterone cypionate (DEPOTESTOSTERONE CYPIONATE) 200 MG/ML injection Inject 2 ml into the muscle every 2 weeks.  . phentermine (ADIPEX-P) 37.5 MG tablet Take 1 tablet (37.5 mg total) by mouth daily before breakfast. (Patient not taking: Reported on 08/18/2018)  . terbinafine (LAMISIL) 250 MG tablet TAKE 1 TABLET BY MOUTH EVERY DAY (Patient not taking: Reported on 08/18/2018)   Current Facility-Administered Medications on File Prior to Visit  Medication  . testosterone cypionate (DEPOTESTOSTERONE CYPIONATE) injection 200 mg  . testosterone cypionate (DEPOTESTOSTERONE CYPIONATE) injection 200 mg   No Known Allergies Past Medical History:  Diagnosis Date  . Hyperlipidemia   . Hypertension   . Hypogonadism male   . Insulin resistance   . Pre-diabetes    Health Maintenance  Topic Date Due  . OPHTHALMOLOGY EXAM  09/15/1980  . HEMOGLOBIN A1C  06/02/2018  . INFLUENZA VACCINE  11/26/2018  . FOOT EXAM  08/18/2019  . TETANUS/TDAP  05/09/2024  .  PNEUMOCOCCAL POLYSACCHARIDE VACCINE AGE 41-64 HIGH RISK  Completed  . HIV Screening  Completed   Immunization History  Administered Date(s) Administered  . PPD Test 05/08/2013, 05/09/2014, 05/27/2015, 06/15/2016, 07/29/2017  . Pneumococcal Conjugate-13 07/29/2017  . Pneumococcal Polysaccharide-23 01/08/2010, 05/09/2014  . Td 10/20/2004  . Tdap 05/09/2014  . Zoster 06/05/2015   Last Colon -   Past Surgical History:  Procedure Laterality Date  . SPLENECTOMY     Family History  Problem Relation Age of Onset  . Diabetes Mother   . Hypertension Mother   . Heart disease Mother   . Hypertension Father    . Cancer Father    Social History   Socioeconomic History  . Marital status: Single    Spouse name: Not on file  . Number of children: Not on file  . Years of education: Not on file  . Highest education level: Not on file  Occupational History  . Not on file  Tobacco Use  . Smoking status: Never Smoker  . Smokeless tobacco: Never Used  Substance and Sexual Activity  . Alcohol use: Yes    Comment: occasionally  . Drug use: No  . Sexual activity: Not on file    ROS Constitutional: Denies fever, chills, weight loss/gain, headaches, insomnia,  night sweats or change in appetite. Does c/o fatigue. Eyes: Denies redness, blurred vision, diplopia, discharge, itchy or watery eyes.  ENT: Denies discharge, congestion, post nasal drip, epistaxis, sore throat, earache, hearing loss, dental pain, Tinnitus, Vertigo, Sinus pain or snoring.  Cardio: Denies chest pain, palpitations, irregular heartbeat, syncope, dyspnea, diaphoresis, orthopnea, PND, claudication or edema Respiratory: denies cough, dyspnea, DOE, pleurisy, hoarseness, laryngitis or wheezing.  Gastrointestinal: Denies dysphagia, heartburn, reflux, water brash, pain, cramps, nausea, vomiting, bloating, diarrhea, constipation, hematemesis, melena, hematochezia, jaundice or hemorrhoids Genitourinary: Denies dysuria, frequency, urgency, nocturia, hesitancy, discharge, hematuria or flank pain Musculoskeletal: Denies arthralgia, myalgia, stiffness, Jt. Swelling, pain, limp or strain/sprain. Denies Falls. Skin: Denies puritis, rash, hives, warts, acne, eczema or change in skin lesion Neuro: No weakness, tremor, incoordination, spasms, paresthesia or pain Psychiatric: Denies confusion, memory loss or sensory loss. Denies Depression. Endocrine: Denies change in weight, skin, hair change, nocturia, and paresthesia, diabetic polys, visual blurring or hyper / hypo glycemic episodes.  Heme/Lymph: No excessive bleeding, bruising or enlarged lymph  nodes.  Physical Exam  BP 126/88   Pulse 72   Temp (!) 97.2 F (36.2 C)   Resp 16   Ht 5\' 9"  (1.753 m)   Wt 234 lb 6.4 oz (106.3 kg)   BMI 34.61 kg/m   General Appearance: Well nourished and well groomed and in no apparent distress.  Eyes: PERRLA, EOMs, conjunctiva no swelling or erythema, normal fundi and vessels. Sinuses: No frontal/maxillary tenderness ENT/Mouth: EACs patent / TMs  nl. Nares clear without erythema, swelling, mucoid exudates. Oral hygiene is good. No erythema, swelling, or exudate. Tongue normal, non-obstructing. Tonsils not swollen or erythematous. Hearing normal.  Neck: Supple, thyroid not palpable. No bruits, nodes or JVD. Respiratory: Respiratory effort normal.  BS equal and clear bilateral without rales, rhonci, wheezing or stridor. Cardio: Heart sounds are normal with regular rate and rhythm and no murmurs, rubs or gallops. Peripheral pulses are normal and equal bilaterally without edema. No aortic or femoral bruits. Chest: symmetric with normal excursions and percussion.  Abdomen: Soft, with Nl bowel sounds. Nontender, no guarding, rebound, hernias, masses, or organomegaly.  Lymphatics: Non tender without lymphadenopathy.  Musculoskeletal: Full ROM all peripheral extremities, joint stability, 5/5 strength,  and normal gait. Skin: Warm and dry without rashes, lesions, cyanosis, clubbing or  ecchymosis.  Neuro: Cranial nerves intact, reflexes equal bilaterally. Normal muscle tone, no cerebellar symptoms. Sensation intact to touch, vibratory and Monofilament to the toes bilaterally. Pysch: Alert and oriented X 3 with normal affect, insight and judgment appropriate.   Assessment and Plan  1. Annual Preventative/Screening Exam   2. Essential hypertension  - EKG 12-Lead - Urinalysis, Routine w reflex microscopic - Microalbumin / creatinine urine ratio - [facility] DIABETES FOOT EXAM - LOW EXTREMITY NEUR EXAM DOCUM - CBC with Differential/Platelet - COMPLETE  METABOLIC PANEL WITH GFR - Magnesium - TSH  3. Hyperlipidemia, mixed  - EKG 12-Lead - Urinalysis, Routine w reflex microscopic  4. Type 2 diabetes mellitus without complication, without long-term current use of insulin (HCC)  - EKG 12-Lead - [facility] DIABETES FOOT EXAM - LOW EXTREMITY NEUR EXAM DOCUM - Hemoglobin A1c - Insulin, random  5. Vitamin D deficiency  - Urinalysis, Routine w reflex microscopic - VITAMIN D 25 Hydroxyl  6. Testosterone deficiency  - Testosterone  7. Screening examination for pulmonary tuberculosis  - TB Skin Test  8. Screening for colorectal cancer  - POC Hemoccult Bld/Stl   9. Prostate cancer screening  - PSA  10. Screening for ischemic heart disease  - EKG 12-Lead  11. FHx: heart disease  - EKG 12-Lead - Microalbumin / creatinine urine ratio  12. Fatigue  - Iron,Total/Total Iron Binding Cap - Vitamin B12 - CBC with Differential/Platelet  13. Medication management  - CBC with Differential/Platelet - COMPLETE METABOLIC PANEL WITH GFR - Magnesium - Lipid panel - TSH - Hemoglobin A1c - Insulin, random - VITAMIN D 25 Hydroxyl        Patient was counseled in prudent diet, weight control to achieve/maintain BMI less than 25, BP monitoring, regular exercise and medications as discussed.  Discussed med effects and SE's. Routine screening labs and tests as requested with regular follow-up as recommended. I discussed the assessment and treatment plan as above with the patient. The patient was provided an opportunity to ask questions and all were answered. The patient agreed with the plan and demonstrated an understanding of the instructions.Over 40 minutes of exam, counseling, chart review and high complex critical decision making was performed   Marinus Maw, MD

## 2018-08-19 LAB — CBC WITH DIFFERENTIAL/PLATELET
Absolute Monocytes: 1210 cells/uL — ABNORMAL HIGH (ref 200–950)
Basophils Absolute: 144 cells/uL (ref 0–200)
Basophils Relative: 1 %
Eosinophils Absolute: 446 cells/uL (ref 15–500)
Eosinophils Relative: 3.1 %
HCT: 51.3 % — ABNORMAL HIGH (ref 38.5–50.0)
Hemoglobin: 18.2 g/dL — ABNORMAL HIGH (ref 13.2–17.1)
Lymphs Abs: 3442 cells/uL (ref 850–3900)
MCH: 32.4 pg (ref 27.0–33.0)
MCHC: 35.5 g/dL (ref 32.0–36.0)
MCV: 91.3 fL (ref 80.0–100.0)
MPV: 9.4 fL (ref 7.5–12.5)
Monocytes Relative: 8.4 %
Neutro Abs: 9158 cells/uL — ABNORMAL HIGH (ref 1500–7800)
Neutrophils Relative %: 63.6 %
Platelets: 516 10*3/uL — ABNORMAL HIGH (ref 140–400)
RBC: 5.62 10*6/uL (ref 4.20–5.80)
RDW: 12.6 % (ref 11.0–15.0)
Total Lymphocyte: 23.9 %
WBC: 14.4 10*3/uL — ABNORMAL HIGH (ref 3.8–10.8)

## 2018-08-19 LAB — VITAMIN B12: Vitamin B-12: 848 pg/mL (ref 200–1100)

## 2018-08-19 LAB — URINALYSIS, ROUTINE W REFLEX MICROSCOPIC
Bilirubin Urine: NEGATIVE
Hgb urine dipstick: NEGATIVE
Ketones, ur: NEGATIVE
Leukocytes,Ua: NEGATIVE
Nitrite: NEGATIVE
Protein, ur: NEGATIVE
Specific Gravity, Urine: 1.035 (ref 1.001–1.03)
pH: <=5 (ref 5.0–8.0)

## 2018-08-19 LAB — COMPLETE METABOLIC PANEL WITH GFR
AG Ratio: 1.6 (calc) (ref 1.0–2.5)
ALT: 26 U/L (ref 9–46)
AST: 20 U/L (ref 10–40)
Albumin: 4.1 g/dL (ref 3.6–5.1)
Alkaline phosphatase (APISO): 63 U/L (ref 36–130)
BUN: 8 mg/dL (ref 7–25)
CO2: 26 mmol/L (ref 20–32)
Calcium: 9.4 mg/dL (ref 8.6–10.3)
Chloride: 98 mmol/L (ref 98–110)
Creat: 1.1 mg/dL (ref 0.60–1.35)
GFR, Est African American: 92 mL/min/{1.73_m2} (ref >=60)
GFR, Est Non African American: 80 mL/min/{1.73_m2} (ref >=60)
Globulin: 2.6 g/dL (calc) (ref 1.9–3.7)
Glucose, Bld: 299 mg/dL — ABNORMAL HIGH (ref 65–99)
Potassium: 4.1 mmol/L (ref 3.5–5.3)
Sodium: 134 mmol/L — ABNORMAL LOW (ref 135–146)
Total Bilirubin: 0.9 mg/dL (ref 0.2–1.2)
Total Protein: 6.7 g/dL (ref 6.1–8.1)

## 2018-08-19 LAB — INSULIN, RANDOM: Insulin: 29.8 u[IU]/mL — ABNORMAL HIGH

## 2018-08-19 LAB — LIPID PANEL
Cholesterol: 156 mg/dL (ref <200)
HDL: 33 mg/dL — ABNORMAL LOW (ref >=40)
LDL Cholesterol (Calc): 89 mg/dL (calc)
Non-HDL Cholesterol (Calc): 123 mg/dL (calc) (ref <130)
Total CHOL/HDL Ratio: 4.7 (calc) (ref <5.0)
Triglycerides: 244 mg/dL — ABNORMAL HIGH (ref <150)

## 2018-08-19 LAB — IRON, TOTAL/TOTAL IRON BINDING CAP
%SAT: 25 % (calc) (ref 20–48)
TIBC: 354 mcg/dL (calc) (ref 250–425)

## 2018-08-19 LAB — HEMOGLOBIN A1C
Hgb A1c MFr Bld: 8.4 % of total Hgb — ABNORMAL HIGH (ref <5.7)
Mean Plasma Glucose: 194 (calc)
eAG (mmol/L): 10.8 (calc)

## 2018-08-19 LAB — TESTOSTERONE: Testosterone: 714 ng/dL (ref 250–827)

## 2018-08-19 LAB — MICROALBUMIN / CREATININE URINE RATIO
Creatinine, Urine: 88 mg/dL (ref 20–320)
Microalb Creat Ratio: 7 mcg/mg creat (ref <30)
Microalb, Ur: 0.6 mg/dL

## 2018-08-19 LAB — VITAMIN D 25 HYDROXY (VIT D DEFICIENCY, FRACTURES): Vit D, 25-Hydroxy: 43 ng/mL (ref 30–100)

## 2018-08-19 LAB — TSH: TSH: 1.09 mIU/L (ref 0.40–4.50)

## 2018-08-19 LAB — MAGNESIUM: Magnesium: 1.5 mg/dL (ref 1.5–2.5)

## 2018-08-19 LAB — PSA: PSA: 0.6 ng/mL (ref <=4.0)

## 2018-08-19 LAB — IRON,?TOTAL/TOTAL IRON BINDING CAP: Iron: 88 ug/dL (ref 50–180)

## 2018-08-21 ENCOUNTER — Encounter: Payer: Self-pay | Admitting: Internal Medicine

## 2018-08-25 ENCOUNTER — Other Ambulatory Visit: Payer: Self-pay

## 2018-08-25 ENCOUNTER — Other Ambulatory Visit: Payer: Self-pay | Admitting: Internal Medicine

## 2018-08-25 ENCOUNTER — Ambulatory Visit: Payer: BLUE CROSS/BLUE SHIELD

## 2018-08-25 VITALS — BP 130/86 | HR 91 | Temp 97.0°F | Wt 230.0 lb

## 2018-08-25 DIAGNOSIS — E349 Endocrine disorder, unspecified: Secondary | ICD-10-CM | POA: Diagnosis not present

## 2018-08-25 MED ORDER — LEVOFLOXACIN 500 MG PO TABS: ORAL_TABLET | ORAL | 1 refills | Status: DC

## 2018-08-25 MED ORDER — TESTOSTERONE CYPIONATE 200 MG/ML IM SOLN
200.0000 mg | Freq: Once | INTRAMUSCULAR | Status: AC
  Administered 2018-08-25: 11:00:00 200 mg via INTRAMUSCULAR

## 2018-08-25 NOTE — Progress Notes (Signed)
Patient is here for Testosterone Cypionate 200 mg/ml 2 ml injection in left upper outer quadrant IM. Patient tolerated well.

## 2018-09-07 NOTE — Progress Notes (Signed)
Assessment and Plan:  Noah Dennis was seen today for acute visit.  Diagnoses and all orders for this visit:  Lump in chest Appears as normal variation of xyphoid process; not suggestive of cyst or mass Advised to monitor for changes and contact us if growing larger, becoming tender, with redness, fluctuance or discharge. Obtain imaging if any changes.   Leukocytosis, unspecified type Still completing prophylactic abx initiated by Dr. Oneta RackMckeown; check CBC for resolution at next testosterone injection appointment  -     CBC with Differential/Platelet; Future  Testosterone deficiency Injection due today; - continue replacement therapy, check testosterone levels as needed.   Other orders -     metFORMIN (GLUCOPHAGE-XR) 500 MG 24 hr tablet; Take 4 tabs daily spread over meals.  Further disposition pending results of labs. Discussed med's effects and SE's.   Over 15 minutes of exam, counseling, chart review, and critical decision making was performed.   Future Appointments  Date Time Provider Department Center  09/08/2018 10:30 AM [name], [name], NP [facility] None  08/30/2019  2:00 PM Lucky CowboyMcKeown, William, MD [facility] None    ------------------------------------------------------------------------------------------------------------------   HPI 48 y.o.male presents for evaluation of lump on chest and for testosterone injection.   He was just seen for CPE in April 2020. Since then, he reports noting a painless lump of distal sternal area. Not changing over the last few weeks, non-tender.   He has a history of testosterone deficiency and is on testosterone replacement. He states that the testosterone helps with his energy, libido, muscle mass. Lab Results  Component Value Date   TESTOSTERONE 714 08/18/2018   His blood pressure has been controlled at home, today their BP is BP: 130/90  He does not workout. He denies chest pain, shortness of breath, dizziness.    Past Medical History:   Diagnosis Date  . Hyperlipidemia   . Hypertension   . Hypogonadism male   . Insulin resistance   . Pre-diabetes      No Known Allergies  Current Outpatient Medications on File Prior to Visit  Medication Sig  . Cholecalciferol (VITAMIN D-3) 5000 units TABS Take 5,000 Units by mouth 2 (two) times daily.  Marland Kitchen. doxycycline (VIBRAMYCIN) 100 MG capsule TAKE 1 CAPSULE BY MOUTH TWICE DAILY FOR ROSACEA  . finasteride (PROSCAR) 5 MG tablet TAKE 1 TABLET (5 MG TOTAL) BY MOUTH DAILY.  Marland Kitchen. levofloxacin (LEVAQUIN) 500 MG tablet Take 1 tablet daily with food for infection  . lisinopril (PRINIVIL,ZESTRIL) 20 MG tablet TAKE 1 TABLET BY MOUTH EVERY DAY  . metFORMIN (GLUCOPHAGE-XR) 500 MG 24 hr tablet TAKE 1 TAB WITH LARGEST MEAL OF DAY ADD 1 TAB TO 2ND LARGEST MEAL OF DAY IN 2-3 WEEKS IF TOLERATED  . phentermine (ADIPEX-P) 37.5 MG tablet Take 1 tablet (37.5 mg total) by mouth daily before breakfast. (Patient not taking: Reported on 08/18/2018)  . rosuvastatin (CRESTOR) 40 MG tablet TAKE 1 TABLET BY MOUTH EVERY DAY  . tadalafil (CIALIS) 20 MG tablet 1/2-1 tablet as needed for erectile dysfunction  . terbinafine (LAMISIL) 250 MG tablet TAKE 1 TABLET BY MOUTH EVERY DAY (Patient not taking: Reported on 08/18/2018)  . testosterone cypionate (DEPOTESTOSTERONE CYPIONATE) 200 MG/ML injection Inject 2 ml into the muscle every 2 weeks.   Current Facility-Administered Medications on File Prior to Visit  Medication  . testosterone cypionate (DEPOTESTOSTERONE CYPIONATE) injection 200 mg  . testosterone cypionate (DEPOTESTOSTERONE CYPIONATE) injection 200 mg    ROS: all negative except above.   Physical Exam:  There were no vitals  taken for this visit.  General Appearance: Well nourished, in no apparent distress. Eyes: conjunctiva no swelling or erythema ENT/Mouth: Hearing normal.  Neck: Supple Respiratory: Respiratory effort normal, BS equal bilaterally without rales, rhonchi, wheezing or stridor.  Cardio: RRR  with no MRGs. Brisk peripheral pulses without edema.  Abdomen: Soft, + BS.  Non tender, no guarding, rebound, hernias, masses. Lymphatics: Non tender without lymphadenopathy.  Musculoskeletal: Full ROM, 5/5 strength, normal gait. "lump" in chest at distal end of sternum; confluent, nonfluctuent; appears anteriorly tilted xyphoid process Skin: Warm, dry without rashes, lesions, ecchymosis.  Neuro: Normal muscle tone, Sensation intact.  Psych: Awake and oriented X 3, normal affect, Insight and Judgment appropriate.     Dan Maker, NP 9:56 AM [facility]

## 2018-09-08 ENCOUNTER — Ambulatory Visit (INDEPENDENT_AMBULATORY_CARE_PROVIDER_SITE_OTHER): Payer: BLUE CROSS/BLUE SHIELD | Admitting: Adult Health

## 2018-09-08 ENCOUNTER — Encounter: Payer: Self-pay | Admitting: Adult Health

## 2018-09-08 ENCOUNTER — Other Ambulatory Visit: Payer: Self-pay

## 2018-09-08 ENCOUNTER — Ambulatory Visit: Payer: BLUE CROSS/BLUE SHIELD

## 2018-09-08 VITALS — BP 130/90 | HR 96 | Temp 97.7°F | Ht 69.0 in | Wt 232.0 lb

## 2018-09-08 DIAGNOSIS — E349 Endocrine disorder, unspecified: Secondary | ICD-10-CM | POA: Diagnosis not present

## 2018-09-08 DIAGNOSIS — R222 Localized swelling, mass and lump, trunk: Secondary | ICD-10-CM | POA: Diagnosis not present

## 2018-09-08 DIAGNOSIS — D72829 Elevated white blood cell count, unspecified: Secondary | ICD-10-CM | POA: Diagnosis not present

## 2018-09-08 MED ORDER — TESTOSTERONE CYPIONATE 200 MG/ML IM SOLN
200.0000 mg | Freq: Once | INTRAMUSCULAR | Status: AC
  Administered 2018-09-08: 200 mg via INTRAMUSCULAR

## 2018-09-08 MED ORDER — METFORMIN HCL ER 500 MG PO TB24: ORAL_TABLET | ORAL | 5 refills | Status: DC

## 2018-09-08 MED ORDER — METFORMIN HCL ER 500 MG PO TB24: ORAL_TABLET | ORAL | 1 refills | Status: DC

## 2018-09-08 NOTE — Addendum Note (Signed)
Addended by: Dionicio Stall on: 09/08/2018 11:15 AM   Modules accepted: Orders

## 2018-09-08 NOTE — Patient Instructions (Addendum)
The "growth" appears to be your xyphoid process (distal tip of your sternal bone) - monitor to make sure no changes - likely just normal anatomy for you    Increase metformin slowly to take 4 tabs a day - can do 2 tab twice daily, or 1 tab with each meal except largest take 2 tabs  Increase slowly to minimize GI side effects   Metformin extended-release tablets What is this medicine? METFORMIN (met FOR min) is used to treat type 2 diabetes. It helps to control blood sugar. Treatment is combined with diet and exercise. This medicine can be used alone or with other medicines for diabetes. This medicine may be used for other purposes; ask your health care provider or pharmacist if you have questions. COMMON BRAND NAME(S): Fortamet, Glucophage XR, Glumetza What should I tell my health care provider before I take this medicine? They need to know if you have any of these conditions: -anemia -dehydration -heart disease -frequently drink alcohol-containing beverages -kidney disease -liver disease -polycystic ovary syndrome -serious infection or injury -vomiting -an unusual or allergic reaction to metformin, other medicines, foods, dyes, or preservatives -pregnant or trying to get pregnant -breast-feeding How should I use this medicine? Take this medicine by mouth with a glass of water. Follow the directions on the prescription label. Take this medicine with food. Take your medicine at regular intervals. Do not take your medicine more often than directed. Do not stop taking except on your doctor's advice. Talk to your pediatrician regarding the use of this medicine in children. Special care may be needed. Overdosage: If you think you have taken too much of this medicine contact a poison control center or emergency room at once. NOTE: This medicine is only for you. Do not share this medicine with others. What if I miss a dose? If you miss a dose, take it as soon as you can. If it is almost  time for your next dose, take only that dose. Do not take double or extra doses. What may interact with this medicine? Do not take this medicine with any of the following medications: -certain contrast medicines given before X-rays, CT scans, MRI, or other procedures -dofetilide This medicine may also interact with the following medications: -acetazolamide -alcohol -certain antivirals for HIV or hepatitis -certain medicines for blood pressure, heart disease, irregular heart beat -cimetidine -dichlorphenamide -digoxin -diuretics -male hormones, like estrogens or progestins and birth control pills -glycopyrrolate -isoniazid -lamotrigine -memantine -methazolamide -metoclopramide -midodrine -niacin -phenothiazines like chlorpromazine, mesoridazine, prochlorperazine, thioridazine -phenytoin -ranolazine -steroid medicines like prednisone or cortisone -stimulant medicines for attention disorders, weight loss, or to stay awake -thyroid medicines -topiramate -trospium -vandetanib -zonisamide This list may not describe all possible interactions. Give your health care provider a list of all the medicines, herbs, non-prescription drugs, or dietary supplements you use. Also tell them if you smoke, drink alcohol, or use illegal drugs. Some items may interact with your medicine. What should I watch for while using this medicine? Visit your doctor or health care professional for regular checks on your progress. A test called the HbA1C (A1C) will be monitored. This is a simple blood test. It measures your blood sugar control over the last 2 to 3 months. You will receive this test every 3 to 6 months. Learn how to check your blood sugar. Learn the symptoms of low and high blood sugar and how to manage them. Always carry a quick-source of sugar with you in case you have symptoms of low blood sugar. Examples  include hard sugar candy or glucose tablets. Make sure others know that you can choke if  you eat or drink when you develop serious symptoms of low blood sugar, such as seizures or unconsciousness. They must get medical help at once. Tell your doctor or health care professional if you have high blood sugar. You might need to change the dose of your medicine. If you are sick or exercising more than usual, you might need to change the dose of your medicine. Do not skip meals. Ask your doctor or health care professional if you should avoid alcohol. Many nonprescription cough and cold products contain sugar or alcohol. These can affect blood sugar. This medicine may cause ovulation in premenopausal women who do not have regular monthly periods. This may increase your chances of becoming pregnant. You should not take this medicine if you become pregnant or think you may be pregnant. Talk with your doctor or health care professional about your birth control options while taking this medicine. Contact your doctor or health care professional right away if you think you are pregnant. The tablet shell for some brands of this medicine does not dissolve. This is normal. The tablet shell may appear whole in the stool. This is not a cause for concern. If you are going to need surgery, a MRI, CT scan, or other procedure, tell your doctor that you are taking this medicine. You may need to stop taking this medicine before the procedure. Wear a medical ID bracelet or chain, and carry a card that describes your disease and details of your medicine and dosage times. This medicine may cause a decrease in folic acid and vitamin B12. You should make sure that you get enough vitamins while you are taking this medicine. Discuss the foods you eat and the vitamins you take with your health care professional. What side effects may I notice from receiving this medicine? Side effects that you should report to your doctor or health care professional as soon as possible: -allergic reactions like skin rash, itching or hives,  swelling of the face, lips, or tongue -breathing problems -feeling faint or lightheaded, falls -muscle aches or pains -signs and symptoms of low blood sugar such as feeling anxious, confusion, dizziness, increased hunger, unusually weak or tired, sweating, shakiness, cold, irritable, headache, blurred vision, fast heartbeat, loss of consciousness -slow or irregular heartbeat -unusual stomach pain or discomfort -unusually tired or weak Side effects that usually do not require medical attention (report to your doctor or health care professional if they continue or are bothersome): -diarrhea -headache -heartburn -metallic taste in mouth -nausea -stomach gas, upset This list may not describe all possible side effects. Call your doctor for medical advice about side effects. You may report side effects to FDA at 1-800-FDA-1088. Where should I keep my medicine? Keep out of the reach of children. Store at room temperature between 15 and 30 degrees C (59 and 86 degrees F). Protect from light. Throw away any unused medicine after the expiration date. NOTE: This sheet is a summary. It may not cover all possible information. If you have questions about this medicine, talk to your doctor, pharmacist, or health care provider.  2019 Elsevier/Gold Standard (2017-05-20 18:58:32)

## 2018-09-08 NOTE — Progress Notes (Signed)
Signed          Patient was given Testosterone Cypionate 200 mg/ml 2 ml injection inrightupper outer quadrant IM. Patient tolerated well.

## 2018-09-11 ENCOUNTER — Other Ambulatory Visit: Payer: Self-pay | Admitting: Internal Medicine

## 2018-09-11 DIAGNOSIS — E119 Type 2 diabetes mellitus without complications: Secondary | ICD-10-CM

## 2018-09-11 MED ORDER — METFORMIN HCL ER 500 MG PO TB24: ORAL_TABLET | ORAL | 1 refills | Status: DC

## 2018-09-14 ENCOUNTER — Other Ambulatory Visit: Payer: Self-pay

## 2018-09-14 DIAGNOSIS — E119 Type 2 diabetes mellitus without complications: Secondary | ICD-10-CM

## 2018-09-14 MED ORDER — METFORMIN HCL ER 500 MG PO TB24: ORAL_TABLET | ORAL | 1 refills | Status: DC

## 2018-09-14 NOTE — Progress Notes (Signed)
Pharmacy states they never received current rx, please resend.

## 2018-09-20 ENCOUNTER — Other Ambulatory Visit: Payer: Self-pay | Admitting: Internal Medicine

## 2018-09-20 DIAGNOSIS — E119 Type 2 diabetes mellitus without complications: Secondary | ICD-10-CM

## 2018-09-20 MED ORDER — METFORMIN HCL ER 500 MG PO TB24: ORAL_TABLET | ORAL | 1 refills | Status: DC

## 2018-09-22 ENCOUNTER — Ambulatory Visit: Payer: BLUE CROSS/BLUE SHIELD

## 2018-09-22 ENCOUNTER — Other Ambulatory Visit: Payer: Self-pay

## 2018-09-22 VITALS — BP 122/86 | HR 97 | Temp 97.9°F | Ht 69.0 in | Wt 232.0 lb

## 2018-09-22 DIAGNOSIS — Z79899 Other long term (current) drug therapy: Secondary | ICD-10-CM | POA: Diagnosis not present

## 2018-09-22 DIAGNOSIS — E349 Endocrine disorder, unspecified: Secondary | ICD-10-CM | POA: Diagnosis not present

## 2018-09-22 DIAGNOSIS — D72829 Elevated white blood cell count, unspecified: Secondary | ICD-10-CM

## 2018-09-22 DIAGNOSIS — I10 Essential (primary) hypertension: Secondary | ICD-10-CM | POA: Diagnosis not present

## 2018-09-22 MED ORDER — TESTOSTERONE CYPIONATE 200 MG/ML IM SOLN
200.0000 mg | INTRAMUSCULAR | Status: DC
  Administered 2018-09-22: 200 mg via INTRAMUSCULAR

## 2018-09-22 NOTE — Addendum Note (Signed)
Addended by: Emerson Monte on: 09/22/2018 10:25 AM   Modules accepted: Orders

## 2018-09-22 NOTE — Progress Notes (Signed)
Patient is here for Testosterone Cypionate 200 mg/ml 2 ml injection in RIGHTupper outer quadrant IM. Patient tolerated well.  VITALS were entered into EPIC.  Patient also had bld LAB work done which were already entered into epic as well by provider.

## 2018-09-23 ENCOUNTER — Other Ambulatory Visit: Payer: Self-pay | Admitting: Adult Health

## 2018-09-23 DIAGNOSIS — E349 Endocrine disorder, unspecified: Secondary | ICD-10-CM

## 2018-09-23 LAB — CBC WITH DIFFERENTIAL/PLATELET
Absolute Monocytes: 1021 cells/uL — ABNORMAL HIGH (ref 200–950)
Basophils Absolute: 86 cells/uL (ref 0–200)
Basophils Relative: 0.7 %
Eosinophils Absolute: 627 cells/uL — ABNORMAL HIGH (ref 15–500)
Eosinophils Relative: 5.1 %
HCT: 51.5 % — ABNORMAL HIGH (ref 38.5–50.0)
Hemoglobin: 18.1 g/dL — ABNORMAL HIGH (ref 13.2–17.1)
Lymphs Abs: 2239 cells/uL (ref 850–3900)
MCH: 32 pg (ref 27.0–33.0)
MCHC: 35.1 g/dL (ref 32.0–36.0)
MCV: 91.2 fL (ref 80.0–100.0)
MPV: 9.2 fL (ref 7.5–12.5)
Monocytes Relative: 8.3 %
Neutro Abs: 8327 cells/uL — ABNORMAL HIGH (ref 1500–7800)
Neutrophils Relative %: 67.7 %
Platelets: 503 10*3/uL — ABNORMAL HIGH (ref 140–400)
RBC: 5.65 10*6/uL (ref 4.20–5.80)
RDW: 12.8 % (ref 11.0–15.0)
Total Lymphocyte: 18.2 %
WBC: 12.3 10*3/uL — ABNORMAL HIGH (ref 3.8–10.8)

## 2018-09-23 MED ORDER — TESTOSTERONE CYPIONATE 200 MG/ML IM SOLN: INTRAMUSCULAR | 2 refills | Status: DC

## 2018-10-03 ENCOUNTER — Other Ambulatory Visit: Payer: Self-pay

## 2018-10-03 ENCOUNTER — Other Ambulatory Visit: Payer: Self-pay | Admitting: Adult Health

## 2018-10-03 DIAGNOSIS — Z9081 Acquired absence of spleen: Secondary | ICD-10-CM

## 2018-10-03 DIAGNOSIS — D580 Hereditary spherocytosis: Secondary | ICD-10-CM

## 2018-10-03 DIAGNOSIS — E349 Endocrine disorder, unspecified: Secondary | ICD-10-CM

## 2018-10-03 DIAGNOSIS — D751 Secondary polycythemia: Secondary | ICD-10-CM

## 2018-10-03 MED ORDER — TESTOSTERONE CYPIONATE 200 MG/ML IM SOLN: 100.0000 mg | INTRAMUSCULAR | 2 refills | Status: DC

## 2018-10-03 NOTE — Progress Notes (Signed)
Based on last labs, patient is to administer 26ml instead of 75ml every two weeks.

## 2018-10-06 ENCOUNTER — Ambulatory Visit: Payer: BC Managed Care – PPO

## 2018-10-06 ENCOUNTER — Other Ambulatory Visit: Payer: Self-pay

## 2018-10-06 VITALS — BP 126/80 | HR 88 | Temp 97.5°F | Wt 229.0 lb

## 2018-10-06 DIAGNOSIS — E349 Endocrine disorder, unspecified: Secondary | ICD-10-CM

## 2018-10-06 MED ORDER — TESTOSTERONE CYPIONATE 100 MG/ML IM SOLN: 100.0000 mg | INTRAMUSCULAR | Status: DC

## 2018-10-06 MED ORDER — TESTOSTERONE CYPIONATE 200 MG/ML IM SOLN
200.0000 mg | Freq: Once | INTRAMUSCULAR | Status: AC
  Administered 2018-10-06: 200 mg via INTRAMUSCULAR

## 2018-10-06 NOTE — Progress Notes (Signed)
Patient is here for Testosterone Cypionate100mg/ml 1ml injection in LEFTupper outer quadrant IM. Patient tolerated well.Vitals taken and recorded. 

## 2018-10-10 ENCOUNTER — Telehealth: Payer: Self-pay | Admitting: Hematology & Oncology

## 2018-10-10 NOTE — Telephone Encounter (Signed)
lmom to inform patient of 6/30 appt at 1030 am with Dr Marin Olp

## 2018-10-20 ENCOUNTER — Other Ambulatory Visit: Payer: Self-pay

## 2018-10-20 ENCOUNTER — Ambulatory Visit: Payer: BC Managed Care – PPO

## 2018-10-20 DIAGNOSIS — Z79899 Other long term (current) drug therapy: Secondary | ICD-10-CM

## 2018-10-20 DIAGNOSIS — E349 Endocrine disorder, unspecified: Secondary | ICD-10-CM | POA: Diagnosis not present

## 2018-10-20 MED ORDER — TESTOSTERONE CYPIONATE 200 MG/ML IM SOLN
200.0000 mg | INTRAMUSCULAR | Status: DC
  Administered 2018-10-20: 200 mg via INTRAMUSCULAR

## 2018-10-20 NOTE — Addendum Note (Signed)
Addended by: Elsie Amis D on: 10/20/2018 10:10 AM   Modules accepted: Orders

## 2018-10-20 NOTE — Progress Notes (Signed)
Patient is here for Testosterone Cypionate100mg/ml 1ml injection inRIGHTupper outer quadrant IM. Patient tolerated well.Vitals taken and recorded. 

## 2018-10-25 ENCOUNTER — Inpatient Hospital Stay: Payer: BC Managed Care – PPO | Attending: Hematology & Oncology | Admitting: Hematology & Oncology

## 2018-10-25 ENCOUNTER — Inpatient Hospital Stay: Payer: BC Managed Care – PPO

## 2018-10-25 ENCOUNTER — Encounter: Payer: Self-pay | Admitting: Hematology & Oncology

## 2018-10-25 ENCOUNTER — Other Ambulatory Visit: Payer: Self-pay

## 2018-10-25 VITALS — BP 114/85 | HR 111 | Temp 97.5°F | Resp 18 | Wt 221.5 lb

## 2018-10-25 DIAGNOSIS — Z7189 Other specified counseling: Secondary | ICD-10-CM

## 2018-10-25 DIAGNOSIS — D751 Secondary polycythemia: Secondary | ICD-10-CM | POA: Diagnosis not present

## 2018-10-25 DIAGNOSIS — Z7984 Long term (current) use of oral hypoglycemic drugs: Secondary | ICD-10-CM | POA: Diagnosis not present

## 2018-10-25 DIAGNOSIS — I10 Essential (primary) hypertension: Secondary | ICD-10-CM | POA: Diagnosis not present

## 2018-10-25 DIAGNOSIS — Z9081 Acquired absence of spleen: Secondary | ICD-10-CM | POA: Diagnosis not present

## 2018-10-25 DIAGNOSIS — D580 Hereditary spherocytosis: Secondary | ICD-10-CM | POA: Diagnosis not present

## 2018-10-25 DIAGNOSIS — Z7982 Long term (current) use of aspirin: Secondary | ICD-10-CM | POA: Diagnosis not present

## 2018-10-25 DIAGNOSIS — E785 Hyperlipidemia, unspecified: Secondary | ICD-10-CM | POA: Diagnosis not present

## 2018-10-25 DIAGNOSIS — Z79899 Other long term (current) drug therapy: Secondary | ICD-10-CM

## 2018-10-25 DIAGNOSIS — E349 Endocrine disorder, unspecified: Secondary | ICD-10-CM

## 2018-10-25 HISTORY — DX: Secondary polycythemia: D75.1

## 2018-10-25 HISTORY — DX: Endocrine disorder, unspecified: E34.9

## 2018-10-25 LAB — CMP (CANCER CENTER ONLY)
ALT: 29 U/L (ref 0–44)
AST: 25 U/L (ref 15–41)
Albumin: 4.5 g/dL (ref 3.5–5.0)
Alkaline Phosphatase: 42 U/L (ref 38–126)
Anion gap: 9 (ref 5–15)
BUN: 12 mg/dL (ref 6–20)
CO2: 26 mmol/L (ref 22–32)
Calcium: 9.9 mg/dL (ref 8.9–10.3)
Chloride: 100 mmol/L (ref 98–111)
Creatinine: 1.14 mg/dL (ref 0.61–1.24)
GFR, Est AFR Am: >60 mL/min (ref >60)
GFR, Estimated: >60 mL/min (ref >60)
Glucose, Bld: 179 mg/dL — ABNORMAL HIGH (ref 70–99)
Potassium: 4.3 mmol/L (ref 3.5–5.1)
Sodium: 135 mmol/L (ref 135–145)
Total Bilirubin: 1.4 mg/dL — ABNORMAL HIGH (ref 0.3–1.2)
Total Protein: 7 g/dL (ref 6.5–8.1)

## 2018-10-25 LAB — CBC WITH DIFFERENTIAL (CANCER CENTER ONLY)
Abs Immature Granulocytes: 0.07 10*3/uL (ref 0.00–0.07)
Basophils Absolute: 0.2 10*3/uL — ABNORMAL HIGH (ref 0.0–0.1)
Basophils Relative: 1 %
Eosinophils Absolute: 0.3 10*3/uL (ref 0.0–0.5)
Eosinophils Relative: 2 %
HCT: 50.4 % (ref 39.0–52.0)
Hemoglobin: 17.7 g/dL — ABNORMAL HIGH (ref 13.0–17.0)
Immature Granulocytes: 1 %
Lymphocytes Relative: 19 %
Lymphs Abs: 2.7 10*3/uL (ref 0.7–4.0)
MCH: 32 pg (ref 26.0–34.0)
MCHC: 35.1 g/dL (ref 30.0–36.0)
MCV: 91.1 fL (ref 80.0–100.0)
Monocytes Absolute: 1.1 10*3/uL — ABNORMAL HIGH (ref 0.1–1.0)
Monocytes Relative: 8 %
Neutro Abs: 9.7 10*3/uL — ABNORMAL HIGH (ref 1.7–7.7)
Neutrophils Relative %: 69 %
Platelet Count: 541 10*3/uL — ABNORMAL HIGH (ref 150–400)
RBC: 5.53 MIL/uL (ref 4.22–5.81)
RDW: 12.1 % (ref 11.5–15.5)
WBC Count: 14 10*3/uL — ABNORMAL HIGH (ref 4.0–10.5)
nRBC: 0 % (ref 0.0–0.2)

## 2018-10-25 LAB — RETIC PANEL
Immature Retic Fract: 4.7 % (ref 2.3–15.9)
RBC.: 5.05 MIL/uL (ref 4.22–5.81)
Retic Count, Absolute: 145.5 10*3/uL (ref 19.0–186.0)
Retic Ct Pct: 2.9 % (ref 0.4–3.1)
Reticulocyte Hemoglobin: 33.3 pg (ref >27.9)

## 2018-10-25 LAB — SAVE SMEAR(SSMR), FOR PROVIDER SLIDE REVIEW

## 2018-10-25 NOTE — Progress Notes (Signed)
Referral MD  Reason for Referral: Erythrocytosis, leukocytosis, thrombocytosis; status post splenectomy for hereditary spherocytosis  No chief complaint on file. : I am not sure why I am here.  HPI: Noah Dennis is a very nice 48 year old white male.  He obviously is quite busy.  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  He is very busy right now.  He really does not have a lot of time to go see doctors.  He is followed by Dr. Oneta RackMcKeown.  As always, Dr. Oneta RackMcKeown is a very thorough and extensive evaluation of any issue.  He has noticed that Noah Dennis has had a high white cell count, red cell count and platelet count for a year or so.  Of note, Noah Dennis has hereditary spherocytosis.  He said he had a spleen taken out when he was 48 years old.  2 years ago, he said he was put on supplemental testosterone.  Going back to 2017, he has had an element of pan hyperplasia.  Back in August 2017, his white cell count was 12.5.  Hemoglobin 17.1.  White count 470,000.  He is always had a normal white blood cell differential.  He does not have any issues with pain in his hands or feet.  He does have a little bit of a ruddy complexion.  He has had no headache.  He is on baby aspirin.  He says the testosterone makes him feel a lot better.  I am sure that this is the case.  I am also sure that this is the main etiology for the erythrocytosis.  He has had no history of tobacco use.  He does have alcohol on occasion.  He has had no change in bowel or bladder habits.  He has had no fever.  He said his mother had hereditary spherocytosis.  [facility] was kindly [facility] for [facility].     Past Medical History:  Diagnosis Date  . Hyperlipidemia   . Hypertension   . Hypogonadism male   . Insulin resistance   . Pre-diabetes   :  Past Surgical History:  Procedure Laterality Date  . SPLENECTOMY    :   Current Outpatient Medications:  .  Cholecalciferol  (VITAMIN D-3) 5000 units TABS, Take 5,000 Units by mouth 2 (two) times daily., Disp: , Rfl:  .  doxycycline (VIBRAMYCIN) 100 MG capsule, TAKE 1 CAPSULE BY MOUTH TWICE DAILY FOR ROSACEA, Disp: 180 capsule, Rfl: 1 .  finasteride (PROSCAR) 5 MG tablet, TAKE 1 TABLET (5 MG TOTAL) BY MOUTH DAILY., Disp: 90 tablet, Rfl: 1 .  levofloxacin (LEVAQUIN) 500 MG tablet, Take 1 tablet daily with food for infection, Disp: 15 tablet, Rfl: 1 .  lisinopril (PRINIVIL,ZESTRIL) 20 MG tablet, TAKE 1 TABLET BY MOUTH EVERY DAY, Disp: 90 tablet, Rfl: 1 .  metFORMIN (GLUCOPHAGE-XR) 500 MG 24 hr tablet, Take 2 tablets 2 x /day with Meals for Diabetes, Disp: 360 tablet, Rfl: 1 .  phentermine (ADIPEX-P) 37.5 MG tablet, Take 1 tablet (37.5 mg total) by mouth daily before breakfast., Disp: 30 tablet, Rfl: 2 .  rosuvastatin (CRESTOR) 40 MG tablet, TAKE 1 TABLET BY MOUTH EVERY DAY, Disp: 90 tablet, Rfl: 1 .  tadalafil (CIALIS) 20 MG tablet, 1/2-1 tablet as needed for erectile dysfunction, Disp: 10 tablet, Rfl: 0 .  terbinafine (LAMISIL) 250 MG tablet, TAKE 1 TABLET BY MOUTH EVERY DAY, Disp: 90 tablet, Rfl: 0 .  testosterone cypionate (DEPOTESTOSTERONE CYPIONATE) 200 MG/ML injection, Inject 0.5  mLs (100 mg total) into the muscle every 14 (fourteen) days. Inject 1 ml into the muscle every 2 weeks., Disp: 10 mL, Rfl: 2  Current Facility-Administered Medications:  .  testosterone cypionate (DEPOTESTOSTERONE CYPIONATE) injection 200 mg, 200 mg, Intramuscular, Q14 Days, Quentin MullingCollier, Amanda, PA-C, 200 mg at 10/20/18 1009:  . testosterone cypionate  200 mg Intramuscular Q14 Days  :  No Known Allergies:  Family History  Problem Relation Age of Onset  . Diabetes Mother   . Hypertension Mother   . Heart disease Mother   . Hypertension Father   . Cancer Father   :  Social History   Socioeconomic History  . Marital status: Single    Spouse name: Not on file  . Number of children: Not on file  . Years of education: Not on file  .  Highest education level: Not on file  Occupational History  . Not on file  Social Needs  . Financial resource strain: Not on file  . Food insecurity    Worry: Not on file    Inability: Not on file  . Transportation needs    Medical: Not on file    Non-medical: Not on file  Tobacco Use  . Smoking status: Never Smoker  . Smokeless tobacco: Never Used  Substance and Sexual Activity  . Alcohol use: Yes    Comment: occasionally  . Drug use: No  . Sexual activity: Not on file  Lifestyle  . Physical activity    Days per week: Not on file    Minutes per session: Not on file  . Stress: Not on file  Relationships  . Social Musicianconnections    Talks on phone: Not on file    Gets together: Not on file    Attends religious service: Not on file    Active member of club or organization: Not on file    Attends meetings of clubs or organizations: Not on file    Relationship status: Not on file  . Intimate partner violence    Fear of current or ex partner: Not on file    Emotionally abused: Not on file    Physically abused: Not on file    Forced sexual activity: Not on file  Other Topics Concern  . Not on file  Social History Narrative  . Not on file  :  Review of Systems  Constitutional: Negative.   HENT: Negative.   Eyes: Negative.   Respiratory: Negative.   Cardiovascular: Negative.   Gastrointestinal: Negative.   Genitourinary: Negative.   Musculoskeletal: Negative.   Skin: Negative.   Neurological: Negative.   Endo/Heme/Allergies: Negative.   Psychiatric/Behavioral: Negative.      Exam: Well-developed and well-nourished white male in no obvious distress.  Vital signs are temperature 97.5.  Pulse 111.  Blood pressure 114/85.  Weight is 221 pounds.  Head neck exam shows no ocular or oral lesions.  He has no scleral icterus.  He has no conjunctival inflammation.  He does have a little bit of facial plethora.  There is no adenopathy in the neck.  Thyroid is nonpalpable.  Lungs  are clear bilaterally.  Cardiac exam regular rate and rhythm with no murmurs, rubs or bruits.  Abdomen is soft.  He has good bowel sounds.  He is mildly obese.  He has no fluid wave.  There is no palpable liver edge.  He does have laparotomy scar from the splenectomy in the left upper quadrant.  Exam shows no tenderness over the spine,  ribs or hips.  Extremities shows no clubbing, cyanosis or edema.  Skin exam shows some ruddy complexion on his arms.  Neurological exam is nonfocal. @IPVITALS @   Recent Labs    10/25/18 1048  WBC 14.0*  HGB 17.7*  HCT 50.4  PLT 541*   Recent Labs    10/25/18 1040  NA 135  K 4.3  CL 100  CO2 26  GLUCOSE 179*  BUN 12  CREATININE 1.14  CALCIUM 9.9    Blood smear review: Normochromic and normocytic population of red blood cells.  There is no anisocytosis or poikilocytosis.  He has no nucleated red blood cells.  There are no teardrop cells.  There is no rouleaux formation.  White blood cells are increased in number.  White blood cells appear mature.  He has no hypersegmented polys.  There are no immature myeloid or lymphoid cells.  Platelets are also increased in number.  He has a few large platelets.  Platelets are well granulated.  Pathology: None    Assessment and Plan: Noah Dennis is a very nice 48 year old white male.  I think he has 2 separate issues.  I think that the erythrocytosis is cleared from his testosterone supplementation.  He gets this every couple weeks.  He says he needs this.  As such, he will have erythrocytosis from androgen supplementation.  He has the splenectomy from the history of spherocytosis.  Because of this, this probably exacerbates the erythrocytosis.  It is no surprise that he has leukocytosis and thrombocytosis.  I really do not think that he has polycythemia vera.  However, I am sending off a panel to check for Jak 2 mutation.  We will see what his erythropoietin level is.  I am also checking his iron levels.  He will  need to be phlebotomized.  We would like to keep his hematocrit below 45% if we can.  I spent about 45 minutes with Noah Dennis.  He is a very interesting man.  He has a very important job.  He is quite busy which makes him happy.  We will see about setting of a phlebotomy program for him.  We will set him up next week to have a phlebotomy.  I suspect that he probably will need phlebotomies in the future.  He will was be on testosterone.  Maybe, if we do get him iron deficient, the amount of phlebotomies will decrease.  I would like to see him back myself in about 4 weeks or so.  I answered all of his questions.

## 2018-10-26 ENCOUNTER — Telehealth: Payer: Self-pay | Admitting: *Deleted

## 2018-10-26 LAB — HEMOGLOBINOPATHY EVALUATION
Hgb A2 Quant: 2.1 % (ref 1.8–3.2)
Hgb A: 97.9 % (ref 96.4–98.8)
Hgb C: 0 %
Hgb F Quant: 0 % (ref 0.0–2.0)
Hgb S Quant: 0 %
Hgb Variant: 0 %

## 2018-10-26 LAB — FERRITIN: Ferritin: 133 ng/mL (ref 24–336)

## 2018-10-26 LAB — IRON AND TIBC
Iron: 119 ug/dL (ref 42–163)
Saturation Ratios: 34 % (ref 20–55)
TIBC: 349 ug/dL (ref 202–409)
UIBC: 229 ug/dL (ref 117–376)

## 2018-10-26 LAB — TESTOSTERONE: Testosterone: 673 ng/dL (ref 264–916)

## 2018-10-26 LAB — ERYTHROPOIETIN: Erythropoietin: 20.3 m[IU]/mL — ABNORMAL HIGH (ref 2.6–18.5)

## 2018-10-26 NOTE — Telephone Encounter (Addendum)
Patient is aware of results.   ----- Message from Volanda Napoleon, MD sent at 10/26/2018  7:17 AM EDT ----- Call - the testosterone level is quite good at 673!!  The dose of testosterone that you are getting is perfect!!  Laurey Arrow

## 2018-10-31 ENCOUNTER — Other Ambulatory Visit: Payer: Self-pay | Admitting: Internal Medicine

## 2018-10-31 DIAGNOSIS — E349 Endocrine disorder, unspecified: Secondary | ICD-10-CM

## 2018-11-03 ENCOUNTER — Other Ambulatory Visit: Payer: Self-pay

## 2018-11-03 ENCOUNTER — Ambulatory Visit: Payer: BC Managed Care – PPO

## 2018-11-03 VITALS — BP 128/84 | HR 101 | Temp 97.8°F | Wt 220.0 lb

## 2018-11-03 DIAGNOSIS — E291 Testicular hypofunction: Secondary | ICD-10-CM

## 2018-11-03 DIAGNOSIS — E349 Endocrine disorder, unspecified: Secondary | ICD-10-CM

## 2018-11-03 DIAGNOSIS — Z79899 Other long term (current) drug therapy: Secondary | ICD-10-CM

## 2018-11-03 NOTE — Progress Notes (Signed)
Patient is here for Testosterone Cypionate100mg /ml 69ml injection in LEFTupper outer quadrant IM. Patient tolerated well.Vitals taken and recorded.

## 2018-11-04 ENCOUNTER — Inpatient Hospital Stay: Payer: BC Managed Care – PPO | Attending: Hematology & Oncology

## 2018-11-04 DIAGNOSIS — D751 Secondary polycythemia: Secondary | ICD-10-CM | POA: Diagnosis not present

## 2018-11-04 NOTE — Patient Instructions (Signed)

## 2018-11-11 LAB — JAK2 (INCLUDING V617F AND EXON 12), MPL,& CALR-NEXT GEN SEQ

## 2018-11-17 ENCOUNTER — Ambulatory Visit: Payer: BC Managed Care – PPO

## 2018-11-17 ENCOUNTER — Other Ambulatory Visit: Payer: Self-pay

## 2018-11-17 VITALS — BP 132/84 | HR 111 | Temp 97.5°F | Wt 218.0 lb

## 2018-11-17 DIAGNOSIS — E349 Endocrine disorder, unspecified: Secondary | ICD-10-CM | POA: Diagnosis not present

## 2018-11-17 MED ORDER — TESTOSTERONE CYPIONATE 200 MG/ML IM SOLN
200.0000 mg | Freq: Once | INTRAMUSCULAR | Status: AC
  Administered 2018-11-17: 100 mg via INTRAMUSCULAR

## 2018-11-17 MED ORDER — TESTOSTERONE CYPIONATE 100 MG/ML IM SOLN: 100.0000 mg | Freq: Once | INTRAMUSCULAR | Status: DC

## 2018-11-17 NOTE — Progress Notes (Signed)
Patient is here for Testosterone Cypionate100mg /ml 81ml injection inRIGHTupper outer quadrant IM. Patient tolerated well.Vitals taken and recorded.

## 2018-11-25 ENCOUNTER — Ambulatory Visit: Payer: BC Managed Care – PPO | Admitting: Hematology & Oncology

## 2018-11-25 ENCOUNTER — Other Ambulatory Visit: Payer: BC Managed Care – PPO

## 2018-11-30 ENCOUNTER — Inpatient Hospital Stay (HOSPITAL_BASED_OUTPATIENT_CLINIC_OR_DEPARTMENT_OTHER): Payer: BC Managed Care – PPO | Admitting: Family

## 2018-11-30 ENCOUNTER — Inpatient Hospital Stay: Payer: BC Managed Care – PPO | Attending: Hematology & Oncology

## 2018-11-30 ENCOUNTER — Encounter: Payer: Self-pay | Admitting: Family

## 2018-11-30 ENCOUNTER — Inpatient Hospital Stay: Payer: BC Managed Care – PPO

## 2018-11-30 ENCOUNTER — Other Ambulatory Visit: Payer: Self-pay

## 2018-11-30 VITALS — BP 116/72 | HR 95 | Temp 98.7°F | Resp 20 | Wt 215.1 lb

## 2018-11-30 DIAGNOSIS — Z7982 Long term (current) use of aspirin: Secondary | ICD-10-CM | POA: Insufficient documentation

## 2018-11-30 DIAGNOSIS — Z79899 Other long term (current) drug therapy: Secondary | ICD-10-CM | POA: Diagnosis not present

## 2018-11-30 DIAGNOSIS — Z7189 Other specified counseling: Secondary | ICD-10-CM

## 2018-11-30 DIAGNOSIS — D580 Hereditary spherocytosis: Secondary | ICD-10-CM

## 2018-11-30 DIAGNOSIS — E349 Endocrine disorder, unspecified: Secondary | ICD-10-CM

## 2018-11-30 DIAGNOSIS — D751 Secondary polycythemia: Secondary | ICD-10-CM

## 2018-11-30 DIAGNOSIS — T387X5A Adverse effect of androgens and anabolic congeners, initial encounter: Secondary | ICD-10-CM | POA: Insufficient documentation

## 2018-11-30 LAB — CBC WITH DIFFERENTIAL (CANCER CENTER ONLY)
Abs Immature Granulocytes: 0.05 10*3/uL (ref 0.00–0.07)
Basophils Absolute: 0.1 10*3/uL (ref 0.0–0.1)
Basophils Relative: 1 %
Eosinophils Absolute: 0.5 10*3/uL (ref 0.0–0.5)
Eosinophils Relative: 3 %
HCT: 47.5 % (ref 39.0–52.0)
Hemoglobin: 16.8 g/dL (ref 13.0–17.0)
Immature Granulocytes: 0 %
Lymphocytes Relative: 22 %
Lymphs Abs: 3.2 10*3/uL (ref 0.7–4.0)
MCH: 32.7 pg (ref 26.0–34.0)
MCHC: 35.4 g/dL (ref 30.0–36.0)
MCV: 92.6 fL (ref 80.0–100.0)
Monocytes Absolute: 1.5 10*3/uL — ABNORMAL HIGH (ref 0.1–1.0)
Monocytes Relative: 10 %
Neutro Abs: 9.5 10*3/uL — ABNORMAL HIGH (ref 1.7–7.7)
Neutrophils Relative %: 64 %
Platelet Count: 530 10*3/uL — ABNORMAL HIGH (ref 150–400)
RBC: 5.13 MIL/uL (ref 4.22–5.81)
RDW: 12.1 % (ref 11.5–15.5)
WBC Count: 14.8 10*3/uL — ABNORMAL HIGH (ref 4.0–10.5)
nRBC: 0 % (ref 0.0–0.2)

## 2018-11-30 LAB — CMP (CANCER CENTER ONLY)
ALT: 16 U/L (ref 0–44)
AST: 16 U/L (ref 15–41)
Albumin: 4.1 g/dL (ref 3.5–5.0)
Alkaline Phosphatase: 45 U/L (ref 38–126)
Anion gap: 6 (ref 5–15)
BUN: 9 mg/dL (ref 6–20)
CO2: 27 mmol/L (ref 22–32)
Calcium: 9.1 mg/dL (ref 8.9–10.3)
Chloride: 104 mmol/L (ref 98–111)
Creatinine: 1.04 mg/dL (ref 0.61–1.24)
GFR, Est AFR Am: >60 mL/min (ref >60)
GFR, Estimated: >60 mL/min (ref >60)
Glucose, Bld: 116 mg/dL — ABNORMAL HIGH (ref 70–99)
Potassium: 4.3 mmol/L (ref 3.5–5.1)
Sodium: 137 mmol/L (ref 135–145)
Total Bilirubin: 1.1 mg/dL (ref 0.3–1.2)
Total Protein: 6.4 g/dL — ABNORMAL LOW (ref 6.5–8.1)

## 2018-11-30 NOTE — Progress Notes (Signed)
Hematology and Oncology Follow Up Visit  Rolf Fells [id number] 1970/08/14 48 y.o. 11/30/2018   Principle Diagnosis:  Hereditary spherocytosis with splenectomy  Erythrocytosis secondary to testosterone supplementation   Current Therapy:   Phlebotomy to maintain Hgb < 45% Aspirin 81 mg PO daily   Interim History:  Mr. Trueba is here today for follow-up. He is doing well and has no complaints at this time.  He states that his PCP has reduced his testosterone dose to 1 ml IM every 2 weeks.  His Hct is now down to 47.5%.  He had one phlebotomy in July and tolerated this well.  He is taking his baby aspirin daily as prescribed.  No bruising or petechiae.  No fever, chills, n/v, cough, rash, dizziness, SOB, chest pain, palpitations, abdominal pain or changes in bowel or bladder habits.  No swelling, tenderness, numbness or tingling in his extremities.  He has maintained a good appetite and is staying well hydrated. His weight is stable.   ECOG Performance Status: 1 - Symptomatic but completely ambulatory  Medications:  Allergies as of 11/30/2018   No Known Allergies     Medication List       Accurate as of November 30, 2018  3:18 PM. If you have any questions, ask your nurse or doctor.        doxycycline 100 MG capsule Commonly known as: VIBRAMYCIN TAKE 1 CAPSULE BY MOUTH TWICE DAILY FOR ROSACEA   finasteride 5 MG tablet Commonly known as: PROSCAR TAKE 1 TABLET (5 MG TOTAL) BY MOUTH DAILY.   levofloxacin 500 MG tablet Commonly known as: Levaquin Take 1 tablet daily with food for infection   lisinopril 20 MG tablet Commonly known as: ZESTRIL TAKE 1 TABLET BY MOUTH EVERY DAY   metFORMIN 500 MG 24 hr tablet Commonly known as: GLUCOPHAGE-XR Take 2 tablets 2 x /day with Meals for Diabetes   phentermine 37.5 MG tablet Commonly known as: ADIPEX-P Take 1 tablet (37.5 mg total) by mouth daily before breakfast.   rosuvastatin 40 MG tablet Commonly known as: CRESTOR TAKE 1  TABLET BY MOUTH EVERY DAY   tadalafil 20 MG tablet Commonly known as: Cialis 1/2-1 tablet as needed for erectile dysfunction   terbinafine 250 MG tablet Commonly known as: LAMISIL TAKE 1 TABLET BY MOUTH EVERY DAY   testosterone cypionate 200 MG/ML injection Commonly known as: DEPOTESTOSTERONE CYPIONATE INJECT 2 ML INTO THE MUSCLE EVERY 2 WEEKS   Vitamin D-3 125 MCG (5000 UT) Tabs Take 5,000 Units by mouth 2 (two) times daily.       Allergies: No Known Allergies  Past Medical History, Surgical history, Social history, and Family History were reviewed and updated.  Review of Systems: All other 10 point review of systems is negative.   Physical Exam:  vitals were not taken for this visit.   Wt Readings from Last 3 Encounters:  11/17/18 218 lb (98.9 kg)  11/03/18 220 lb (99.8 kg)  10/25/18 221 lb 8 oz (100.5 kg)    Ocular: Sclerae unicteric, pupils equal, round and reactive to light Ear-nose-throat: Oropharynx clear, dentition fair Lymphatic: No cervical or supraclavicular adenopathy Lungs no rales or rhonchi, good excursion bilaterally Heart regular rate and rhythm, no murmur appreciated Abd soft, nontender, positive bowel sounds, no liver or spleen tip palpated on exam, no fluid wave  MSK no focal spinal tenderness, no joint edema Neuro: non-focal, well-oriented, appropriate affect Breasts: Deferred   Lab Results  Component Value Date   WBC 14.0 (H) 10/25/2018  HGB 17.7 (H) 10/25/2018   HCT 50.4 10/25/2018   MCV 91.1 10/25/2018   PLT 541 (H) 10/25/2018   Lab Results  Component Value Date   FERRITIN 133 10/25/2018   IRON 119 10/25/2018   TIBC 349 10/25/2018   UIBC 229 10/25/2018   IRONPCTSAT 34 10/25/2018   Lab Results  Component Value Date   RETICCTPCT 2.9 10/25/2018   RBC 5.05 10/25/2018   No results found for: KPAFRELGTCHN, LAMBDASER, KAPLAMBRATIO No results found for: IGGSERUM, IGA, IGMSERUM No results found for: Marda StalkerOTALPROTELP, ALBUMINELP, A1GS,  A2GS, BETS, BETA2SER, GAMS, MSPIKE, SPEI   Chemistry      Component Value Date/Time   NA 135 10/25/2018 1040   K 4.3 10/25/2018 1040   CL 100 10/25/2018 1040   CO2 26 10/25/2018 1040   BUN 12 10/25/2018 1040   CREATININE 1.14 10/25/2018 1040   CREATININE 1.10 08/18/2018 1506      Component Value Date/Time   CALCIUM 9.9 10/25/2018 1040   ALKPHOS 42 10/25/2018 1040   AST 25 10/25/2018 1040   ALT 29 10/25/2018 1040   BILITOT 1.4 (H) 10/25/2018 1040       Impression and Plan: Mr. Maple HudsonMoser is a pleasant 48 yo caucasian gentleman with erythrocytosis secondary to testosterone supplementation as well as hereditary spherocytosis.  With his phlebotomy and change to his testosterone dosage he seems to be doing well with a Hct of 47.5. I spoke with Dr. Myna HidalgoEnnever and we will hold of on phlebotomizing him for now.  He will continue to take his baby aspirin daily.  We will plan to see him back in another 3 months for follow-up.  He will contact our office with any questions or concerns. We can certainly see him sooner if needed.   Emeline GinsSarah , NP 8/5/20203:18 PM

## 2018-12-01 ENCOUNTER — Other Ambulatory Visit: Payer: Self-pay

## 2018-12-01 ENCOUNTER — Ambulatory Visit: Payer: BC Managed Care – PPO

## 2018-12-01 VITALS — BP 140/70 | HR 103 | Temp 97.5°F | Wt 214.0 lb

## 2018-12-01 DIAGNOSIS — E349 Endocrine disorder, unspecified: Secondary | ICD-10-CM | POA: Diagnosis not present

## 2018-12-01 LAB — IRON AND TIBC
Iron: 66 ug/dL (ref 42–163)
Saturation Ratios: 18 % — ABNORMAL LOW (ref 20–55)
TIBC: 360 ug/dL (ref 202–409)
UIBC: 294 ug/dL (ref 117–376)

## 2018-12-01 LAB — FERRITIN: Ferritin: 30 ng/mL (ref 24–336)

## 2018-12-01 MED ORDER — TESTOSTERONE CYPIONATE 200 MG/ML IM SOLN
100.0000 mg | Freq: Once | INTRAMUSCULAR | Status: AC
  Administered 2018-12-01: 100 mg via INTRAMUSCULAR

## 2018-12-01 NOTE — Progress Notes (Signed)
Patient is here for Testosterone Cypionate100mg/ml 1ml injection in LEFTupper outer quadrant IM. Patient tolerated well.Vitals taken and recorded. 

## 2018-12-15 ENCOUNTER — Ambulatory Visit: Payer: BC Managed Care – PPO

## 2018-12-15 ENCOUNTER — Other Ambulatory Visit: Payer: Self-pay

## 2018-12-15 VITALS — BP 128/78 | HR 104 | Temp 97.7°F | Wt 215.0 lb

## 2018-12-15 DIAGNOSIS — E349 Endocrine disorder, unspecified: Secondary | ICD-10-CM | POA: Diagnosis not present

## 2018-12-15 MED ORDER — TESTOSTERONE CYPIONATE 200 MG/ML IM SOLN
200.0000 mg | Freq: Once | INTRAMUSCULAR | Status: AC
  Administered 2018-12-15: 100 mg via INTRAMUSCULAR

## 2018-12-15 NOTE — Progress Notes (Signed)
Patient is here for Testosterone Cypionate100mg/ml 1ml injection inRIGHTupper outer quadrant IM. Patient tolerated well.Vitals taken and recorded. 

## 2018-12-26 ENCOUNTER — Encounter: Payer: Self-pay | Admitting: Adult Health

## 2018-12-26 NOTE — Progress Notes (Signed)
FOLLOW UP  Assessment and Plan:   Hypertension Well controlled with current medications  Monitor blood pressure at home; patient to call if consistently greater than 130/80 Continue DASH diet.   Reminder to go to the ER if any CP, SOB, nausea, dizziness, severe HA, changes vision/speech, left arm numbness and tingling and jaw pain.  Cholesterol Currently above LDL goal; increase rosuvastatin to 40 mg if needed pending labs today Continue low cholesterol diet and exercise.  Check lipid panel.   T2DM New diabetic; on metformin 2000 mg daily, working on lifestyle and weight loss  Goal to reverse and get off of medications Continue diet and exercise.  Perform daily foot/skin check, notify office of any concerning changes.  Check A1C  Obesity with co morbidities Long discussion about weight loss, diet, and exercise Recommended diet heavy in fruits and veggies and low in animal meats, cheeses, and dairy products, appropriate calorie intake Discussed ideal weight for height  Patient will work on cutting down soda; cutting back on biscuits, bread, white potatoes Patient will start on phentermine - follow up 3 months Phentermine - has been helpful, plans to restart Weight goal <200 lb Will follow up in 3 months  Vitamin D Def Near goal at last visit; taking 25003 IU daily  Defer Vit D level  Hypogonadism - continue replacement therapy, check testosterone levels as needed.  Due today - shot administered by CMA - 200 mg IM Continue 200 mg every 2 weeks; tolerating this dose with erythrocytosis resolved Encouraged weight loss, HIIT, zinc 50 mg daily supplement  Hereditary spherocytosis s/p splenectomy/ erythrocytosis Followed by Dr. Myna Hidalgo, plans to follow up in 3 months Getting phlebotomies PRN  Check CBC  R toe onychomychosis Has completed 1 course of lamisil last year with persistent symtposm Will repeat course; recheck LFT at 6 weeks If persistent refer to podiatry for  further management if desired  Continue diet and meds as discussed. Further disposition pending results of labs. Discussed med's effects and SE's.   Over 30 minutes of exam, counseling, chart review, and critical decision making was performed.   Future Appointments  Date Time Provider Department Center  03/02/2019  2:45 PM [facility] LAB [facility] None  03/02/2019  3:15 PM [place], Brand Males, Noah Dennis [facility] None  03/02/2019  3:45 PM [facility] B1 [facility] None  08/30/2019  2:00 PM [name], MD [facility] None    ----------------------------------------------------------------------------------------------------------------------  HPI 48 y.o. male  presents for 3 month follow up on hypertension, cholesterol, T2 diabetes, obesity, hypogonadism and vitamin D deficiency.   He has persistent R large toe nail diagnosed as fungus ongoing for several years, completed 1 cycle of lamisil last year, has persistent sx   he is prescribed phentermine for weight loss but he hasn't been taking in the last few weeks, plans to restart soon.  He denies palpitations, anxiety, trouble sleeping, elevated BP. He is down 5 lb since last visit. Down from 232 lb at highest.   BMI is Body mass index is 31.9 kg/m., he has been working on diet, trying to eat better, drinking more water He was eating subs, grilled chicken, doing intermittent fasting (fasting from 8pm-10-11 AM).  Reports down from 7-8 sodas a day to 1-2, trying to wean down Has reduced alcohol, only 1-2 times per month Wt Readings from Last 3 Encounters:  12/28/18 216 lb (98 kg)  12/15/18 215 lb (97.5 kg)  12/01/18 214 lb (97.1 kg)   Breakfast: chicken biscuit - chickfila or [place], was  doing low carb platter previously Lunch: [employer] or [place] [employer]; white bread - gets smallest size  Dinner: [employer] cafeteria, fried chicken, mashed potatoes, bread   Dessert: a few crackers, a few chip a hoy cookies  He does not currently check BP at home, today  their BP is BP: 130/88  He does not workout. He denies chest pain, shortness of breath, dizziness.   He is on cholesterol medication (rosuvastatin 20 mg daily) and denies myalgias. His cholesterol is not at goal. The cholesterol last visit was:   Lab Results  Component Value Date   CHOL 156 08/18/2018   HDL 33 (L) 08/18/2018   LDLCALC 89 08/18/2018   TRIG 244 (H) 08/18/2018   CHOLHDL 4.7 08/18/2018    He has not been working on diet and exercise for T2 diabetes (since 07/2017) treated by metformin 500 mg x 4 tabs daily, and denies foot ulcerations, increased appetite, nausea, paresthesia of the feet, polydipsia, polyuria, visual disturbances, vomiting and weight loss. He refuses glucometer. Last A1C in the office was:  Lab Results  Component Value Date   HGBA1C 8.4 (H) 08/18/2018   Lab Results  Component Value Date   GFRNONAA >60 11/30/2018   Patient is on Vitamin D supplement, taking 10000 IU daily, hasn't increased since last.   Lab Results  Component Value Date   VD25OH 61 08/18/2018     He has a history of testosterone deficiency and is on testosterone replacement. His dose was reduced from 400 mg every 2 weeks to 200 mg every 2 weeks due to erythrocytosis which has since resolved. He states that the testosterone helps with his energy, libido, muscle mass.  Lab Results  Component Value Date   TESTOSTERONE 673 10/25/2018   He has hx of splenectomy remotely due to hereditary spherocytosis; also followed for erythrocytosis, was referred to hem Dr. Marin Olp per his request and had phlebotomy but monitoring only at this time due to improved since reducing testosterone dose:  Lab Results  Component Value Date   WBC 14.8 (H) 11/30/2018   HGB 16.8 11/30/2018   HCT 47.5 11/30/2018   MCV 92.6 11/30/2018   PLT 530 (H) 11/30/2018      Current Medications:  Current Outpatient Medications on File Prior to Visit  Medication Sig  . Cholecalciferol (VITAMIN D-3) 5000 units TABS Take  5,000 Units by mouth 2 (two) times daily.  Marland Kitchen doxycycline (VIBRAMYCIN) 100 MG capsule TAKE 1 CAPSULE BY MOUTH TWICE DAILY FOR ROSACEA  . finasteride (PROSCAR) 5 MG tablet TAKE 1 TABLET (5 MG TOTAL) BY MOUTH DAILY.  Marland Kitchen lisinopril (PRINIVIL,ZESTRIL) 20 MG tablet TAKE 1 TABLET BY MOUTH EVERY DAY  . metFORMIN (GLUCOPHAGE-XR) 500 MG 24 hr tablet Take 2 tablets 2 x /day with Meals for Diabetes  . phentermine (ADIPEX-P) 37.5 MG tablet Take 1 tablet (37.5 mg total) by mouth daily before breakfast.  . rosuvastatin (CRESTOR) 40 MG tablet TAKE 1 TABLET BY MOUTH EVERY DAY  . tadalafil (CIALIS) 20 MG tablet 1/2-1 tablet as needed for erectile dysfunction  . testosterone cypionate (DEPOTESTOSTERONE CYPIONATE) 200 MG/ML injection INJECT 2 ML INTO THE MUSCLE EVERY 2 WEEKS (Patient taking differently: 100 mg. )  . terbinafine (LAMISIL) 250 MG tablet TAKE 1 TABLET BY MOUTH EVERY DAY (Patient not taking: Reported on 11/30/2018)   No current facility-administered medications on file prior to visit.      Allergies: No Known Allergies   Medical History:  Past Medical History:  Diagnosis Date  . Erythrocytosis due  to endocrine disorders 10/25/2018  . Hyperlipidemia   . Hypertension   . Hypogonadism male   . Insulin resistance   . Pre-diabetes    Family history- Reviewed and unchanged Social history- Reviewed and unchanged   Review of Systems:  Review of Systems  Constitutional: Negative for malaise/fatigue and weight loss.  HENT: Negative for hearing loss and tinnitus.   Eyes: Negative for blurred vision and double vision.  Respiratory: Negative for cough, shortness of breath and wheezing.   Cardiovascular: Negative for chest pain, palpitations, orthopnea, claudication and leg swelling.  Gastrointestinal: Negative for abdominal pain, blood in stool, constipation, diarrhea, heartburn, melena, nausea and vomiting.  Genitourinary: Negative.   Musculoskeletal: Negative for joint pain and myalgias.  Skin:  Negative for rash.  Neurological: Negative for dizziness, tingling, sensory change, weakness and headaches.  Endo/Heme/Allergies: Negative for polydipsia.  Psychiatric/Behavioral: Negative.   All other systems reviewed and are negative.   Physical Exam: BP 130/88   Pulse 93   Temp 97.7 F (36.5 C)   Wt 216 lb (98 kg)   SpO2 96%   BMI 31.90 kg/m  Wt Readings from Last 3 Encounters:  12/28/18 216 lb (98 kg)  12/15/18 215 lb (97.5 kg)  12/01/18 214 lb (97.1 kg)   General Appearance: Well nourished, in no apparent distress. Eyes: PERRLA, EOMs, conjunctiva no swelling or erythema Sinuses: No Frontal/maxillary tenderness ENT/Mouth: Ext aud canals clear, TMs without erythema, bulging. No erythema, swelling, or exudate on post pharynx.  Tonsils not swollen or erythematous. Hearing normal.  Neck: Supple, thyroid normal.  Respiratory: Respiratory effort normal, BS equal bilaterally without rales, rhonchi, wheezing or stridor.  Cardio: RRR with no MRGs. Brisk peripheral pulses without edema.  Abdomen: Soft, + BS.  Non tender, no guarding, rebound, hernias, masses. Lymphatics: Non tender without lymphadenopathy.  Musculoskeletal: Full ROM, 5/5 strength, Normal gait Skin: Warm, dry without rashes, lesions, ecchymosis. R great toe nail medial border is thickened, yellow Neuro: Cranial nerves intact. No cerebellar symptoms.  Psych: Awake and oriented X 3, normal affect, Insight and Judgment appropriate.    Noah MakerAshley C Kateline Kinkade, Noah Dennis 4:36 PM [facility]

## 2018-12-28 ENCOUNTER — Other Ambulatory Visit: Payer: Self-pay

## 2018-12-28 ENCOUNTER — Encounter: Payer: Self-pay | Admitting: Adult Health

## 2018-12-28 ENCOUNTER — Ambulatory Visit: Payer: BC Managed Care – PPO | Admitting: Adult Health

## 2018-12-28 ENCOUNTER — Ambulatory Visit: Payer: BC Managed Care – PPO

## 2018-12-28 VITALS — BP 130/88 | HR 93 | Temp 97.7°F | Wt 216.0 lb

## 2018-12-28 DIAGNOSIS — E1169 Type 2 diabetes mellitus with other specified complication: Secondary | ICD-10-CM | POA: Diagnosis not present

## 2018-12-28 DIAGNOSIS — Z79899 Other long term (current) drug therapy: Secondary | ICD-10-CM | POA: Diagnosis not present

## 2018-12-28 DIAGNOSIS — D580 Hereditary spherocytosis: Secondary | ICD-10-CM

## 2018-12-28 DIAGNOSIS — E785 Hyperlipidemia, unspecified: Secondary | ICD-10-CM

## 2018-12-28 DIAGNOSIS — D751 Secondary polycythemia: Secondary | ICD-10-CM

## 2018-12-28 DIAGNOSIS — I10 Essential (primary) hypertension: Secondary | ICD-10-CM

## 2018-12-28 DIAGNOSIS — E349 Endocrine disorder, unspecified: Secondary | ICD-10-CM | POA: Diagnosis not present

## 2018-12-28 DIAGNOSIS — E119 Type 2 diabetes mellitus without complications: Secondary | ICD-10-CM | POA: Diagnosis not present

## 2018-12-28 DIAGNOSIS — E669 Obesity, unspecified: Secondary | ICD-10-CM

## 2018-12-28 DIAGNOSIS — E559 Vitamin D deficiency, unspecified: Secondary | ICD-10-CM

## 2018-12-28 MED ORDER — TERBINAFINE HCL 250 MG PO TABS: 250.0000 mg | ORAL_TABLET | Freq: Every day | ORAL | 0 refills | Status: DC

## 2018-12-28 MED ORDER — TESTOSTERONE CYPIONATE 200 MG/ML IM SOLN
200.0000 mg | Freq: Once | INTRAMUSCULAR | Status: AC
  Administered 2018-12-28: 100 mg via INTRAMUSCULAR

## 2018-12-28 NOTE — Patient Instructions (Addendum)
Goals    . HEMOGLOBIN A1C < 5.7    . Weight (lb) < 200 lb (90.7 kg)       Call your pharmacy and ask if your metformin was recalled and needs to be changed - call me back if we do need to send in new med   Anything with sugar, flour, white potatoes, white rice   Check out frozen fruit - mixed berries, cherries - both make great smoothies  Can blend with greek yogurt and protein powder for great smoothie  Default great meal - lean protein + veggies  Zinc 50 mg daily supplement daily - can help testosterone  High Intensity Interval Training - HIIT exercise can help testosterone, maintain muscle mass, lower fat mass  HIIT, or high-intensity interval training, is a training technique in which you give all-out, one hundred percent effort through quick, intense bursts of exercise (30-60 seconds of sprinting), followed by short, sometimes active, recovery periods (3-5 min of slower pace jogging or walking). This type of training gets and keeps your heart rate up and burns more fat in less time.    Lower fat mass with help improve testosterone      Drink 1/2 your body weight in fluid ounces of water daily; drink a tall glass of water 30 min before meals  Don't eat until you're stuffed- listen to your stomach and eat until you are 80% full   Try eating off of a salad plate; wait 10 min after finishing before going back for seconds  Start by eating the vegetables on your plate; aim for 50% of your meals to be fruits or vegetables  Then eat your protein - lean meats (grass fed if possible), fish, beans, nuts in moderation  Eat your carbs/starch last ONLY if you still are hungry. If you can, stop before finishing it all  Avoid sugar and flour - the closer it looks to it's original form in nature, typically the better it is for you  Splurge in moderation - "assign" days when you get to splurge and have the "bad stuff" - I like to follow a 80% - 20% plan- "good" choices 80 % of the time,  "bad" choices in moderation 20% of the time  Simple equation is: Calories out > calories in = weight loss - even if you eat the bad stuff, if you limit portions, you will still lose weight       Bad carbs also include fruit juice, alcohol, and sweet tea. These are empty calories that do not signal to your brain that you are full.   Please remember the good carbs are still carbs which convert into sugar. So please measure them out no more than 1/2-1 cup of rice, oatmeal, pasta, and beans  Veggies are however free foods! Pile them on.   Not all fruit is created equal. Please see the list below, the fruit at the bottom is higher in sugars than the fruit at the top. Please avoid all dried fruits.       Terbinafine tablets What is this medicine? TERBINAFINE (TER bin a feen) is an antifungal medicine. It is used to treat certain kinds of fungal or yeast infections. This medicine may be used for other purposes; ask your health care provider or pharmacist if you have questions. COMMON BRAND NAME(S): Lamisil, Terbinex What should I tell my health care provider before I take this medicine? They need to know if you have any of these conditions:  drink alcoholic  beverages  kidney disease  liver disease  an unusual or allergic reaction to terbinafine, other medicines, foods, dyes, or preservatives  pregnant or trying to get pregnant  breast-feeding How should I use this medicine? Take this medicine by mouth with a full glass of water. Follow the directions on the prescription label. You can take this medicine with food or on an empty stomach. Take your medicine at regular intervals. Do not take your medicine more often than directed. Do not skip doses or stop your medicine early even if you feel better. Do not stop taking except on your doctor's advice. Talk to your pediatrician regarding the use of this medicine in children. Special care may be needed. Overdosage: If you think you have  taken too much of this medicine contact a poison control center or emergency room at once. NOTE: This medicine is only for you. Do not share this medicine with others. What if I miss a dose? If you miss a dose, take it as soon as you can. If it is almost time for your next dose, take only that dose. Do not take double or extra doses. What may interact with this medicine? Do not take this medicine with any of the following medications:  thioridazine This medicine may also interact with the following medications:  beta-blockers  caffeine  cimetidine  cyclosporine  medicines for depression, anxiety, or psychotic disturbances  medicines for fungal infections like fluconazole and ketoconazole  medicines for irregular heartbeat like amiodarone, flecainide and propafenone  rifampin  warfarin This list may not describe all possible interactions. Give your health care provider a list of all the medicines, herbs, non-prescription drugs, or dietary supplements you use. Also tell them if you smoke, drink alcohol, or use illegal drugs. Some items may interact with your medicine. What should I watch for while using this medicine? Visit your doctor or health care provider regularly. Tell your doctor right away if you have nausea or vomiting, loss of appetite, stomach pain on your right upper side, yellow skin, dark urine, light stools, or are over tired. Some fungal infections need many weeks or months of treatment to cure. If you are taking this medicine for a long time, you will need to have important blood work done. This medicine may cause serious skin reactions. They can happen weeks to months after starting the medicine. Contact your health care provider right away if you notice fevers or flu-like symptoms with a rash. The rash may be red or purple and then turn into blisters or peeling of the skin. Or, you might notice a red rash with swelling of the face, lips or lymph nodes in your neck or  under your arms. What side effects may I notice from receiving this medicine? Side effects that you should report to your doctor or health care professional as soon as possible:  allergic reactions like skin rash or hives, swelling of the face, lips, or tongue  changes in vision  dark urine  fever or infection  general ill feeling or flu-like symptoms  light-colored stools  loss of appetite, nausea  rash, fever, and swollen lymph nodes  redness, blistering, peeling or loosening of the skin, including inside the mouth  right upper belly pain  unusually weak or tired  yellowing of the eyes or skin Side effects that usually do not require medical attention (report to your doctor or health care professional if they continue or are bothersome):  changes in taste  diarrhea  hair loss  muscle or joint pain  stomach gas  stomach upset This list may not describe all possible side effects. Call your doctor for medical advice about side effects. You may report side effects to FDA at 1-800-FDA-1088. Where should I keep my medicine? Keep out of the reach of children. Store at room temperature below 25 degrees C (77 degrees F). Protect from light. Throw away any unused medicine after the expiration date. NOTE: This sheet is a summary. It may not cover all possible information. If you have questions about this medicine, talk to your doctor, pharmacist, or health care provider.  2020 Elsevier/Gold Standard (2018-07-22 15:37:07)    Can do zinc 40-50 mg a day Can try shake that is whey protein, almond milk, avocado oil, and spinach and strawberries in the morning  9 Ways to Naturally Increase Testosterone Levels  1.   Lose Weight If you're overweight, shedding the excess pounds may increase your testosterone levels, according to research presented at the Endocrine Society's 2012 meeting. Overweight men are more likely to have low testosterone levels to begin with, so this is an  important trick to increase your body's testosterone production when you need it most.  2.   High-Intensity Exercise like Peak Fitness  Short intense exercise has a proven positive effect on increasing testosterone levels and preventing its decline. That's unlike aerobics or prolonged moderate exercise, which have shown to have negative or no effect on testosterone levels. Having a whey protein meal after exercise can further enhance the satiety/testosterone-boosting impact (hunger hormones cause the opposite effect on your testosterone and libido). Here's a summary of what a typical high-intensity Peak Fitness routine might look like: " Warm up for three minutes  " Exercise as hard and fast as you can for 30 seconds. You should feel like you couldn't possibly go on another few seconds  " Recover at a slow to moderate pace for 90 seconds  " Repeat the high intensity exercise and recovery 7 more times .  3.   Consume Plenty of Zinc The mineral zinc is important for testosterone production, and supplementing your diet for as little as six weeks has been shown to cause a marked improvement in testosterone among men with low levels.1 Likewise, research has shown that restricting dietary sources of zinc leads to a significant decrease in testosterone, while zinc supplementation increases it2 -- and even protects men from exercised-induced reductions in testosterone levels.3 It's estimated that up to 45 percent of adults over the age of 60 may have lower than recommended zinc intakes; even when dietary supplements were added in, an estimated 20-25 percent of older adults still had inadequate zinc intakes, according to a [employer] and Nutrition Examination Survey.4 Your diet is the best source of zinc; along with protein-rich foods like meats and fish, other good dietary sources of zinc include raw milk, raw cheese, beans, and yogurt or kefir made from raw milk. It can be difficult to obtain enough  dietary zinc if you're a vegetarian, and also for meat-eaters as well, largely because of conventional farming methods that rely heavily on chemical fertilizers and pesticides. These chemicals deplete the soil of nutrients ... nutrients like zinc that must be absorbed by plants in order to be passed on to you. In many cases, you may further deplete the nutrients in your food by the way you prepare it. For most food, cooking it will drastically reduce its levels of nutrients like zinc ... particularly over-cooking, which many people do. If  you decide to use a zinc supplement, stick to a dosage of less than 40 mg a day, as this is the recommended adult upper limit. Taking too much zinc can interfere with your body's ability to absorb other minerals, especially copper, and may cause nausea as a side effect.  4.   Strength Training In addition to Peak Fitness, strength training is also known to boost testosterone levels, provided you are doing so intensely enough. When strength training to boost testosterone, you'll want to increase the weight and lower your number of reps, and then focus on exercises that work a large number of muscles, such as dead lifts or squats.  You can "turbo-charge" your weight training by going slower. By slowing down your movement, you're actually turning it into a high-intensity exercise. Super Slow movement allows your muscle, at the microscopic level, to access the maximum number of cross-bridges between the protein filaments that produce movement in the muscle.   5.   Optimize Your Vitamin D Levels Vitamin D, a steroid hormone, is essential for the healthy development of the nucleus of the sperm cell, and helps maintain semen quality and sperm count. Vitamin D also increases levels of testosterone, which may boost libido. In one study, overweight men who were given vitamin D supplements had a significant increase in testosterone levels after one year.5   6.   Reduce Stress When  you're under a lot of stress, your body releases high levels of the stress hormone cortisol. This hormone actually blocks the effects of testosterone,6 presumably because, from a biological standpoint, testosterone-associated behaviors (mating, competing, aggression) may have lowered your chances of survival in an emergency (hence, the "fight or flight" response is dominant, courtesy of cortisol).  7.   Limit or Eliminate Sugar from Your Diet Testosterone levels decrease after you eat sugar, which is likely because the sugar leads to a high insulin level, another factor leading to low testosterone.7 Based on USDA estimates, the average American consumes 12 teaspoons of sugar a day, which equates to about TWO TONS of sugar during a lifetime.  8.   Eat Healthy Fats By healthy, this means not only mon- and polyunsaturated fats, like that found in avocadoes and nuts, but also saturated, as these are essential for building testosterone. Research shows that a diet with less than 40 percent of energy as fat (and that mainly from animal sources, i.e. saturated) lead to a decrease in testosterone levels.8 My personal diet is about 60-70 percent healthy fat, and other experts agree that the ideal diet includes somewhere between 50-70 percent fat.  It's important to understand that your body requires saturated fats from animal and vegetable sources (such as meat, dairy, certain oils, and tropical plants like coconut) for optimal functioning, and if you neglect this important food group in favor of sugar, grains and other starchy carbs, your health and weight are almost guaranteed to suffer. Examples of healthy fats you can eat more of to give your testosterone levels a boost include: Olives and Olive oil  Coconuts and coconut oil Butter made from raw grass-fed organic milk Raw nuts, such as, almonds or pecans Organic pastured egg yolks Avocados Grass-fed meats Palm oil Unheated organic nut oils   9.   Boost Your  Intake of Branch Chain Amino Acids (BCAA) from Foods Like Whey Protein Research suggests that BCAAs result in higher testosterone levels, particularly when taken along with resistance training.9 While BCAAs are available in supplement form, you'll find the highest concentrations  of BCAAs like leucine in dairy products - especially quality cheeses and whey protein. Even when getting leucine from your natural food supply, it's often wasted or used as a building block instead of an anabolic agent. So to create the correct anabolic environment, you need to boost leucine consumption way beyond mere maintenance levels. That said, keep in mind that using leucine as a free form amino acid can be highly counterproductive as when free form amino acids are artificially administrated, they rapidly enter your circulation while disrupting insulin function, and impairing your body's glycemic control. Food-based leucine is really the ideal form that can benefit your muscles without side effects.

## 2018-12-28 NOTE — Progress Notes (Signed)
Patient is here for Testosterone Cypionate100mg /ml 37ml injection in LEFTupper outer quadrant IM. Patient tolerated well.

## 2018-12-29 LAB — CBC WITH DIFFERENTIAL/PLATELET
Absolute Monocytes: 1430 cells/uL — ABNORMAL HIGH (ref 200–950)
Basophils Absolute: 194 cells/uL (ref 0–200)
Basophils Relative: 1.3 %
Eosinophils Absolute: 685 cells/uL — ABNORMAL HIGH (ref 15–500)
Eosinophils Relative: 4.6 %
HCT: 49.2 % (ref 38.5–50.0)
Hemoglobin: 17.6 g/dL — ABNORMAL HIGH (ref 13.2–17.1)
Lymphs Abs: 4157 cells/uL — ABNORMAL HIGH (ref 850–3900)
MCH: 34.2 pg — ABNORMAL HIGH (ref 27.0–33.0)
MCHC: 35.8 g/dL (ref 32.0–36.0)
MCV: 95.7 fL (ref 80.0–100.0)
MPV: 8.8 fL (ref 7.5–12.5)
Monocytes Relative: 9.6 %
Neutro Abs: 8433 cells/uL — ABNORMAL HIGH (ref 1500–7800)
Neutrophils Relative %: 56.6 %
Platelets: 585 10*3/uL — ABNORMAL HIGH (ref 140–400)
RBC: 5.14 10*6/uL (ref 4.20–5.80)
RDW: 13.6 % (ref 11.0–15.0)
Total Lymphocyte: 27.9 %
WBC: 14.9 10*3/uL — ABNORMAL HIGH (ref 3.8–10.8)

## 2018-12-29 LAB — LIPID PANEL
Cholesterol: 138 mg/dL (ref <200)
HDL: 39 mg/dL — ABNORMAL LOW (ref >=40)
LDL Cholesterol (Calc): 76 mg/dL (calc)
Non-HDL Cholesterol (Calc): 99 mg/dL (calc) (ref <130)
Total CHOL/HDL Ratio: 3.5 (calc) (ref <5.0)
Triglycerides: 145 mg/dL (ref <150)

## 2018-12-29 LAB — COMPLETE METABOLIC PANEL WITH GFR
AG Ratio: 1.8 (calc) (ref 1.0–2.5)
ALT: 18 U/L (ref 9–46)
AST: 18 U/L (ref 10–40)
Albumin: 4.5 g/dL (ref 3.6–5.1)
Alkaline phosphatase (APISO): 48 U/L (ref 36–130)
BUN: 13 mg/dL (ref 7–25)
CO2: 27 mmol/L (ref 20–32)
Calcium: 10.2 mg/dL (ref 8.6–10.3)
Chloride: 103 mmol/L (ref 98–110)
Creat: 1.04 mg/dL (ref 0.60–1.35)
GFR, Est African American: 98 mL/min/{1.73_m2} (ref >=60)
GFR, Est Non African American: 85 mL/min/{1.73_m2} (ref >=60)
Globulin: 2.5 g/dL (calc) (ref 1.9–3.7)
Glucose, Bld: 99 mg/dL (ref 65–99)
Potassium: 4.8 mmol/L (ref 3.5–5.3)
Sodium: 138 mmol/L (ref 135–146)
Total Bilirubin: 0.7 mg/dL (ref 0.2–1.2)
Total Protein: 7 g/dL (ref 6.1–8.1)

## 2018-12-29 LAB — HEMOGLOBIN A1C
Hgb A1c MFr Bld: 5.6 % of total Hgb (ref <5.7)
Mean Plasma Glucose: 114 (calc)
eAG (mmol/L): 6.3 (calc)

## 2018-12-29 LAB — MAGNESIUM: Magnesium: 1.6 mg/dL (ref 1.5–2.5)

## 2018-12-29 LAB — TSH: TSH: 1.23 mIU/L (ref 0.40–4.50)

## 2019-01-11 ENCOUNTER — Ambulatory Visit: Payer: BC Managed Care – PPO

## 2019-01-11 ENCOUNTER — Other Ambulatory Visit: Payer: Self-pay

## 2019-01-11 VITALS — BP 130/82 | HR 85 | Temp 97.5°F | Wt 216.0 lb

## 2019-01-11 DIAGNOSIS — E349 Endocrine disorder, unspecified: Secondary | ICD-10-CM | POA: Diagnosis not present

## 2019-01-11 MED ORDER — TESTOSTERONE CYPIONATE 200 MG/ML IM SOLN
200.0000 mg | Freq: Once | INTRAMUSCULAR | Status: AC
  Administered 2019-01-11: 100 mg via INTRAMUSCULAR

## 2019-01-11 NOTE — Progress Notes (Signed)
Patient is here for Testosterone Cypionate100mg /ml 41ml injection inRIGHTupper outer quadrant IM. Patient tolerated well.

## 2019-01-25 ENCOUNTER — Other Ambulatory Visit: Payer: Self-pay

## 2019-01-25 ENCOUNTER — Ambulatory Visit: Payer: BC Managed Care – PPO

## 2019-01-25 VITALS — BP 138/80 | HR 101 | Temp 97.3°F | Ht 69.0 in | Wt 218.0 lb

## 2019-01-25 DIAGNOSIS — E349 Endocrine disorder, unspecified: Secondary | ICD-10-CM

## 2019-01-25 MED ORDER — TESTOSTERONE CYPIONATE 200 MG/ML IM SOLN
200.0000 mg | INTRAMUSCULAR | Status: DC
  Administered 2019-01-25: 200 mg via INTRAMUSCULAR

## 2019-01-25 NOTE — Progress Notes (Signed)
  Patient is here for Testosterone Cypionate100mg /ml 60ml injection inLEFTupper outer quadrant IM. Patient tolerated well.

## 2019-02-01 ENCOUNTER — Other Ambulatory Visit: Payer: Self-pay | Admitting: Adult Health

## 2019-02-08 ENCOUNTER — Other Ambulatory Visit: Payer: Self-pay

## 2019-02-08 ENCOUNTER — Ambulatory Visit: Payer: BC Managed Care – PPO | Admitting: *Deleted

## 2019-02-08 VITALS — BP 120/84 | HR 80 | Temp 97.0°F | Resp 16 | Wt 220.4 lb

## 2019-02-08 DIAGNOSIS — Z79899 Other long term (current) drug therapy: Secondary | ICD-10-CM | POA: Diagnosis not present

## 2019-02-08 DIAGNOSIS — E349 Endocrine disorder, unspecified: Secondary | ICD-10-CM

## 2019-02-08 LAB — HEPATIC FUNCTION PANEL
AG Ratio: 1.7 (calc) (ref 1.0–2.5)
ALT: 22 U/L (ref 9–46)
AST: 19 U/L (ref 10–40)
Albumin: 4.3 g/dL (ref 3.6–5.1)
Alkaline phosphatase (APISO): 48 U/L (ref 36–130)
Bilirubin, Direct: 0.1 mg/dL (ref 0.0–0.2)
Globulin: 2.5 g/dL (calc) (ref 1.9–3.7)
Indirect Bilirubin: 0.6 mg/dL (calc) (ref 0.2–1.2)
Total Bilirubin: 0.7 mg/dL (ref 0.2–1.2)
Total Protein: 6.8 g/dL (ref 6.1–8.1)

## 2019-02-08 MED ORDER — TESTOSTERONE CYPIONATE 200 MG/ML IM SOLN
200.0000 mg | Freq: Once | INTRAMUSCULAR | Status: AC
  Administered 2019-02-08: 100 mg via INTRAMUSCULAR

## 2019-02-08 NOTE — Progress Notes (Signed)
Patient is here for a NV and lab. He is currently taking Terbinafine and an HFP was drawn. He received his Testosterone Cypionate 200 mg/ml 1 ml IM in his right upper outer quadrant. Patient tolerated well and will return in 2 weeks for his next injection.

## 2019-02-09 ENCOUNTER — Ambulatory Visit: Payer: BC Managed Care – PPO

## 2019-02-22 ENCOUNTER — Other Ambulatory Visit: Payer: Self-pay | Admitting: Internal Medicine

## 2019-02-22 ENCOUNTER — Ambulatory Visit: Payer: BC Managed Care – PPO | Admitting: *Deleted

## 2019-02-22 ENCOUNTER — Other Ambulatory Visit: Payer: Self-pay

## 2019-02-22 VITALS — BP 110/76 | HR 80 | Temp 97.0°F | Resp 16 | Wt 220.2 lb

## 2019-02-22 DIAGNOSIS — E349 Endocrine disorder, unspecified: Secondary | ICD-10-CM

## 2019-02-22 MED ORDER — PROMETHAZINE-DM 6.25-15 MG/5ML PO SYRP: ORAL_SOLUTION | ORAL | 1 refills | Status: DC

## 2019-02-22 MED ORDER — TESTOSTERONE CYPIONATE 200 MG/ML IM SOLN
400.0000 mg | Freq: Once | INTRAMUSCULAR | Status: AC
  Administered 2019-02-22: 200 mg via INTRAMUSCULAR

## 2019-02-22 NOTE — Progress Notes (Signed)
Patient is here for a NV for his Testosterone Cypionate 200 mg/ml 1 ml IM injection in his left upper outer quadrant. Patient tolerasted well and will return in 2 weeks for his next injection.

## 2019-03-02 ENCOUNTER — Other Ambulatory Visit: Payer: Self-pay

## 2019-03-02 ENCOUNTER — Inpatient Hospital Stay: Payer: BC Managed Care – PPO

## 2019-03-02 ENCOUNTER — Encounter: Payer: Self-pay | Admitting: Family

## 2019-03-02 ENCOUNTER — Inpatient Hospital Stay: Payer: BC Managed Care – PPO | Attending: Hematology & Oncology | Admitting: Family

## 2019-03-02 VITALS — BP 126/86 | HR 94 | Temp 97.5°F | Resp 18 | Wt 218.8 lb

## 2019-03-02 DIAGNOSIS — D751 Secondary polycythemia: Secondary | ICD-10-CM

## 2019-03-02 DIAGNOSIS — Z79899 Other long term (current) drug therapy: Secondary | ICD-10-CM | POA: Diagnosis not present

## 2019-03-02 DIAGNOSIS — E349 Endocrine disorder, unspecified: Secondary | ICD-10-CM

## 2019-03-02 DIAGNOSIS — D580 Hereditary spherocytosis: Secondary | ICD-10-CM | POA: Diagnosis not present

## 2019-03-02 LAB — CBC WITH DIFFERENTIAL (CANCER CENTER ONLY)
Abs Immature Granulocytes: 0.09 10*3/uL — ABNORMAL HIGH (ref 0.00–0.07)
Basophils Absolute: 0.2 10*3/uL — ABNORMAL HIGH (ref 0.0–0.1)
Basophils Relative: 1 %
Eosinophils Absolute: 0.6 10*3/uL — ABNORMAL HIGH (ref 0.0–0.5)
Eosinophils Relative: 4 %
HCT: 47.7 % (ref 39.0–52.0)
Hemoglobin: 17.1 g/dL — ABNORMAL HIGH (ref 13.0–17.0)
Immature Granulocytes: 1 %
Lymphocytes Relative: 26 %
Lymphs Abs: 3.8 10*3/uL (ref 0.7–4.0)
MCH: 32.7 pg (ref 26.0–34.0)
MCHC: 35.8 g/dL (ref 30.0–36.0)
MCV: 91.2 fL (ref 80.0–100.0)
Monocytes Absolute: 1.4 10*3/uL — ABNORMAL HIGH (ref 0.1–1.0)
Monocytes Relative: 9 %
Neutro Abs: 8.7 10*3/uL — ABNORMAL HIGH (ref 1.7–7.7)
Neutrophils Relative %: 59 %
Platelet Count: 555 10*3/uL — ABNORMAL HIGH (ref 150–400)
RBC: 5.23 MIL/uL (ref 4.22–5.81)
RDW: 11.4 % — ABNORMAL LOW (ref 11.5–15.5)
WBC Count: 14.7 10*3/uL — ABNORMAL HIGH (ref 4.0–10.5)
nRBC: 0 % (ref 0.0–0.2)

## 2019-03-02 LAB — CMP (CANCER CENTER ONLY)
ALT: 21 U/L (ref 0–44)
AST: 17 U/L (ref 15–41)
Albumin: 4.5 g/dL (ref 3.5–5.0)
Alkaline Phosphatase: 52 U/L (ref 38–126)
Anion gap: 7 (ref 5–15)
BUN: 14 mg/dL (ref 6–20)
CO2: 29 mmol/L (ref 22–32)
Calcium: 9.7 mg/dL (ref 8.9–10.3)
Chloride: 103 mmol/L (ref 98–111)
Creatinine: 1.25 mg/dL — ABNORMAL HIGH (ref 0.61–1.24)
GFR, Est AFR Am: >60 mL/min (ref >60)
GFR, Estimated: >60 mL/min (ref >60)
Glucose, Bld: 163 mg/dL — ABNORMAL HIGH (ref 70–99)
Potassium: 4.5 mmol/L (ref 3.5–5.1)
Sodium: 139 mmol/L (ref 135–145)
Total Bilirubin: 0.7 mg/dL (ref 0.3–1.2)
Total Protein: 6.9 g/dL (ref 6.5–8.1)

## 2019-03-02 NOTE — Progress Notes (Signed)
Hematology and Oncology Follow Up Visit  Noah Dennis [id number] [birth year] 48 y.o. 03/02/2019   Principle Diagnosis:  Hereditary spherocytosis with splenectomy  Erythrocytosis secondary to testosterone supplementation   Current Therapy:   Phlebotomy to maintain Hgb < 45% Aspirin 81 mg PO daily   Interim History:  Mr. Noah Dennis is here today for follow-up. He is doing well and has no complaints at this time.  He is doing testosterone injections once every other week. This really helps his quality of life. Hct is stable at 47.7.  He is taking his baby aspirin daily.  WBC count is 14.7 and platelets 555. No fever, chills, n/v, cough, rash, dizziness, SOB, chest pain, palpitations, abdominal pain or changes in bowel or bladder habits.  No swelling, tenderness, numbness or tingling in his extremities.  No bleeding, bruising or petechiae.  He is eating well and staying well hydrated. His weight is stable.   ECOG Performance Status: 0 - Asymptomatic  Medications:  Allergies as of 03/02/2019   No Known Allergies     Medication List       Accurate as of March 02, 2019  3:10 PM. If you have any questions, ask your nurse or doctor.        doxycycline 100 MG capsule Commonly known as: VIBRAMYCIN TAKE 1 CAPSULE BY MOUTH TWICE DAILY FOR ROSACEA   finasteride 5 MG tablet Commonly known as: PROSCAR TAKE 1 TABLET (5 MG TOTAL) BY MOUTH DAILY.   lisinopril 20 MG tablet Commonly known as: ZESTRIL TAKE 1 TABLET BY MOUTH EVERY DAY   metFORMIN 500 MG 24 hr tablet Commonly known as: GLUCOPHAGE-XR Take 2 tablets 2 x /day with Meals for Diabetes   phentermine 37.5 MG tablet Commonly known as: ADIPEX-P Take 1/2 to 1 tablet Daily for Dieting & Weight Loss   promethazine-dextromethorphan 6.25-15 MG/5ML syrup Commonly known as: PROMETHAZINE-DM Take 1 to 2 tsp enery 4 hours if needed for cough   rosuvastatin 40 MG tablet Commonly known as: CRESTOR TAKE 1 TABLET BY MOUTH EVERY DAY    tadalafil 20 MG tablet Commonly known as: Cialis 1/2-1 tablet as needed for erectile dysfunction   terbinafine 250 MG tablet Commonly known as: LAMISIL Take 1 tablet (250 mg total) by mouth daily. Needs liver functions checked at 6 week point (approx 02/08/2019)   testosterone cypionate 200 MG/ML injection Commonly known as: DEPOTESTOSTERONE CYPIONATE INJECT 2 ML INTO THE MUSCLE EVERY 2 WEEKS What changed: See the new instructions.   Vitamin D-3 125 MCG (5000 UT) Tabs Take 5,000 Units by mouth 2 (two) times daily.       Allergies: No Known Allergies  Past Medical History, Surgical history, Social history, and Family History were reviewed and updated.  Review of Systems: All other 10 point review of systems is negative.   Physical Exam:  weight is 218 lb 12 oz (99.2 kg). His temporal temperature is 97.5 F (36.4 C) (abnormal). His blood pressure is 126/86 and his pulse is 94. His respiration is 18 and oxygen saturation is 98%.   Wt Readings from Last 3 Encounters:  03/02/19 218 lb 12 oz (99.2 kg)  02/22/19 220 lb 3.2 oz (99.9 kg)  02/08/19 220 lb 6.4 oz (100 kg)    Ocular: Sclerae unicteric, pupils equal, round and reactive to light Ear-nose-throat: Oropharynx clear, dentition fair Lymphatic: No cervical or supraclavicular adenopathy Lungs no rales or rhonchi, good excursion bilaterally Heart regular rate and rhythm, no murmur appreciated Abd soft, nontender, positive bowel sounds, no liver  or spleen tip palpated on exam, no fluid wave  MSK no focal spinal tenderness, no joint edema Neuro: non-focal, well-oriented, appropriate affect Breasts: Deferred   Lab Results  Component Value Date   WBC 14.7 (H) 03/02/2019   HGB 17.1 (H) 03/02/2019   HCT 47.7 03/02/2019   MCV 91.2 03/02/2019   PLT 555 (H) 03/02/2019   Lab Results  Component Value Date   FERRITIN 30 11/30/2018   IRON 66 11/30/2018   TIBC 360 11/30/2018   UIBC 294 11/30/2018   IRONPCTSAT 18 (L)  11/30/2018   Lab Results  Component Value Date   RETICCTPCT 2.9 10/25/2018   RBC 5.23 03/02/2019   No results found for: KPAFRELGTCHN, LAMBDASER, KAPLAMBRATIO No results found for: IGGSERUM, IGA, IGMSERUM No results found for: Kathrynn Ducking, MSPIKE, SPEI   Chemistry      Component Value Date/Time   NA 138 12/28/2018 0000   K 4.8 12/28/2018 0000   CL 103 12/28/2018 0000   CO2 27 12/28/2018 0000   BUN 13 12/28/2018 0000   CREATININE 1.04 12/28/2018 0000      Component Value Date/Time   CALCIUM 10.2 12/28/2018 0000   ALKPHOS 45 11/30/2018 1507   AST 19 02/08/2019 1606   AST 16 11/30/2018 1507   ALT 22 02/08/2019 1606   ALT 16 11/30/2018 1507   BILITOT 0.7 02/08/2019 1606   BILITOT 1.1 11/30/2018 1507       Impression and Plan: Mr. Noah Dennis is a pleasant 48 yo caucasian gentleman with erythrocytosis secondary to testosterone supplementation as well as hereditary spherocytosis.  His counts remain stable and he is doing well.  No phlebotomy needed at this time. We will continue to follow along and plan to see him back in another 6 months per his request.  He will contact our office with any questions or concerns. We can certainly see him sooner if needed.   Laverna Peace, NP 11/5/20203:10 PM

## 2019-03-03 ENCOUNTER — Telehealth: Payer: Self-pay | Admitting: Hematology & Oncology

## 2019-03-03 LAB — IRON AND TIBC
Iron: 137 ug/dL (ref 42–163)
Saturation Ratios: 42 % (ref 20–55)
TIBC: 329 ug/dL (ref 202–409)
UIBC: 192 ug/dL (ref 117–376)

## 2019-03-03 LAB — FERRITIN: Ferritin: 56 ng/mL (ref 24–336)

## 2019-03-03 NOTE — Telephone Encounter (Signed)
LMVM and mailed updated calendar per 11/5 los

## 2019-03-07 ENCOUNTER — Other Ambulatory Visit: Payer: Self-pay | Admitting: Internal Medicine

## 2019-03-07 DIAGNOSIS — E119 Type 2 diabetes mellitus without complications: Secondary | ICD-10-CM

## 2019-03-07 MED ORDER — METFORMIN HCL ER 500 MG PO TB24: ORAL_TABLET | ORAL | 1 refills | Status: DC

## 2019-03-08 ENCOUNTER — Other Ambulatory Visit: Payer: Self-pay

## 2019-03-08 ENCOUNTER — Ambulatory Visit: Payer: BC Managed Care – PPO

## 2019-03-08 VITALS — BP 124/88 | HR 95 | Temp 97.5°F | Ht 69.0 in | Wt 218.0 lb

## 2019-03-08 DIAGNOSIS — E349 Endocrine disorder, unspecified: Secondary | ICD-10-CM | POA: Diagnosis not present

## 2019-03-08 MED ORDER — TESTOSTERONE CYPIONATE 200 MG/ML IM SOLN
200.0000 mg | Freq: Once | INTRAMUSCULAR | Status: AC
  Administered 2019-03-08: 10:00:00 100 mg via INTRAMUSCULAR

## 2019-03-08 NOTE — Progress Notes (Signed)
Patient is here for a NV for his Testosterone Cypionate 200 mg/ml 1 ml IM injection in his RIGHT upper outer quadrant. Patient tolerasted well and will return in 2 weeks for his next injection. 

## 2019-03-18 ENCOUNTER — Other Ambulatory Visit: Payer: Self-pay | Admitting: Internal Medicine

## 2019-03-18 DIAGNOSIS — E349 Endocrine disorder, unspecified: Secondary | ICD-10-CM

## 2019-03-21 ENCOUNTER — Other Ambulatory Visit: Payer: Self-pay | Admitting: Adult Health

## 2019-03-22 ENCOUNTER — Other Ambulatory Visit: Payer: Self-pay

## 2019-03-22 ENCOUNTER — Ambulatory Visit: Payer: BC Managed Care – PPO

## 2019-03-22 VITALS — BP 118/76 | HR 76 | Temp 98.6°F | Wt 220.0 lb

## 2019-03-22 DIAGNOSIS — E349 Endocrine disorder, unspecified: Secondary | ICD-10-CM

## 2019-03-22 MED ORDER — TESTOSTERONE CYPIONATE 200 MG/ML IM SOLN
200.0000 mg | INTRAMUSCULAR | Status: DC
  Administered 2019-03-22: 200 mg via INTRAMUSCULAR

## 2019-03-22 NOTE — Progress Notes (Signed)
Patient is here for a NV for his Testosterone Cypionate 200 mg/ml 1 ml IM injection in hisLEFT upper outer quadrant. Patient tolerated  well and will return in 2 weeks for his next injection.

## 2019-04-03 DIAGNOSIS — N182 Chronic kidney disease, stage 2 (mild): Secondary | ICD-10-CM | POA: Insufficient documentation

## 2019-04-03 DIAGNOSIS — E1122 Type 2 diabetes mellitus with diabetic chronic kidney disease: Secondary | ICD-10-CM | POA: Insufficient documentation

## 2019-04-03 DIAGNOSIS — E1169 Type 2 diabetes mellitus with other specified complication: Secondary | ICD-10-CM | POA: Insufficient documentation

## 2019-04-03 NOTE — Progress Notes (Signed)
FOLLOW UP  Assessment and Plan:   Hypertension Well controlled with current medications  Monitor blood pressure at home; patient to call if consistently greater than 130/80 Continue DASH diet.  Reminder to go to the ER if any CP, SOB, nausea, dizziness, severe HA, changes vision/speech, left arm numbness and tingling and jaw pain.  Cholesterol Currently above LDL goal; taking rosuvastatin 20 mg daily  Titrate for LDL goal <70 Continue low cholesterol diet and exercise.  Check lipid panel.   T2DM New diabetic as of 2019; on metformin 1000 mg daily - increase if 7+ working on lifestyle and weight loss  Goal to reverse and get off of medications Continue diet and exercise.  Perform daily foot/skin check, notify office of any concerning changes.  Check A1C  CKD II associated with T2DM Increase fluids, avoid NSAIDS, monitor sugars, will monitor  ED associated with T2DM Continue testosterone, PRN cialis  Control blood pressure, cholesterol, glucose, increase exercise.   Obesity with co morbidities Long discussion about weight loss, diet, and exercise Recommended diet heavy in fruits and veggies and low in animal meats, cheeses, and dairy products, appropriate calorie intake Discussed ideal weight for height  Patient will work on cutting down soda; cutting back on biscuits, bread, white potatoes Patient will start on phentermine - follow up 3 months Phentermine - has been helpful, plans to restart Weight goal <200 lb Will follow up in 3 months  Vitamin D Def Near goal at last visit; taking 10000 IU daily  Defer Vit D level  Hypogonadism - continue replacement therapy, check testosterone levels as needed.  Due today - shot administered by CMA - 200 mg IM Continue 200 mg every 2 weeks; tolerating this dose with erythrocytosis improved Encouraged weight loss, HIIT, zinc 50 mg daily supplement  Hereditary spherocytosis s/p splenectomy/ erythrocytosis Followed by Dr.  Marin Olp Getting phlebotomies PRN  Check CBC  R toe onychomychosis 2nd course of lamisil due to persistent sx  If persistent refer to podiatry for further management if desired  Continue diet and meds as discussed. Further disposition pending results of labs. Discussed med's effects and SE's.   Over 30 minutes of exam, counseling, chart review, and critical decision making was performed.   Future Appointments  Date Time Provider [place]  04/18/2019 11:00 AM [facility] NURSE [facility] None  07/05/2019  4:00 PM Liane Comber, NP [facility] None  08/30/2019  2:00 PM Unk Pinto, MD [facility] None  09/27/2019 11:30 AM [facility] None  09/27/2019 12:00 PM Ennever, Rudell Cobb, MD [facility] None  09/27/2019 12:30 PM [facility] A1 [facility] None    ----------------------------------------------------------------------------------------------------------------------  HPI 48 y.o. male  presents for 3 month follow up on hypertension, cholesterol, T2 diabetes, obesity, hypogonadism and vitamin D deficiency.   He has persistent R large toe nail diagnosed as fungus ongoing for several years, completed 1 cycle of lamisil last year, has persistent sx and repeated   he is prescribed phentermine for weight loss but he hasn't been taking in the last few months. Peak weight in 234 lb.   BMI is Body mass index is 33.23 kg/m., he was not been working on diet Admits not eating well these past few months due to being busy at work  Reports down from 7-8 sodas a day to 3-4 bottle  Has reduced alcohol, only 1-2 times per month Wt Readings from Last 3 Encounters:  04/05/19 225 lb (102.1 kg)  03/22/19 220 lb (99.8 kg)  03/08/19 218 lb (98.9 kg)  Breakfast: chicken biscuit - chickfila or [place], was doing low carb platter previously Lunch: [employer] or [place] [employer]; white bread - gets smallest size  Dinner: [employer] cafeteria, fried chicken, mashed potatoes, bread  Dessert: a few crackers, a  few chip a hoy cookies  He does not currently check BP at home, doesn't have cuff, today their BP is BP: 124/78  He does not workout. He denies chest pain, shortness of breath, dizziness.   He is on cholesterol medication (rosuvastatin increased to 20 mg daily last visit) and denies myalgias. His cholesterol is not at goal. The cholesterol last visit was:   Lab Results  Component Value Date   CHOL 138 12/28/2018   HDL 39 (L) 12/28/2018   LDLCALC 76 12/28/2018   TRIG 145 12/28/2018   CHOLHDL 3.5 12/28/2018    He has not been working on diet and exercise for T2 diabetes (since 07/2017, 8.4% as of 07/2018) now well controlled on metformin 500 mg x 2 tabs daily, with CKD II and ED and denies foot ulcerations, increased appetite, nausea, paresthesia of the feet, polydipsia, polyuria, visual disturbances, vomiting and weight loss. He refuses glucometer. Last A1C in the office was:  Lab Results  Component Value Date   HGBA1C 5.6 12/28/2018   He has stable CKD II associated with T2DM monitored via this office:  Lab Results  Component Value Date   GFRNONAA >60 03/02/2019   Patient is on Vitamin D supplement, taking 1308610000 IU daily, hasn't increased since last.   Lab Results  Component Value Date   VD25OH 5343 08/18/2018     He has a history of testosterone deficiency and is on testosterone replacement. His dose was reduced from 400 mg every 2 weeks to 200 mg every 2 weeks due to erythrocytosis which has improved. Injection is due today. He states that the testosterone helps with his energy, libido, muscle mass. He is also prescribed cialis PRN due to ED. Lab Results  Component Value Date   TESTOSTERONE 673 10/25/2018   He has hx of splenectomy remotely due to hereditary spherocytosis; also followed for erythrocytosis, was referred to hem Dr. Myna HidalgoEnnever per his request and had phlebotomy but monitoring only at this time due to improved since reducing testosterone dose:  Lab Results  Component Value  Date   WBC 14.7 (H) 03/02/2019   HGB 17.1 (H) 03/02/2019   HCT 47.7 03/02/2019   MCV 91.2 03/02/2019   PLT 555 (H) 03/02/2019      Current Medications:  Current Outpatient Medications on File Prior to Visit  Medication Sig  . Cholecalciferol (VITAMIN D-3) 5000 units TABS Take 5,000 Units by mouth 2 (two) times daily.  Marland Kitchen. doxycycline (VIBRAMYCIN) 100 MG capsule TAKE 1 CAPSULE BY MOUTH TWICE DAILY FOR ROSACEA  . finasteride (PROSCAR) 5 MG tablet TAKE 1 TABLET (5 MG TOTAL) BY MOUTH DAILY.  Marland Kitchen. lisinopril (PRINIVIL,ZESTRIL) 20 MG tablet TAKE 1 TABLET BY MOUTH EVERY DAY  . metFORMIN (GLUCOPHAGE-XR) 500 MG 24 hr tablet Take 2 tablets 2 x /day with Meals for Diabetes  . promethazine-dextromethorphan (PROMETHAZINE-DM) 6.25-15 MG/5ML syrup Take 1 to 2 tsp enery 4 hours if needed for cough  . rosuvastatin (CRESTOR) 40 MG tablet TAKE 1 TABLET BY MOUTH EVERY DAY  . tadalafil (CIALIS) 20 MG tablet 1/2-1 tablet as needed for erectile dysfunction  . terbinafine (LAMISIL) 250 MG tablet TAKE 1 TABLET(250 MG) BY MOUTH DAILY(NEEDS LIVER FUNCTION CHECKED AT 6 WEEK POINT- 02/08/19)  . testosterone cypionate (DEPOTESTOSTERONE CYPIONATE) 200  MG/ML injection Inject 1 ml into muscle every  week  . phentermine (ADIPEX-P) 37.5 MG tablet Take 1/2 to 1 tablet Daily for Dieting & Weight Loss (Patient not taking: Reported on 03/02/2019)   Current Facility-Administered Medications on File Prior to Visit  Medication  . testosterone cypionate (DEPOTESTOSTERONE CYPIONATE) injection 200 mg  . testosterone cypionate (DEPOTESTOSTERONE CYPIONATE) injection 200 mg     Allergies: No Known Allergies   Medical History:  Past Medical History:  Diagnosis Date  . 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 Hyperlipidemia   . Hypertension   . Hypogonadism male   . Insulin resistance   . Pre-diabetes    Family history- Reviewed and unchanged Social history- Reviewed and unchanged   Review of Systems:  Review  of Systems  Constitutional: Negative for malaise/fatigue and weight loss.  HENT: Negative for hearing loss and tinnitus.   Eyes: Negative for blurred vision and double vision.  Respiratory: Negative for cough, shortness of breath and wheezing.   Cardiovascular: Negative for chest pain, palpitations, orthopnea, claudication and leg swelling.  Gastrointestinal: Negative for abdominal pain, blood in stool, constipation, diarrhea, heartburn, melena, nausea and vomiting.  Genitourinary: Negative.   Musculoskeletal: Negative for joint pain and myalgias.  Skin: Negative for rash.  Neurological: Negative for dizziness, tingling, sensory change, weakness and headaches.  Endo/Heme/Allergies: Negative for polydipsia.  Psychiatric/Behavioral: Negative.   All other systems reviewed and are negative.   Physical Exam: BP 124/78   Pulse 98   Temp 97.7 F (36.5 C)   Ht 5\' 9"  (1.753 m)   Wt 225 lb (102.1 kg)   SpO2 97%   BMI 33.23 kg/m  Wt Readings from Last 3 Encounters:  04/05/19 225 lb (102.1 kg)  03/22/19 220 lb (99.8 kg)  03/08/19 218 lb (98.9 kg)   General Appearance: Well nourished, in no apparent distress. Eyes: PERRLA, EOMs, conjunctiva no swelling or erythema Sinuses: No Frontal/maxillary tenderness ENT/Mouth: Ext aud canals clear, TMs without erythema, bulging. No erythema, swelling, or exudate on post pharynx.  Tonsils not swollen or erythematous. Hearing normal.  Neck: Supple, thyroid normal.  Respiratory: Respiratory effort normal, BS equal bilaterally without rales, rhonchi, wheezing or stridor.  Cardio: RRR with no MRGs. Brisk peripheral pulses without edema.  Abdomen: Soft, + BS.  Non tender, no guarding, rebound, hernias, masses. Lymphatics: Non tender without lymphadenopathy.  Musculoskeletal: Full ROM, 5/5 strength, Normal gait Skin: Warm, dry without rashes, lesions, ecchymosis. R great toe nail medial border is thickened, yellow Neuro: Cranial nerves intact. No  cerebellar symptoms.  Psych: Awake and oriented X 3, normal affect, Insight and Judgment appropriate.    13/11/20, NP 4:45 PM [facility]

## 2019-04-05 ENCOUNTER — Other Ambulatory Visit: Payer: Self-pay

## 2019-04-05 ENCOUNTER — Encounter: Payer: Self-pay | Admitting: Adult Health

## 2019-04-05 ENCOUNTER — Ambulatory Visit: Payer: BC Managed Care – PPO | Admitting: Adult Health

## 2019-04-05 VITALS — BP 124/78 | HR 98 | Temp 97.7°F | Ht 69.0 in | Wt 225.0 lb

## 2019-04-05 DIAGNOSIS — E559 Vitamin D deficiency, unspecified: Secondary | ICD-10-CM | POA: Diagnosis not present

## 2019-04-05 DIAGNOSIS — Z79899 Other long term (current) drug therapy: Secondary | ICD-10-CM

## 2019-04-05 DIAGNOSIS — I10 Essential (primary) hypertension: Secondary | ICD-10-CM | POA: Diagnosis not present

## 2019-04-05 DIAGNOSIS — E349 Endocrine disorder, unspecified: Secondary | ICD-10-CM

## 2019-04-05 DIAGNOSIS — E1169 Type 2 diabetes mellitus with other specified complication: Secondary | ICD-10-CM | POA: Diagnosis not present

## 2019-04-05 DIAGNOSIS — N521 Erectile dysfunction due to diseases classified elsewhere: Secondary | ICD-10-CM

## 2019-04-05 DIAGNOSIS — E669 Obesity, unspecified: Secondary | ICD-10-CM

## 2019-04-05 DIAGNOSIS — N182 Chronic kidney disease, stage 2 (mild): Secondary | ICD-10-CM

## 2019-04-05 DIAGNOSIS — Z9081 Acquired absence of spleen: Secondary | ICD-10-CM

## 2019-04-05 DIAGNOSIS — D580 Hereditary spherocytosis: Secondary | ICD-10-CM

## 2019-04-05 DIAGNOSIS — E1122 Type 2 diabetes mellitus with diabetic chronic kidney disease: Secondary | ICD-10-CM

## 2019-04-05 DIAGNOSIS — E785 Hyperlipidemia, unspecified: Secondary | ICD-10-CM

## 2019-04-05 MED ORDER — TADALAFIL 20 MG PO TABS: ORAL_TABLET | ORAL | 0 refills | Status: DC

## 2019-04-05 MED ORDER — BUPROPION HCL ER (XL) 150 MG PO TB24: 150.0000 mg | ORAL_TABLET | ORAL | 1 refills | Status: DC

## 2019-04-05 MED ORDER — TESTOSTERONE CYPIONATE 200 MG/ML IM SOLN
200.0000 mg | Freq: Once | INTRAMUSCULAR | Status: AC
  Administered 2019-04-05: 100 mg via INTRAMUSCULAR

## 2019-04-05 NOTE — Progress Notes (Signed)
Patient is here for a NV for his Testosterone Cypionate 200 mg/ml 1 ml IM injection in his RIGHT upper outer quadrant. Patient tolerasted well and will return in 2 weeks for his next injection.

## 2019-04-05 NOTE — Patient Instructions (Addendum)
Goals    . Exercise 150 min/wk Moderate Activity    . HEMOGLOBIN A1C < 5.7    . Weight (lb) < 200 lb (90.7 kg)      Spend some time and come up with a list of go to meal options that are healthy -   Breakfast:  Eggs and [place] sausage Oatmeal (sugar free) with fruit and nuts, etc  Lunch:  1/2 bread sandwhiches Salad Protein with veggie sides Veggie or bean soup, etc  Have go to options planned out Choose things that you genuinely enjoy, don't force yourself if you don't somewhat enjoy it as the habit will not stick long run  Don't make changes that you can't sustain indefinitely Better to make small changes gradually than drastic changes you can't keep up  But TRY TO CUT OUT SODA         Bad carbs also include fruit juice, alcohol, and sweet tea. These are empty calories that do not signal to your brain that you are full.   Please remember the good carbs are still carbs which convert into sugar. So please measure them out no more than 1/2-1 cup of rice, oatmeal, pasta, and beans  Veggies are however free foods! Pile them on.   Not all fruit is created equal. Please see the list below, the fruit at the bottom is higher in sugars than the fruit at the top. Please avoid all dried fruits.

## 2019-04-06 ENCOUNTER — Other Ambulatory Visit: Payer: Self-pay | Admitting: Internal Medicine

## 2019-04-06 LAB — COMPLETE METABOLIC PANEL WITH GFR
AG Ratio: 1.8 (calc) (ref 1.0–2.5)
ALT: 21 U/L (ref 9–46)
AST: 17 U/L (ref 10–40)
Albumin: 4.4 g/dL (ref 3.6–5.1)
Alkaline phosphatase (APISO): 62 U/L (ref 36–130)
BUN: 12 mg/dL (ref 7–25)
CO2: 27 mmol/L (ref 20–32)
Calcium: 9.8 mg/dL (ref 8.6–10.3)
Chloride: 102 mmol/L (ref 98–110)
Creat: 1.13 mg/dL (ref 0.60–1.35)
GFR, Est African American: 89 mL/min/{1.73_m2} (ref >=60)
GFR, Est Non African American: 76 mL/min/{1.73_m2} (ref >=60)
Globulin: 2.5 g/dL (calc) (ref 1.9–3.7)
Glucose, Bld: 137 mg/dL — ABNORMAL HIGH (ref 65–99)
Potassium: 4.4 mmol/L (ref 3.5–5.3)
Sodium: 137 mmol/L (ref 135–146)
Total Bilirubin: 0.8 mg/dL (ref 0.2–1.2)
Total Protein: 6.9 g/dL (ref 6.1–8.1)

## 2019-04-06 LAB — CBC WITH DIFFERENTIAL/PLATELET
Absolute Monocytes: 1292 cells/uL — ABNORMAL HIGH (ref 200–950)
Basophils Absolute: 148 cells/uL (ref 0–200)
Basophils Relative: 1.2 %
Eosinophils Absolute: 529 cells/uL — ABNORMAL HIGH (ref 15–500)
Eosinophils Relative: 4.3 %
HCT: 47.7 % (ref 38.5–50.0)
Hemoglobin: 17.1 g/dL (ref 13.2–17.1)
Lymphs Abs: 3087 cells/uL (ref 850–3900)
MCH: 32.5 pg (ref 27.0–33.0)
MCHC: 35.8 g/dL (ref 32.0–36.0)
MCV: 90.7 fL (ref 80.0–100.0)
MPV: 8.6 fL (ref 7.5–12.5)
Monocytes Relative: 10.5 %
Neutro Abs: 7245 cells/uL (ref 1500–7800)
Neutrophils Relative %: 58.9 %
Platelets: 509 10*3/uL — ABNORMAL HIGH (ref 140–400)
RBC: 5.26 10*6/uL (ref 4.20–5.80)
RDW: 12.8 % (ref 11.0–15.0)
Total Lymphocyte: 25.1 %
WBC: 12.3 10*3/uL — ABNORMAL HIGH (ref 3.8–10.8)

## 2019-04-06 LAB — TSH: TSH: 1.38 mIU/L (ref 0.40–4.50)

## 2019-04-06 LAB — LIPID PANEL
Cholesterol: 148 mg/dL (ref <200)
HDL: 39 mg/dL — ABNORMAL LOW (ref >=40)
LDL Cholesterol (Calc): 75 mg/dL (calc)
Non-HDL Cholesterol (Calc): 109 mg/dL (calc) (ref <130)
Total CHOL/HDL Ratio: 3.8 (calc) (ref <5.0)
Triglycerides: 245 mg/dL — ABNORMAL HIGH (ref <150)

## 2019-04-06 LAB — HEMOGLOBIN A1C
Hgb A1c MFr Bld: 6.5 % of total Hgb — ABNORMAL HIGH (ref <5.7)
Mean Plasma Glucose: 140 (calc)
eAG (mmol/L): 7.7 (calc)

## 2019-04-06 LAB — MAGNESIUM: Magnesium: 1.7 mg/dL (ref 1.5–2.5)

## 2019-04-06 MED ORDER — FINASTERIDE 5 MG PO TABS: ORAL_TABLET | ORAL | 3 refills | Status: DC

## 2019-04-06 MED ORDER — LISINOPRIL 20 MG PO TABS: ORAL_TABLET | ORAL | 3 refills | Status: DC

## 2019-04-17 ENCOUNTER — Ambulatory Visit: Payer: BC Managed Care – PPO

## 2019-04-17 ENCOUNTER — Other Ambulatory Visit: Payer: Self-pay

## 2019-04-17 VITALS — BP 130/86 | HR 100 | Temp 97.7°F | Wt 227.0 lb

## 2019-04-17 DIAGNOSIS — E349 Endocrine disorder, unspecified: Secondary | ICD-10-CM | POA: Diagnosis not present

## 2019-04-17 DIAGNOSIS — Z79899 Other long term (current) drug therapy: Secondary | ICD-10-CM

## 2019-04-17 MED ORDER — TESTOSTERONE CYPIONATE 200 MG/ML IM SOLN
200.0000 mg | INTRAMUSCULAR | Status: DC
  Administered 2019-04-17: 11:00:00 200 mg via INTRAMUSCULAR

## 2019-04-17 NOTE — Progress Notes (Signed)
Patient is here for a NV for his Testosterone Cypionate 200 mg/ml 1 ml IM injection in his RIGHTupper outer quadrant. Patient tolerated  well and will return in 2 weeks for his next injection.

## 2019-04-18 ENCOUNTER — Ambulatory Visit: Payer: BC Managed Care – PPO

## 2019-04-24 ENCOUNTER — Other Ambulatory Visit: Payer: Self-pay | Admitting: Internal Medicine

## 2019-04-24 DIAGNOSIS — E559 Vitamin D deficiency, unspecified: Secondary | ICD-10-CM

## 2019-04-25 ENCOUNTER — Telehealth: Payer: Self-pay | Admitting: *Deleted

## 2019-04-25 NOTE — Telephone Encounter (Signed)
Patient called and requested an RX for Vitamin D 50000 unit capsules be sent to his pharmacy.  Pe Dr Melford Aase, the patient will need to have a Vitamin D level drawn to determine if that dose is appropriate. The patient states he currently takes 10000 units of Vitamin D daily. The lab will be done at his NV on 05/03/2019.

## 2019-04-30 ENCOUNTER — Other Ambulatory Visit: Payer: Self-pay | Admitting: Adult Health

## 2019-05-03 ENCOUNTER — Other Ambulatory Visit: Payer: Self-pay

## 2019-05-03 ENCOUNTER — Ambulatory Visit: Payer: BC Managed Care – PPO | Admitting: *Deleted

## 2019-05-03 VITALS — BP 124/86 | HR 88 | Temp 97.2°F | Resp 16 | Wt 225.0 lb

## 2019-05-03 DIAGNOSIS — E559 Vitamin D deficiency, unspecified: Secondary | ICD-10-CM

## 2019-05-03 DIAGNOSIS — E349 Endocrine disorder, unspecified: Secondary | ICD-10-CM

## 2019-05-03 MED ORDER — TESTOSTERONE CYPIONATE 200 MG/ML IM SOLN
400.0000 mg | Freq: Once | INTRAMUSCULAR | Status: AC
  Administered 2019-05-03: 200 mg via INTRAMUSCULAR

## 2019-05-03 NOTE — Progress Notes (Signed)
Patient here for a NV to receive his Testosterone Cypionate 200 mg/ml 1 ml injection IM left upper outer quadrant. Patient tolerated well.  Patient having Vitamin D level today, at his request. Patient currently takes 40375 units of Vitamin D3 daily and is asking if he should increase to 50,000 unit dosage.

## 2019-05-04 LAB — VITAMIN D 25 HYDROXY (VIT D DEFICIENCY, FRACTURES): Vit D, 25-Hydroxy: 57 ng/mL (ref 30–100)

## 2019-05-17 ENCOUNTER — Other Ambulatory Visit: Payer: Self-pay

## 2019-05-17 ENCOUNTER — Ambulatory Visit (INDEPENDENT_AMBULATORY_CARE_PROVIDER_SITE_OTHER): Payer: BC Managed Care – PPO

## 2019-05-17 VITALS — BP 124/64 | HR 119 | Temp 97.5°F | Wt 231.0 lb

## 2019-05-17 DIAGNOSIS — E349 Endocrine disorder, unspecified: Secondary | ICD-10-CM

## 2019-05-17 MED ORDER — TESTOSTERONE CYPIONATE 200 MG/ML IM SOLN
200.0000 mg | Freq: Once | INTRAMUSCULAR | Status: AC
  Administered 2019-05-17: 100 mg via INTRAMUSCULAR

## 2019-05-17 NOTE — Progress Notes (Signed)
Patient here for a NV to receive his Testosterone Cypionate 200 mg/ml 1 ml injection IM left upper outer quadrant. Patient tolerated well.

## 2019-05-31 ENCOUNTER — Ambulatory Visit: Payer: BC Managed Care – PPO

## 2019-05-31 ENCOUNTER — Other Ambulatory Visit: Payer: Self-pay

## 2019-05-31 VITALS — BP 124/82 | HR 96 | Temp 97.5°F | Wt 230.2 lb

## 2019-05-31 DIAGNOSIS — E349 Endocrine disorder, unspecified: Secondary | ICD-10-CM

## 2019-05-31 MED ORDER — TESTOSTERONE CYPIONATE 200 MG/ML IM SOLN
200.0000 mg | Freq: Once | INTRAMUSCULAR | Status: AC
  Administered 2019-05-31: 11:00:00 100 mg via INTRAMUSCULAR

## 2019-05-31 NOTE — Progress Notes (Signed)
Patient here for a NV to receive his Testosterone Cypionate 200 mg/ml 1 ml injection IM right upper outer quadrant. Patient tolerated well.

## 2019-06-14 ENCOUNTER — Ambulatory Visit: Payer: BC Managed Care – PPO | Admitting: *Deleted

## 2019-06-14 ENCOUNTER — Other Ambulatory Visit: Payer: Self-pay

## 2019-06-14 VITALS — BP 118/74 | HR 88 | Temp 97.2°F | Resp 16 | Wt 234.8 lb

## 2019-06-14 DIAGNOSIS — E349 Endocrine disorder, unspecified: Secondary | ICD-10-CM | POA: Diagnosis not present

## 2019-06-14 MED ORDER — TESTOSTERONE CYPIONATE 200 MG/ML IM SOLN
400.0000 mg | Freq: Once | INTRAMUSCULAR | Status: AC
  Administered 2019-06-14: 16:00:00 200 mg via INTRAMUSCULAR

## 2019-06-14 NOTE — Progress Notes (Signed)
Patient is here for a NV to receive his Testosterone cypionate 200 mg/ml 1 ml IM injection in his left upper outer quadrant. Patient tolerated well and will receive his next injection at 07/05/2019 office visit.

## 2019-07-04 NOTE — Progress Notes (Signed)
FOLLOW UP  Assessment and Plan:   Hypertension Well controlled with current medications  Monitor blood pressure at home; patient to call if consistently greater than 130/80 Continue DASH diet.  Reminder to go to the ER if any CP, SOB, nausea, dizziness, severe HA, changes vision/speech, left arm numbness and tingling and jaw pain  Hyperlipidemia associated with T2DM (HCC) Currently above LDL goal; taking rosuvastatin 20 mg daily  Titrate for LDL goal <70 Continue low cholesterol diet and exercise.  Check lipid panel.   T2DM with complications (HCC) New diabetic as of 2019; on metformin 1000 mg daily - increase if 7+ working on lifestyle and weight loss  Goal to reverse and get off of medications Continue diet and exercise.  Perform daily foot/skin check, notify office of any concerning changes.  Check A1C  CKD II associated with T2DM (HCC) Increase fluids, avoid NSAIDS, monitor sugars, will monitor  ED associated with T2DM (HCC) Continue testosterone, PRN cialis  Control blood pressure, cholesterol, glucose, increase exercise.   Obesity with co morbidities Long discussion about weight loss, diet, and exercise Recommended diet heavy in fruits and veggies and low in animal meats, cheeses, and dairy products, appropriate calorie intake Discussed ideal weight for height  Patient will work on cutting down soda; he feels reasonable to cut this out fully  Phentermine - has been helpful, plans to restart Weight goal <200 lb Will follow up in 3 months  Vitamin D Def Essentially at goal of 60 at last visit; taking 67893 IU daily  Defer Vit D level  Hypogonadism - continue replacement therapy, check testosterone levels as needed.  Due today - shot administered by CMA - 200 mg IM Continue 200 mg every 2 weeks; tolerating this dose with erythrocytosis improved Encouraged weight loss, HIIT, zinc 50 mg daily supplement  Hereditary spherocytosis (HCC) s/p splenectomy/  erythrocytosis Followed by Dr. Myna Hidalgo Getting phlebotomies PRN; improved with lower testosterone supplement Check CBC   Continue diet and meds as discussed. Further disposition pending results of labs. Discussed med's effects and SE's.   Over 30 minutes of exam, counseling, chart review, and critical decision making was performed.   Future Appointments  Date Time Provider Department Center  07/19/2019  4:00 PM [facility] NURSE [facility] None  08/30/2019  2:00 PM [name], MD [facility] None  09/27/2019 11:30 AM [facility] None  09/27/2019 12:00 PM Ennever, Rose Phi, MD [facility] None  09/27/2019 12:30 PM [facility]1 [facility] None    ----------------------------------------------------------------------------------------------------------------------  HPI 49 y.o. male  presents for 3 month follow up on hypertension, cholesterol, T2 diabetes with CKD and ED, obesity, hypogonadism and vitamin D deficiency.   he is prescribed phentermine for weight loss but he hasn't been taking in the last few months, does plan to restart soon, in April. Peak weight 234 lb.   BMI is Body mass index is 33.94 kg/m., he was not been working on diet Admits not eating well these past few months due to being busy at work  Reports down from 7-8 sodas a day to 3-4 bottle  Has reduced alcohol, only 1 time per month Wt Readings from Last 3 Encounters:  07/05/19 229 lb 12.8 oz (104.2 kg)  06/14/19 234 lb 12.8 oz (106.5 kg)  05/31/19 230 lb 3.2 oz (104.4 kg)   Breakfast: chicken biscuit - chickfila or [place], was doing low carb platter previously Lunch: [employer] or [place] [employer]; white bread - gets smallest size  Dinner: [employer] cafeteria, fried chicken, mashed potatoes,  bread  Dessert: a few crackers, a few chip a hoy cookies  He does not currently check BP at home, doesn't have cuff, today their BP is BP: 136/88  He does not workout. He denies chest pain, shortness of breath, dizziness.   He is on  cholesterol medication (rosuvastatin increased to 20 mg daily last visit) and denies myalgias. His cholesterol is not at goal. The cholesterol last visit was:   Lab Results  Component Value Date   CHOL 148 04/05/2019   HDL 39 (L) 04/05/2019   LDLCALC 75 04/05/2019   TRIG 245 (H) 04/05/2019   CHOLHDL 3.8 04/05/2019    He has not been working on diet and exercise for T2 diabetes (since 07/2017, 8.4% as of 07/2018) now well controlled on metformin 500 mg x 2 tabs daily, with CKD II and ED and denies foot ulcerations, increased appetite, nausea, paresthesia of the feet, polydipsia, polyuria, visual disturbances, vomiting and weight loss. He refuses glucometer. Last A1C in the office was:  Lab Results  Component Value Date   HGBA1C 6.5 (H) 04/05/2019   He has stable CKD II associated with T2DM monitored via this office:  Lab Results  Component Value Date   GFRNONAA 76 04/05/2019   Patient is on Vitamin D supplement, taking 81157 IU daily:   Lab Results  Component Value Date   VD25OH 74 05/03/2019     He has a history of testosterone deficiency and is on testosterone replacement. His dose was reduced from 400 mg every 2 weeks to 200 mg every 2 weeks due to erythrocytosis which has improved. Injection is due today. He states that the testosterone helps with his energy, libido, muscle mass. He is also prescribed cialis PRN due to ED.  Lab Results  Component Value Date   TESTOSTERONE 673 10/25/2018   He has hx of splenectomy remotely due to hereditary spherocytosis; also followed for erythrocytosis, was referred to hem Dr. Myna Hidalgo per his request and had phlebotomy but monitoring only at this time due to improved since reducing testosterone dose:  Lab Results  Component Value Date   WBC 12.3 (H) 04/05/2019   HGB 17.1 04/05/2019   HCT 47.7 04/05/2019   MCV 90.7 04/05/2019   PLT 509 (H) 04/05/2019      Current Medications:  Current Outpatient Medications on File Prior to Visit   Medication Sig  . buPROPion (WELLBUTRIN XL) 150 MG 24 hr tablet Take 1 tablet (150 mg total) by mouth every morning.  . Cholecalciferol (VITAMIN D-3) 5000 units TABS Take 5,000 Units by mouth 2 (two) times daily.  Marland Kitchen doxycycline (VIBRAMYCIN) 100 MG capsule TAKE 1 CAPSULE BY MOUTH TWICE DAILY FOR ROSACEA  . finasteride (PROSCAR) 5 MG tablet Take 1 tablet Daily for Prostate  . lisinopril (ZESTRIL) 20 MG tablet Take 1 tablet Daily for BP  . metFORMIN (GLUCOPHAGE-XR) 500 MG 24 hr tablet Take 2 tablets 2 x /day with Meals for Diabetes  . promethazine-dextromethorphan (PROMETHAZINE-DM) 6.25-15 MG/5ML syrup Take 1 to 2 tsp enery 4 hours if needed for cough  . rosuvastatin (CRESTOR) 40 MG tablet TAKE 1 TABLET BY MOUTH EVERY DAY  . tadalafil (CIALIS) 20 MG tablet Take 1/2 to 1 tablet every 2 to 3 days as needed for XXXX  . testosterone cypionate (DEPOTESTOSTERONE CYPIONATE) 200 MG/ML injection Inject 1 ml into muscle every  week  . phentermine (ADIPEX-P) 37.5 MG tablet Take 1/2 to 1 tablet Daily for Dieting & Weight Loss (Patient not taking: Reported on 03/02/2019)  .  terbinafine (LAMISIL) 250 MG tablet TAKE 1 TABLET(250 MG) BY MOUTH DAILY(NEEDS LIVER FUNCTION CHECKED AT 6 WEEK POINT- 02/08/19) (Patient not taking: Reported on 07/05/2019)   Current Facility-Administered Medications on File Prior to Visit  Medication  . testosterone cypionate (DEPOTESTOSTERONE CYPIONATE) injection 200 mg     Allergies: No Known Allergies   Medical History:  Past Medical History:  Diagnosis Date  . 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 Hyperlipidemia   . Hypertension   . Hypogonadism male   . Insulin resistance   . Pre-diabetes    Family history- Reviewed and unchanged Social history- Reviewed and unchanged   Review of Systems:  Review of Systems  Constitutional: Negative for malaise/fatigue and weight loss.  HENT: Negative for hearing loss and tinnitus.   Eyes: Negative for blurred  vision and double vision.  Respiratory: Negative for cough, shortness of breath and wheezing.   Cardiovascular: Negative for chest pain, palpitations, orthopnea, claudication and leg swelling.  Gastrointestinal: Negative for abdominal pain, blood in stool, constipation, diarrhea, heartburn, melena, nausea and vomiting.  Genitourinary: Negative.   Musculoskeletal: Negative for joint pain and myalgias.  Skin: Negative for rash.  Neurological: Negative for dizziness, tingling, sensory change, weakness and headaches.  Endo/Heme/Allergies: Negative for polydipsia.  Psychiatric/Behavioral: Negative.   All other systems reviewed and are negative.   Physical Exam: BP 136/88   Pulse 98   Temp (!) 97.3 F (36.3 C)   Wt 229 lb 12.8 oz (104.2 kg)   SpO2 98%   BMI 33.94 kg/m  Wt Readings from Last 3 Encounters:  07/05/19 229 lb 12.8 oz (104.2 kg)  06/14/19 234 lb 12.8 oz (106.5 kg)  05/31/19 230 lb 3.2 oz (104.4 kg)   General Appearance: Well nourished, in no apparent distress. Eyes: PERRLA, EOMs, conjunctiva no swelling or erythema Sinuses: No Frontal/maxillary tenderness ENT/Mouth: Ext aud canals clear, TMs without erythema, bulging. No erythema, swelling, or exudate on post pharynx.  Tonsils not swollen or erythematous. Hearing normal.  Neck: Supple, thyroid normal.  Respiratory: Respiratory effort normal, BS equal bilaterally without rales, rhonchi, wheezing or stridor.  Cardio: RRR with no MRGs. Brisk peripheral pulses without edema.  Abdomen: Soft, + BS.  Non tender, no guarding, rebound, hernias, masses. Lymphatics: Non tender without lymphadenopathy.  Musculoskeletal: Full ROM, 5/5 strength, Normal gait Skin: Warm, dry without rashes, lesions, ecchymosis.  Neuro: Cranial nerves intact. No cerebellar symptoms.  Psych: Awake and oriented X 3, normal affect, Insight and Judgment appropriate.    Izora Ribas, NP 4:40 PM [facility]

## 2019-07-05 ENCOUNTER — Ambulatory Visit: Payer: BC Managed Care – PPO | Admitting: Adult Health

## 2019-07-05 ENCOUNTER — Other Ambulatory Visit: Payer: Self-pay

## 2019-07-05 ENCOUNTER — Encounter: Payer: Self-pay | Admitting: Adult Health

## 2019-07-05 VITALS — BP 136/88 | HR 98 | Temp 97.3°F | Wt 229.8 lb

## 2019-07-05 DIAGNOSIS — D580 Hereditary spherocytosis: Secondary | ICD-10-CM

## 2019-07-05 DIAGNOSIS — I10 Essential (primary) hypertension: Secondary | ICD-10-CM | POA: Diagnosis not present

## 2019-07-05 DIAGNOSIS — E559 Vitamin D deficiency, unspecified: Secondary | ICD-10-CM

## 2019-07-05 DIAGNOSIS — Z79899 Other long term (current) drug therapy: Secondary | ICD-10-CM

## 2019-07-05 DIAGNOSIS — E349 Endocrine disorder, unspecified: Secondary | ICD-10-CM | POA: Diagnosis not present

## 2019-07-05 DIAGNOSIS — N182 Chronic kidney disease, stage 2 (mild): Secondary | ICD-10-CM

## 2019-07-05 DIAGNOSIS — Z9081 Acquired absence of spleen: Secondary | ICD-10-CM

## 2019-07-05 DIAGNOSIS — N521 Erectile dysfunction due to diseases classified elsewhere: Secondary | ICD-10-CM

## 2019-07-05 DIAGNOSIS — E669 Obesity, unspecified: Secondary | ICD-10-CM

## 2019-07-05 DIAGNOSIS — E785 Hyperlipidemia, unspecified: Secondary | ICD-10-CM | POA: Diagnosis not present

## 2019-07-05 DIAGNOSIS — E1169 Type 2 diabetes mellitus with other specified complication: Secondary | ICD-10-CM

## 2019-07-05 DIAGNOSIS — E1122 Type 2 diabetes mellitus with diabetic chronic kidney disease: Secondary | ICD-10-CM

## 2019-07-05 DIAGNOSIS — D751 Secondary polycythemia: Secondary | ICD-10-CM

## 2019-07-05 MED ORDER — TESTOSTERONE CYPIONATE 200 MG/ML IM SOLN
200.0000 mg | Freq: Once | INTRAMUSCULAR | Status: AC
  Administered 2019-07-05: 100 mg via INTRAMUSCULAR

## 2019-07-05 NOTE — Progress Notes (Signed)
Patient is here to receive his Testosterone cypionate 200 mg/ml 1 ml IM injection in his RIGHT upper outer quadrant. Patient tolerated well without any complications.

## 2019-07-05 NOTE — Patient Instructions (Addendum)
Goals    . DIET - DECREASE SODA OR JUICE INTAKE    . Exercise 150 min/wk Moderate Activity    . HEMOGLOBIN A1C < 5.7    . Weight (lb) < 200 lb (90.7 kg)       Please cut out soda     Recommend cutting down on sugar intake (SODA)  - the American Heart Association recommends no more than 9 teaspoons (38 g) of added sugar for men daily, and 6 teaspoons (25 g) for women. Added sugar can be in many things that you might not expect - salad dressings, bread that is not home made, "all natural" fruit juice, etc., most processed foods contain hidden sugars. Consider looking at labels and being aware of how much sugar you are consuming in a day. Less is always better for sugar; sugar reduces your body's immune response, and damages blood vessels, leading to increased risk of many diseases.     Diabetes is a very complicated disease...lets simplify it.  An easy way to look at it to understand the complications is if you think of the extra sugar floating in your blood stream as glass shards floating through your blood stream.    Diabetes affects your small vessels first: 1) The glass shards (sugar) scraps down the tiny blood vessels in your eyes and lead to diabetic retinopathy, the leading cause of blindness in the [place]. Diabetes is the leading cause of newly diagnosed adult (66 to 49 years of age) blindness in the [place].  2) The glass shards scratches down the tiny vessels of your legs leading to nerve damage called neuropathy and can lead to amputations of your feet. More than 60% of all non-traumatic amputations of lower limbs occur in people with diabetes.  3) Over time the small vessels in your brain are shredded and closed off, individually this does not cause any problems but over a long period of time many of the small vessels being blocked can lead to Vascular Dementia.   4) Your kidney's are a filter system and have a "net" that keeps certain things in the body and lets bad things  out. Sugar shreds this net and leads to kidney damage and eventually failure. Decreasing the sugar that is destroying the net and certain blood pressure medications can help stop or decrease progression of kidney disease. Diabetes was the primary cause of kidney failure in 44 percent of all new cases in 2011.  5) Diabetes also destroys the small vessels in your penis that lead to erectile dysfunction. Eventually the vessels are so damaged that you may not be responsive to cialis or viagra.   Diabetes and your large vessels: Your larger vessels consist of your coronary arteries in your heart and the carotid vessels to your brain. Diabetes or even increased sugars put you at 300% increased risk of heart attack and stroke and this is why.. The sugar scrapes down your large blood vessels and your body sees this as an internal injury and tries to repair itself. Just like you get a scab on your skin, your platelets will stick to the blood vessel wall trying to heal it. This is why we have diabetics on low dose aspirin daily, this prevents the platelets from sticking and can prevent plaque formation. In addition, your body takes cholesterol and tries to shove it into the open wound. This is why we want your LDL, or bad cholesterol, below 70.   The combination of platelets and cholesterol over  5-10 years forms plaque that can break off and cause a heart attack or stroke.   PLEASE REMEMBER:  Diabetes is preventable! Up to 85 percent of complications and morbidities among individuals with type 2 diabetes can be prevented, delayed, or effectively treated and minimized with regular visits to a health professional, appropriate monitoring and medication, and a healthy diet and lifestyle.

## 2019-07-06 LAB — MAGNESIUM: Magnesium: 1.7 mg/dL (ref 1.5–2.5)

## 2019-07-06 LAB — COMPLETE METABOLIC PANEL WITH GFR
AG Ratio: 1.8 (calc) (ref 1.0–2.5)
ALT: 30 U/L (ref 9–46)
AST: 21 U/L (ref 10–40)
Albumin: 4.6 g/dL (ref 3.6–5.1)
Alkaline phosphatase (APISO): 65 U/L (ref 36–130)
BUN: 13 mg/dL (ref 7–25)
CO2: 29 mmol/L (ref 20–32)
Calcium: 10.2 mg/dL (ref 8.6–10.3)
Chloride: 99 mmol/L (ref 98–110)
Creat: 1.19 mg/dL (ref 0.60–1.35)
GFR, Est African American: 83 mL/min/{1.73_m2} (ref >=60)
GFR, Est Non African American: 72 mL/min/{1.73_m2} (ref >=60)
Globulin: 2.6 g/dL (calc) (ref 1.9–3.7)
Glucose, Bld: 229 mg/dL — ABNORMAL HIGH (ref 65–99)
Potassium: 4.5 mmol/L (ref 3.5–5.3)
Sodium: 135 mmol/L (ref 135–146)
Total Bilirubin: 0.8 mg/dL (ref 0.2–1.2)
Total Protein: 7.2 g/dL (ref 6.1–8.1)

## 2019-07-06 LAB — LIPID PANEL
Cholesterol: 150 mg/dL (ref <200)
HDL: 41 mg/dL (ref >=40)
LDL Cholesterol (Calc): 79 mg/dL (calc)
Non-HDL Cholesterol (Calc): 109 mg/dL (calc) (ref <130)
Total CHOL/HDL Ratio: 3.7 (calc) (ref <5.0)
Triglycerides: 210 mg/dL — ABNORMAL HIGH (ref <150)

## 2019-07-06 LAB — CBC WITH DIFFERENTIAL/PLATELET
Absolute Monocytes: 1115 cells/uL — ABNORMAL HIGH (ref 200–950)
Basophils Absolute: 136 cells/uL (ref 0–200)
Basophils Relative: 1 %
Eosinophils Absolute: 517 cells/uL — ABNORMAL HIGH (ref 15–500)
Eosinophils Relative: 3.8 %
HCT: 49.6 % (ref 38.5–50.0)
Hemoglobin: 17.6 g/dL — ABNORMAL HIGH (ref 13.2–17.1)
Lymphs Abs: 3142 cells/uL (ref 850–3900)
MCH: 32.7 pg (ref 27.0–33.0)
MCHC: 35.5 g/dL (ref 32.0–36.0)
MCV: 92 fL (ref 80.0–100.0)
MPV: 9.1 fL (ref 7.5–12.5)
Monocytes Relative: 8.2 %
Neutro Abs: 8690 cells/uL — ABNORMAL HIGH (ref 1500–7800)
Neutrophils Relative %: 63.9 %
Platelets: 520 10*3/uL — ABNORMAL HIGH (ref 140–400)
RBC: 5.39 10*6/uL (ref 4.20–5.80)
RDW: 12.7 % (ref 11.0–15.0)
Total Lymphocyte: 23.1 %
WBC: 13.6 10*3/uL — ABNORMAL HIGH (ref 3.8–10.8)

## 2019-07-06 LAB — HEMOGLOBIN A1C
Hgb A1c MFr Bld: 7.8 % of total Hgb — ABNORMAL HIGH (ref <5.7)
Mean Plasma Glucose: 177 (calc)
eAG (mmol/L): 9.8 (calc)

## 2019-07-06 LAB — TSH: TSH: 1.29 mIU/L (ref 0.40–4.50)

## 2019-07-19 ENCOUNTER — Ambulatory Visit: Payer: BC Managed Care – PPO

## 2019-07-19 ENCOUNTER — Other Ambulatory Visit: Payer: Self-pay

## 2019-07-19 VITALS — BP 124/80 | HR 77 | Temp 97.9°F | Ht 69.0 in | Wt 233.0 lb

## 2019-07-19 DIAGNOSIS — Z79899 Other long term (current) drug therapy: Secondary | ICD-10-CM

## 2019-07-19 DIAGNOSIS — E291 Testicular hypofunction: Secondary | ICD-10-CM

## 2019-07-19 MED ORDER — TESTOSTERONE CYPIONATE 200 MG/ML IM SOLN
200.0000 mg | INTRAMUSCULAR | Status: DC
  Administered 2019-07-19: 200 mg via INTRAMUSCULAR

## 2019-08-02 ENCOUNTER — Ambulatory Visit: Payer: BC Managed Care – PPO

## 2019-08-09 ENCOUNTER — Ambulatory Visit: Payer: BC Managed Care – PPO

## 2019-08-09 ENCOUNTER — Other Ambulatory Visit: Payer: Self-pay

## 2019-08-09 VITALS — BP 126/88 | HR 100 | Temp 97.3°F | Wt 226.0 lb

## 2019-08-09 DIAGNOSIS — E291 Testicular hypofunction: Secondary | ICD-10-CM

## 2019-08-09 MED ORDER — TESTOSTERONE CYPIONATE 200 MG/ML IM SOLN
200.0000 mg | INTRAMUSCULAR | Status: DC
  Administered 2019-08-09: 200 mg via INTRAMUSCULAR

## 2019-08-09 NOTE — Progress Notes (Signed)
REPORTS FOR TESTO INJECTION GIVEN--RIGHT GLUTE VITALS ENTERED

## 2019-08-14 ENCOUNTER — Other Ambulatory Visit: Payer: Self-pay | Admitting: Internal Medicine

## 2019-08-14 DIAGNOSIS — E349 Endocrine disorder, unspecified: Secondary | ICD-10-CM

## 2019-08-23 ENCOUNTER — Ambulatory Visit: Payer: BC Managed Care – PPO | Admitting: *Deleted

## 2019-08-23 ENCOUNTER — Other Ambulatory Visit: Payer: Self-pay

## 2019-08-23 VITALS — BP 116/72 | HR 96 | Temp 97.2°F | Resp 16 | Wt 232.6 lb

## 2019-08-23 DIAGNOSIS — E291 Testicular hypofunction: Secondary | ICD-10-CM

## 2019-08-23 MED ORDER — TESTOSTERONE CYPIONATE 200 MG/ML IM SOLN
400.0000 mg | Freq: Once | INTRAMUSCULAR | Status: AC
  Administered 2019-08-23: 200 mg via INTRAMUSCULAR

## 2019-08-23 NOTE — Progress Notes (Signed)
Patient is here for his Testosterone Cypionate 200 mg/ml 1 ml IM injection in left upper outer quadrant. Patient tolerated well and will return in 2 weeks for his next injection.

## 2019-08-29 ENCOUNTER — Encounter: Payer: Self-pay | Admitting: Internal Medicine

## 2019-08-29 NOTE — Patient Instructions (Signed)

## 2019-08-29 NOTE — Progress Notes (Addendum)
Annual  Screening/Preventative Visit  & Comprehensive Evaluation & Examination     This very nice 49 y.o. single WM presents for a Screening /Preventative Visit & comprehensive evaluation and management of multiple medical co-morbidities.  Patient has been followed for HTN, HLD, T2_NIDDM, Testosterone Deficiency  and Vitamin D Deficiency.     HTN predates circa 2005. Patient's BP has been controlled at home.  Today's BP is at goal - 124/84. Patient denies any cardiac symptoms as chest pain, palpitations, shortness of breath, dizziness or ankle swelling.     Patient's hyperlipidemia is controlled with diet and Rosuvastatin. Patient denies myalgias or other medication SE's. Last lipids were at goal except elevated Trig's:  Lab Results  Component Value Date   CHOL 150 07/05/2019   HDL 41 07/05/2019   LDLCALC 79 07/05/2019   TRIG 210 (H) 07/05/2019   CHOLHDL 3.7 07/05/2019       Patient has hx/o Morbid Obesity (BMI 35+) and prediabetes/Insulin Resistance (A1c 4.8% / Insulin 77 / 2011 and A1c 5.0% / Insulin 133 / 2014) and then finally was started on Metformin in Aug 2019 for  T2_DM with  A1c 7.4% and patient denies reactive hypoglycemic symptoms, visual blurring, diabetic polys or paresthesias. Last A1c was not at goal:  Lab Results  Component Value Date   HGBA1C 7.8 (H) 07/05/2019                         Patient has Testosterone Deficiency ("185" / 2013 and "239" / 2015) and sometimes uses Androgel & is transitioning to Depo-Testosterone due to expense of the Gel.       Finally, patient has history of Vitamin D Deficiency ("40" / 2010) and last vitamin D was near goal:  Lab Results  Component Value Date   VD25OH 55 05/03/2019    Current Outpatient Medications on File Prior to Visit  Medication Sig  . buPROPion (WELLBUTRIN XL) 150 MG 24 hr tablet Take 1 tablet (150 mg total) by mouth every morning.  . Cholecalciferol (VITAMIN D-3) 5000 units TABS Take 5,000 Units by mouth 2 (two)  times daily.  Marland Kitchen doxycycline (VIBRAMYCIN) 100 MG capsule TAKE 1 CAPSULE BY MOUTH TWICE DAILY FOR ROSACEA  . finasteride (PROSCAR) 5 MG tablet Take 1 tablet Daily for Prostate  . lisinopril (ZESTRIL) 20 MG tablet Take 1 tablet Daily for BP  . metFORMIN (GLUCOPHAGE-XR) 500 MG 24 hr tablet Take 2 tablets 2 x /day with Meals for Diabetes  . promethazine-dextromethorphan (PROMETHAZINE-DM) 6.25-15 MG/5ML syrup Take 1 to 2 tsp enery 4 hours if needed for cough  . rosuvastatin (CRESTOR) 40 MG tablet TAKE 1 TABLET BY MOUTH EVERY DAY  . tadalafil (CIALIS) 20 MG tablet Take 1/2 to 1 tablet every 2 to 3 days as needed for XXXX  . testosterone cypionate (DEPOTESTOSTERONE CYPIONATE) 200 MG/ML injection INJECT 1ML INTO MUSCLE EVERY WEEK  . phentermine (ADIPEX-P) 37.5 MG tablet Take 1/2 to 1 tablet Daily for Dieting & Weight Loss (Patient not taking: Reported on 08/30/2019)   No current facility-administered medications on file prior to visit.   No Known Allergies   Past Medical History:  Diagnosis Date  . 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 Hyperlipidemia   . Hypertension   . Hypogonadism male   . Insulin resistance   . Pre-diabetes    Health Maintenance  Topic Date Due  . OPHTHALMOLOGY EXAM  Never done  . COVID-19 Vaccine (1) Never done  .  INFLUENZA VACCINE  11/26/2019  . HEMOGLOBIN A1C  01/05/2020  . FOOT EXAM  08/28/2020  . TETANUS/TDAP  05/09/2024  . PNEUMOCOCCAL POLYSACCHARIDE VACCINE AGE 66-64 HIGH RISK  Completed  . HIV Screening  Completed   Immunization History  Administered Date(s) Administered  . PPD Test 05/08/2013, 05/09/2014, 05/27/2015, 06/15/2016, 07/29/2017, 08/18/2018  . Pneumococcal Conjugate-13 07/29/2017  . Pneumococcal Polysaccharide-23 01/08/2010, 05/09/2014  . Td 10/20/2004  . Tdap 05/09/2014  . Zoster 06/05/2015    Past Surgical History:  Procedure Laterality Date  . SPLENECTOMY     Family History  Problem Relation Age of Onset  . Diabetes  Mother   . Hypertension Mother   . Heart disease Mother   . Hypertension Father   . Cancer Father    Social History   Socioeconomic History  . Marital status: Single    Spouse name: Not on file  . Number of children: Not on file  . Years of education: Not on file  . Highest education level: Not on file  Occupational History  . [employer]     ROS Constitutional: Denies fever, chills, weight loss/gain, headaches, insomnia,  night sweats or change in appetite. Does c/o fatigue. Eyes: Denies redness, blurred vision, diplopia, discharge, itchy or watery eyes.  ENT: Denies discharge, congestion, post nasal drip, epistaxis, sore throat, earache, hearing loss, dental pain, Tinnitus, Vertigo, Sinus pain or snoring.  Cardio: Denies chest pain, palpitations, irregular heartbeat, syncope, dyspnea, diaphoresis, orthopnea, PND, claudication or edema Respiratory: denies cough, dyspnea, DOE, pleurisy, hoarseness, laryngitis or wheezing.  Gastrointestinal: Denies dysphagia, heartburn, reflux, water brash, pain, cramps, nausea, vomiting, bloating, diarrhea, constipation, hematemesis, melena, hematochezia, jaundice or hemorrhoids Genitourinary: Denies dysuria, frequency, urgency, nocturia, hesitancy, discharge, hematuria or flank pain Musculoskeletal: Denies arthralgia, myalgia, stiffness, Jt. Swelling, pain, limp or strain/sprain. Denies Falls. Skin: Denies puritis, rash, hives, warts, acne, eczema or change in skin lesion Neuro: No weakness, tremor, incoordination, spasms, paresthesia or pain Psychiatric: Denies confusion, memory loss or sensory loss. Denies Depression. Endocrine: Denies change in weight, skin, hair change, nocturia, and paresthesia, diabetic polys, visual blurring or hyper / hypo glycemic episodes.  Heme/Lymph: No excessive bleeding, bruising or enlarged lymph nodes.  Physical Exam  BP 124/84   Pulse 84   Temp (!) 97 F (36.1 C)   Resp 16   Ht  5' 9.5" (1.765 m)   Wt 230 lb 9.6 oz (104.6 kg)   BMI 33.57 kg/m   General Appearance: Well nourished and well groomed and in no apparent distress.  Eyes: PERRLA, EOMs, conjunctiva no swelling or erythema, normal fundi and vessels. Sinuses: No frontal/maxillary tenderness ENT/Mouth: EACs patent / TMs  nl. Nares clear without erythema, swelling, mucoid exudates. Oral hygiene is good. No erythema, swelling, or exudate. Tongue normal, non-obstructing. Tonsils not swollen or erythematous. Hearing normal.  Neck: Supple, thyroid not palpable. No bruits, nodes or JVD. Respiratory: Respiratory effort normal.  BS equal and clear bilateral without rales, rhonci, wheezing or stridor. Cardio: Heart sounds are normal with regular rate and rhythm and no murmurs, rubs or gallops. Peripheral pulses are normal and equal bilaterally without edema. No aortic or femoral bruits. Chest: symmetric with normal excursions and percussion.  Abdomen: Soft, with Nl bowel sounds. Nontender, no guarding, rebound, hernias, masses, or organomegaly.  Lymphatics: Non tender without lymphadenopathy.  Musculoskeletal: Full ROM all peripheral extremities, joint stability, 5/5 strength, and normal gait. Skin: Warm and dry without rashes, lesions, cyanosis, clubbing or  ecchymosis.  Neuro: Cranial  nerves intact, reflexes equal bilaterally. Normal muscle tone, no cerebellar symptoms. Sensation intact.  Pysch: Alert and oriented X 3 with normal affect, insight and judgment appropriate.   Assessment and Plan  1. Annual Preventative/Screening Exam   2. Essential hypertension  - EKG 12-Lead - Urinalysis, Routine w reflex microscopic - Microalbumin / creatinine urine ratio - CBC with Differential/Platelet - COMPLETE METABOLIC PANEL WITH GFR - Magnesium - TSH  3. Hyperlipidemia associated with type 2 diabetes mellitus (HCC)  - EKG 12-Lead - Lipid panel - TSH  4. Type 2 diabetes mellitus with stage 2 chronic kidney  disease,  without long-term current use of insulin (HCC)  - EKG 12-Lead - Urinalysis, Routine w reflex microscopic - Microalbumin / creatinine urine ratio - HM DIABETES FOOT EXAM - LOW EXTREMITY NEUR EXAM DOCUM - Hemoglobin A1c - Insulin, random  5. Hereditary spherocytosis (HCC)  - CBC with Differential/Platelet  6. Vitamin D deficiency  - VITAMIN D 25 Hydroxy  7. Testosterone deficiency  - Testosterone  8. H/O splenectomy  - CBC with Differential/Platelet  9. Screening for colorectal cancer  - POC Hemoccult Bld/Stl   10. Prostate cancer screening  - PSA  11. Screening for ischemic heart disease  - EKG 12-Lead  12. FHx: heart disease  - EKG 12-Lead  13. Screening examination for pulmonary tuberculosis  - TB Skin Test  14. Fatigue, unspecified type  - Iron,Total/Total Iron Binding Cap - Vitamin B12 - CBC with Differential/Platelet - TSH  15. Medication management  - Urinalysis, Routine w reflex microscopic - Microalbumin / creatinine urine ratio - Testosterone  16. CKD stage 2 due to type 2 diabetes mellitus (HCC)   17. Morbid obesity (BMI 34.31 - phentermine  37.5 MG tablet; Take 1/2 to 1 tablet Daily   Disp: 90 tab; Refill: 0         Patient was counseled in prudent diet, weight control to achieve/maintain BMI less than 25, BP monitoring, regular exercise and medications as discussed.  Discussed med effects and SE's. Routine screening labs and tests as requested with regular follow-up as recommended. Over 40 minutes of exam, counseling, chart review and high complex critical decision making was performed   Marinus Maw, MD

## 2019-08-30 ENCOUNTER — Ambulatory Visit (INDEPENDENT_AMBULATORY_CARE_PROVIDER_SITE_OTHER): Payer: BC Managed Care – PPO | Admitting: Internal Medicine

## 2019-08-30 ENCOUNTER — Other Ambulatory Visit: Payer: Self-pay

## 2019-08-30 VITALS — BP 124/84 | HR 84 | Temp 97.0°F | Resp 16 | Ht 69.5 in | Wt 230.6 lb

## 2019-08-30 DIAGNOSIS — Z1389 Encounter for screening for other disorder: Secondary | ICD-10-CM

## 2019-08-30 DIAGNOSIS — Z0000 Encounter for general adult medical examination without abnormal findings: Secondary | ICD-10-CM

## 2019-08-30 DIAGNOSIS — E559 Vitamin D deficiency, unspecified: Secondary | ICD-10-CM | POA: Diagnosis not present

## 2019-08-30 DIAGNOSIS — N401 Enlarged prostate with lower urinary tract symptoms: Secondary | ICD-10-CM

## 2019-08-30 DIAGNOSIS — Z8249 Family history of ischemic heart disease and other diseases of the circulatory system: Secondary | ICD-10-CM | POA: Diagnosis not present

## 2019-08-30 DIAGNOSIS — Z131 Encounter for screening for diabetes mellitus: Secondary | ICD-10-CM

## 2019-08-30 DIAGNOSIS — I10 Essential (primary) hypertension: Secondary | ICD-10-CM | POA: Diagnosis not present

## 2019-08-30 DIAGNOSIS — Z111 Encounter for screening for respiratory tuberculosis: Secondary | ICD-10-CM | POA: Diagnosis not present

## 2019-08-30 DIAGNOSIS — Z13220 Encounter for screening for lipoid disorders: Secondary | ICD-10-CM

## 2019-08-30 DIAGNOSIS — E1122 Type 2 diabetes mellitus with diabetic chronic kidney disease: Secondary | ICD-10-CM

## 2019-08-30 DIAGNOSIS — Z136 Encounter for screening for cardiovascular disorders: Secondary | ICD-10-CM | POA: Diagnosis not present

## 2019-08-30 DIAGNOSIS — Z1329 Encounter for screening for other suspected endocrine disorder: Secondary | ICD-10-CM | POA: Diagnosis not present

## 2019-08-30 DIAGNOSIS — Z0001 Encounter for general adult medical examination with abnormal findings: Secondary | ICD-10-CM

## 2019-08-30 DIAGNOSIS — Z1211 Encounter for screening for malignant neoplasm of colon: Secondary | ICD-10-CM

## 2019-08-30 DIAGNOSIS — Z125 Encounter for screening for malignant neoplasm of prostate: Secondary | ICD-10-CM | POA: Diagnosis not present

## 2019-08-30 DIAGNOSIS — R350 Frequency of micturition: Secondary | ICD-10-CM

## 2019-08-30 DIAGNOSIS — Z130 Encounter for screening for diseases of the blood and blood-forming organs and certain disorders involving the immune mechanism: Secondary | ICD-10-CM | POA: Diagnosis not present

## 2019-08-30 DIAGNOSIS — Z79899 Other long term (current) drug therapy: Secondary | ICD-10-CM | POA: Diagnosis not present

## 2019-08-30 DIAGNOSIS — E1169 Type 2 diabetes mellitus with other specified complication: Secondary | ICD-10-CM

## 2019-08-30 DIAGNOSIS — D580 Hereditary spherocytosis: Secondary | ICD-10-CM

## 2019-08-30 DIAGNOSIS — Z9081 Acquired absence of spleen: Secondary | ICD-10-CM

## 2019-08-30 DIAGNOSIS — E349 Endocrine disorder, unspecified: Secondary | ICD-10-CM

## 2019-08-30 DIAGNOSIS — E785 Hyperlipidemia, unspecified: Secondary | ICD-10-CM

## 2019-08-30 DIAGNOSIS — R5383 Other fatigue: Secondary | ICD-10-CM

## 2019-08-30 DIAGNOSIS — N182 Chronic kidney disease, stage 2 (mild): Secondary | ICD-10-CM

## 2019-08-30 MED ORDER — PHENTERMINE HCL 37.5 MG PO TABS: ORAL_TABLET | ORAL | 0 refills | Status: DC

## 2019-08-31 ENCOUNTER — Other Ambulatory Visit: Payer: BC Managed Care – PPO

## 2019-08-31 ENCOUNTER — Ambulatory Visit: Payer: BC Managed Care – PPO | Admitting: Hematology & Oncology

## 2019-08-31 LAB — COMPLETE METABOLIC PANEL WITH GFR
AG Ratio: 1.8 (calc) (ref 1.0–2.5)
ALT: 32 U/L (ref 9–46)
AST: 21 U/L (ref 10–40)
Albumin: 4.2 g/dL (ref 3.6–5.1)
Alkaline phosphatase (APISO): 66 U/L (ref 36–130)
BUN: 9 mg/dL (ref 7–25)
CO2: 25 mmol/L (ref 20–32)
Calcium: 9.2 mg/dL (ref 8.6–10.3)
Chloride: 99 mmol/L (ref 98–110)
Creat: 1.07 mg/dL (ref 0.60–1.35)
GFR, Est African American: 95 mL/min/{1.73_m2} (ref >=60)
GFR, Est Non African American: 82 mL/min/{1.73_m2} (ref >=60)
Globulin: 2.3 g/dL (calc) (ref 1.9–3.7)
Glucose, Bld: 316 mg/dL — ABNORMAL HIGH (ref 65–99)
Potassium: 4.5 mmol/L (ref 3.5–5.3)
Sodium: 133 mmol/L — ABNORMAL LOW (ref 135–146)
Total Bilirubin: 0.8 mg/dL (ref 0.2–1.2)
Total Protein: 6.5 g/dL (ref 6.1–8.1)

## 2019-08-31 LAB — CBC WITH DIFFERENTIAL/PLATELET
Absolute Monocytes: 1181 cells/uL — ABNORMAL HIGH (ref 200–950)
Basophils Absolute: 144 cells/uL (ref 0–200)
Basophils Relative: 1 %
Eosinophils Absolute: 346 cells/uL (ref 15–500)
Eosinophils Relative: 2.4 %
HCT: 47.1 % (ref 38.5–50.0)
Hemoglobin: 16.5 g/dL (ref 13.2–17.1)
Lymphs Abs: 2923 cells/uL (ref 850–3900)
MCH: 33.1 pg — ABNORMAL HIGH (ref 27.0–33.0)
MCHC: 35 g/dL (ref 32.0–36.0)
MCV: 94.6 fL (ref 80.0–100.0)
MPV: 9 fL (ref 7.5–12.5)
Monocytes Relative: 8.2 %
Neutro Abs: 9806 cells/uL — ABNORMAL HIGH (ref 1500–7800)
Neutrophils Relative %: 68.1 %
Platelets: 480 10*3/uL — ABNORMAL HIGH (ref 140–400)
RBC: 4.98 10*6/uL (ref 4.20–5.80)
RDW: 13 % (ref 11.0–15.0)
Total Lymphocyte: 20.3 %
WBC: 14.4 10*3/uL — ABNORMAL HIGH (ref 3.8–10.8)

## 2019-08-31 LAB — IRON, TOTAL/TOTAL IRON BINDING CAP
%SAT: 37 % (calc) (ref 20–48)
Iron: 126 ug/dL (ref 50–180)
TIBC: 341 mcg/dL (calc) (ref 250–425)

## 2019-08-31 LAB — HEMOGLOBIN A1C
Hgb A1c MFr Bld: 7.2 % of total Hgb — ABNORMAL HIGH (ref <5.7)
Mean Plasma Glucose: 160 (calc)
eAG (mmol/L): 8.9 (calc)

## 2019-08-31 LAB — URINALYSIS, ROUTINE W REFLEX MICROSCOPIC
Bilirubin Urine: NEGATIVE
Hgb urine dipstick: NEGATIVE
Ketones, ur: NEGATIVE
Leukocytes,Ua: NEGATIVE
Nitrite: NEGATIVE
Protein, ur: NEGATIVE
Specific Gravity, Urine: 1.036 — ABNORMAL HIGH (ref 1.001–1.03)
pH: <=5 (ref 5.0–8.0)

## 2019-08-31 LAB — LIPID PANEL
Cholesterol: 141 mg/dL (ref <200)
HDL: 35 mg/dL — ABNORMAL LOW (ref >=40)
LDL Cholesterol (Calc): 76 mg/dL (calc)
Non-HDL Cholesterol (Calc): 106 mg/dL (calc) (ref <130)
Total CHOL/HDL Ratio: 4 (calc) (ref <5.0)
Triglycerides: 198 mg/dL — ABNORMAL HIGH (ref <150)

## 2019-08-31 LAB — TESTOSTERONE: Testosterone: 753 ng/dL (ref 250–827)

## 2019-08-31 LAB — MICROALBUMIN / CREATININE URINE RATIO
Creatinine, Urine: 94 mg/dL (ref 20–320)
Microalb Creat Ratio: 5 mcg/mg creat (ref <30)
Microalb, Ur: 0.5 mg/dL

## 2019-08-31 LAB — MAGNESIUM: Magnesium: 1.7 mg/dL (ref 1.5–2.5)

## 2019-08-31 LAB — VITAMIN B12: Vitamin B-12: 629 pg/mL (ref 200–1100)

## 2019-08-31 LAB — PSA: PSA: 0.5 ng/mL (ref <=4.0)

## 2019-08-31 LAB — TSH: TSH: 1.34 mIU/L (ref 0.40–4.50)

## 2019-08-31 LAB — INSULIN, RANDOM: Insulin: 10.8 u[IU]/mL

## 2019-08-31 LAB — VITAMIN D 25 HYDROXY (VIT D DEFICIENCY, FRACTURES): Vit D, 25-Hydroxy: 65 ng/mL (ref 30–100)

## 2019-09-01 LAB — TB SKIN TEST
Induration: 0 mm
TB Skin Test: NEGATIVE

## 2019-09-06 ENCOUNTER — Other Ambulatory Visit: Payer: Self-pay

## 2019-09-06 ENCOUNTER — Ambulatory Visit (INDEPENDENT_AMBULATORY_CARE_PROVIDER_SITE_OTHER): Payer: BC Managed Care – PPO | Admitting: *Deleted

## 2019-09-06 VITALS — BP 110/72 | HR 96 | Temp 97.2°F | Resp 16 | Wt 233.0 lb

## 2019-09-06 DIAGNOSIS — E349 Endocrine disorder, unspecified: Secondary | ICD-10-CM | POA: Diagnosis not present

## 2019-09-06 MED ORDER — TESTOSTERONE CYPIONATE 200 MG/ML IM SOLN
400.0000 mg | Freq: Once | INTRAMUSCULAR | Status: AC
  Administered 2019-09-06: 200 mg via INTRAMUSCULAR

## 2019-09-06 NOTE — Progress Notes (Signed)
Patient is here for a NV for his Testosterone Cypionate 200 mg/ml 1 cc IM injection in his right upper outer quadrant. Patient tolerated well and will return in 2 weeks for his next injection.

## 2019-09-13 ENCOUNTER — Ambulatory Visit: Payer: BC Managed Care – PPO

## 2019-09-17 ENCOUNTER — Other Ambulatory Visit: Payer: Self-pay | Admitting: Internal Medicine

## 2019-09-17 DIAGNOSIS — E119 Type 2 diabetes mellitus without complications: Secondary | ICD-10-CM

## 2019-09-17 MED ORDER — METFORMIN HCL ER 500 MG PO TB24: ORAL_TABLET | ORAL | 0 refills | Status: DC

## 2019-09-19 ENCOUNTER — Ambulatory Visit: Payer: BC Managed Care – PPO | Admitting: *Deleted

## 2019-09-19 ENCOUNTER — Other Ambulatory Visit: Payer: Self-pay

## 2019-09-19 VITALS — BP 130/82 | HR 80 | Temp 97.4°F | Resp 16 | Wt 230.8 lb

## 2019-09-19 DIAGNOSIS — E349 Endocrine disorder, unspecified: Secondary | ICD-10-CM | POA: Diagnosis not present

## 2019-09-19 MED ORDER — TESTOSTERONE CYPIONATE 200 MG/ML IM SOLN
400.0000 mg | Freq: Once | INTRAMUSCULAR | Status: AC
Start: 2019-09-19 — End: 2019-09-19
  Administered 2019-09-19: 200 mg via INTRAMUSCULAR

## 2019-09-19 NOTE — Progress Notes (Signed)
Patient is here for a NV for his Testosterone Cypionate 200 mg/ml injection 1 ml IM in left upper outer quadrant. Patient tolerated well and will return in 2 weeks for hs next injection.

## 2019-09-20 ENCOUNTER — Ambulatory Visit: Payer: BC Managed Care – PPO

## 2019-09-20 ENCOUNTER — Other Ambulatory Visit: Payer: Self-pay | Admitting: Internal Medicine

## 2019-09-20 MED ORDER — TADALAFIL 20 MG PO TABS: ORAL_TABLET | ORAL | 0 refills | Status: DC

## 2019-09-27 ENCOUNTER — Ambulatory Visit: Payer: BC Managed Care – PPO | Admitting: Hematology & Oncology

## 2019-09-27 ENCOUNTER — Other Ambulatory Visit: Payer: BC Managed Care – PPO

## 2019-10-05 ENCOUNTER — Other Ambulatory Visit: Payer: Self-pay

## 2019-10-05 ENCOUNTER — Ambulatory Visit: Payer: BC Managed Care – PPO

## 2019-10-05 VITALS — BP 122/86 | HR 90 | Temp 97.3°F | Wt 229.2 lb

## 2019-10-05 DIAGNOSIS — E349 Endocrine disorder, unspecified: Secondary | ICD-10-CM

## 2019-10-05 MED ORDER — TESTOSTERONE CYPIONATE 200 MG/ML IM SOLN
200.0000 mg | Freq: Once | INTRAMUSCULAR | Status: AC
  Administered 2019-10-05: 200 mg via INTRAMUSCULAR

## 2019-10-05 NOTE — Progress Notes (Signed)
Patient is here for a NV for his Testosterone Cypionate 200 mg/ml injection 1 ml IM in right upper outer quadrant. Patient tolerated well and will return in 2 weeks for hs next injection

## 2019-10-16 ENCOUNTER — Other Ambulatory Visit: Payer: Self-pay | Admitting: Internal Medicine

## 2019-10-16 DIAGNOSIS — E119 Type 2 diabetes mellitus without complications: Secondary | ICD-10-CM

## 2019-10-16 MED ORDER — METFORMIN HCL ER 500 MG PO TB24: ORAL_TABLET | ORAL | 0 refills | Status: DC

## 2019-10-18 ENCOUNTER — Ambulatory Visit (INDEPENDENT_AMBULATORY_CARE_PROVIDER_SITE_OTHER): Payer: BC Managed Care – PPO | Admitting: *Deleted

## 2019-10-18 ENCOUNTER — Other Ambulatory Visit: Payer: Self-pay

## 2019-10-18 VITALS — BP 108/72 | HR 92 | Temp 97.6°F | Resp 16 | Ht 69.5 in | Wt 224.2 lb

## 2019-10-18 DIAGNOSIS — E349 Endocrine disorder, unspecified: Secondary | ICD-10-CM | POA: Diagnosis not present

## 2019-10-18 MED ORDER — TESTOSTERONE CYPIONATE 200 MG/ML IM SOLN
400.0000 mg | Freq: Once | INTRAMUSCULAR | Status: AC
  Administered 2019-10-18: 400 mg via INTRAMUSCULAR

## 2019-10-18 NOTE — Progress Notes (Signed)
Patient here for a NV for Testosterone Cypionate 200 mg/ml 1 ml IM in left upper outer quadrant. Patient tolerated week and will return in 2 weeks for his next shot.

## 2019-10-19 ENCOUNTER — Ambulatory Visit: Payer: BC Managed Care – PPO

## 2019-10-26 ENCOUNTER — Other Ambulatory Visit: Payer: Self-pay | Admitting: Internal Medicine

## 2019-10-26 DIAGNOSIS — E349 Endocrine disorder, unspecified: Secondary | ICD-10-CM

## 2019-11-02 ENCOUNTER — Ambulatory Visit (INDEPENDENT_AMBULATORY_CARE_PROVIDER_SITE_OTHER): Payer: BC Managed Care – PPO | Admitting: *Deleted

## 2019-11-02 ENCOUNTER — Other Ambulatory Visit: Payer: Self-pay

## 2019-11-02 VITALS — BP 114/80 | HR 92 | Temp 97.2°F | Resp 16 | Wt 223.8 lb

## 2019-11-02 DIAGNOSIS — E349 Endocrine disorder, unspecified: Secondary | ICD-10-CM

## 2019-11-02 MED ORDER — TESTOSTERONE CYPIONATE 200 MG/ML IM SOLN
400.0000 mg | Freq: Once | INTRAMUSCULAR | Status: AC
  Administered 2019-11-02: 200 mg via INTRAMUSCULAR

## 2019-11-02 NOTE — Progress Notes (Signed)
Patient is here for a NV to receive his Testosterone Cypionate 200 mg/ml 1 ml IM injection in his right upper outer quadrant. Patient tolerated well and will return in 2 weeks for his next injection.

## 2019-11-16 ENCOUNTER — Other Ambulatory Visit: Payer: Self-pay

## 2019-11-16 ENCOUNTER — Ambulatory Visit: Payer: BC Managed Care – PPO | Admitting: *Deleted

## 2019-11-16 VITALS — BP 122/84 | HR 92 | Temp 97.2°F | Resp 16 | Wt 221.8 lb

## 2019-11-16 DIAGNOSIS — E349 Endocrine disorder, unspecified: Secondary | ICD-10-CM

## 2019-11-16 MED ORDER — TESTOSTERONE CYPIONATE 200 MG/ML IM SOLN
400.0000 mg | Freq: Once | INTRAMUSCULAR | Status: AC
  Administered 2019-11-16: 200 mg via INTRAMUSCULAR

## 2019-11-16 NOTE — Progress Notes (Signed)
Patient here for his Testosterone cypionate 200 mg/ml 1 ml IM injection in left upper outer quadrant. Patient will return I 2 weeks for his next shot.

## 2019-11-21 ENCOUNTER — Other Ambulatory Visit: Payer: Self-pay | Admitting: Internal Medicine

## 2019-11-29 ENCOUNTER — Other Ambulatory Visit: Payer: Self-pay | Admitting: Internal Medicine

## 2019-11-29 NOTE — Progress Notes (Signed)
FOLLOW UP  Assessment and Plan:   Hypertension Well controlled with current medications  Monitor blood pressure at home; patient to call if consistently greater than 130/80 Continue DASH diet.  Reminder to go to the ER if any CP, SOB, nausea, dizziness, severe HA, changes vision/speech, left arm numbness and tingling and jaw pain  Hyperlipidemia associated with T2DM (HCC) Currently above LDL goal; taking rosuvastatin 40 mg daily  Titrate for LDL goal <70 Continue low cholesterol diet and exercise.  Check lipid panel.   T2DM with complications (HCC) New diabetic as of 2019; on metformin 1000 mg daily -increase back up to 2000 mg if tolerating Strong patient preference to add ozempic for glucose and weight loss benefits; discussed at length, will start trial with 4 weeks 0.25 mg dose and 4 weeks 0.5 mg dose.  working on lifestyle and weight loss  Goal to reverse and get off of medications Continue diet and exercise.  Perform daily foot/skin check, notify office of any concerning changes.  Check A1C  CKD II associated with T2DM (HCC) Increase fluids, avoid NSAIDS, monitor sugars, will monitor  ED associated with T2DM (HCC) Continue testosterone, PRN cialis  Control blood pressure, cholesterol, glucose, increase exercise.   Obesity with co morbidities Long discussion about weight loss, diet, and exercise Recommended diet heavy in fruits and veggies and low in animal meats, cheeses, and dairy products, appropriate calorie intake Discussed ideal weight for height  Patient will work on cutting down soda; he feels reasonable to cut this out fully  Phentermine - has been helpful Weight goal <200 lb Will follow up in 3 months  Vitamin D Def Essentially at goal of 60 at last visit; taking 35329 IU daily  Defer Vit D level  Hypogonadism - continue replacement therapy, check testosterone levels as needed.  Due today - shot administered by [facility]MA - 200 mg IM Continue 200 mg every 2  weeks; tolerating this dose with erythrocytosis improved Encouraged weight loss, HIIT, zinc 50 mg daily supplement  Hereditary spherocytosis (HCC) s/p splenectomy/ erythrocytosis Followed by Dr. Myna Hidalgo Getting phlebotomies PRN; improved with lower testosterone supplement Check CBC   Continue diet and meds as discussed. Further disposition pending results of labs. Discussed med's effects and SE's.   Over 30 minutes of exam, counseling, chart review, and critical decision making was performed.   Future Appointments  Date Time Provider Department Center  12/14/2019  2:30 PM [facility] NURSE [facility] None  04/08/2020  2:30 PM [name], MD [facility] None  09/02/2020  2:00 PM [name], MD [facility] None    ----------------------------------------------------------------------------------------------------------------------  HPI 49 y.o. male  presents for 3 month follow up on hypertension, cholesterol, T2 diabetes with CKD and ED, obesity, hypogonadism and vitamin D deficiency.   he is prescribed phentermine for weight loss, down 13 lb since last OV.  Peak weight 241 lb in 07/2017.   BMI is Body mass index is 32.02 kg/m., he jas been working on diet,  Has cut down on biscuits and bread, eating yogurt, fresh fruit and protein shake, or with do eggs and sausage/bake Reports down from 7-8 sodas a day to 2 bottle, trying to cut down  Has reduced alcohol, only 1 time per month Down from 233 in 08/2019, down 13 lb  Goal is 190- 200 lb per patient  He is very interested in ozempic  Wt Readings from Last 3 Encounters:  11/30/19 220 lb (99.8 kg)  11/16/19 (!) 221 lb 12.8 oz (100.6 kg)  11/02/19 223  lb 12.8 oz (101.5 kg)   He does not currently check BP at home, doesn't have cuff, today their BP is BP: 112/80  He does not workout. He denies chest pain, shortness of breath, dizziness.   He is on cholesterol medication (rosvuastatin 40 mg) and denies myalgias. His cholesterol  is not at goal. The cholesterol last visit was:   Lab Results  Component Value Date   CHOL 141 08/30/2019   HDL 35 (L) 08/30/2019   LDLCALC 76 08/30/2019   TRIG 198 (H) 08/30/2019   CHOLHDL 4.0 08/30/2019    He has not been working on diet and exercise for T2 diabetes (since 07/2017, 8.4% as of 07/2018) now well controlled on metformin 500 mg x 2 tabs daily, with CKD II and ED and denies foot ulcerations, increased appetite, nausea, paresthesia of the feet, polydipsia, polyuria, visual disturbances, vomiting and weight loss. He is strongly requesting we initiate ozempic today.  He refuses glucometer.  Last A1C in the office was:  Lab Results  Component Value Date   HGBA1C 7.2 (H) 08/30/2019   He has stable CKD II associated with T2DM monitored via this office:  Lab Results  Component Value Date   GFRNONAA 82 08/30/2019   Patient is on Vitamin D supplement, taking 06301 IU daily:   Lab Results  Component Value Date   VD25OH 40 08/30/2019     He has a history of testosterone deficiency and is on testosterone replacement. His dose was reduced from 400 mg every 2 weeks to 200 mg every 2 weeks due to erythrocytosis which has improved. Injection is due today. He states that the testosterone helps with his energy, libido, muscle mass. He is also prescribed cialis PRN due to ED.  Lab Results  Component Value Date   TESTOSTERONE 753 08/30/2019   He has hx of splenectomy remotely due to hereditary spherocytosis; also followed for erythrocytosis, was referred to hem Dr. Myna Hidalgo per his request and had phlebotomy but monitoring only at this time due to improved since reducing testosterone dose:  Lab Results  Component Value Date   WBC 14.4 (H) 08/30/2019   HGB 16.5 08/30/2019   HCT 47.1 08/30/2019   MCV 94.6 08/30/2019   PLT 480 (H) 08/30/2019     Current Medications:  Current Outpatient Medications on File Prior to Visit  Medication Sig  . buPROPion (WELLBUTRIN XL) 150 MG 24 hr tablet  Take 1 tablet (150 mg total) by mouth every morning.  . Cholecalciferol (VITAMIN D-3) 5000 units TABS Take 5,000 Units by mouth 2 (two) times daily.  Marland Kitchen doxycycline (VIBRAMYCIN) 100 MG capsule TAKE 1 CAPSULE BY MOUTH TWICE DAILY FOR ROSACEA  . finasteride (PROSCAR) 5 MG tablet Take 1 tablet Daily for Prostate  . lisinopril (ZESTRIL) 20 MG tablet Take 1 tablet Daily for BP  . metFORMIN (GLUCOPHAGE-XR) 500 MG 24 hr tablet Take 2 tablets 2 x /day with Meals for Diabetes  . phentermine (ADIPEX-P) 37.5 MG tablet TAKE 1/2 TO 1 TABLET BY MOUTH EVERY DAY FOR DIETING AND WEIGHT LOSS  . rosuvastatin (CRESTOR) 40 MG tablet TAKE 1 TABLET BY MOUTH EVERY DAY  . tadalafil (CIALIS) 20 MG tablet Take 1/2 to 1 tablet every 2 to 3 days as needed for XXXX  . testosterone cypionate (DEPOTESTOSTERONE CYPIONATE) 200 MG/ML injection INJECT INTO MUSCLE EVERY WEEK   No current facility-administered medications on file prior to visit.     Allergies: No Known Allergies   Medical History:  Past Medical  History:  Diagnosis Date  . 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 Hyperlipidemia   . Hypertension   . Hypogonadism male   . Insulin resistance   . Pre-diabetes    Family history- Reviewed and unchanged Social history- Reviewed and unchanged   Review of Systems:  Review of Systems  Constitutional: Negative for malaise/fatigue and weight loss.  HENT: Negative for hearing loss and tinnitus.   Eyes: Negative for blurred vision and double vision.  Respiratory: Negative for cough, shortness of breath and wheezing.   Cardiovascular: Negative for chest pain, palpitations, orthopnea, claudication and leg swelling.  Gastrointestinal: Negative for abdominal pain, blood in stool, constipation, diarrhea, heartburn, melena, nausea and vomiting.  Genitourinary: Negative.   Musculoskeletal: Negative for joint pain and myalgias.  Skin: Negative for rash.  Neurological: Negative for dizziness, tingling,  sensory change, weakness and headaches.  Endo/Heme/Allergies: Negative for polydipsia.  Psychiatric/Behavioral: Negative.   All other systems reviewed and are negative.   Physical Exam: BP 112/80   Pulse 98   Temp (!) 97.5 F (36.4 C)   Wt 220 lb (99.8 kg)   SpO2 98%   BMI 32.02 kg/m  Wt Readings from Last 3 Encounters:  11/30/19 220 lb (99.8 kg)  11/16/19 (!) 221 lb 12.8 oz (100.6 kg)  11/02/19 223 lb 12.8 oz (101.5 kg)   General Appearance: Well nourished, in no apparent distress. Eyes: PERRLA, EOMs, conjunctiva no swelling or erythema Sinuses: No Frontal/maxillary tenderness ENT/Mouth: Ext aud canals clear, TMs without erythema, bulging. No erythema, swelling, or exudate on post pharynx.  Tonsils not swollen or erythematous. Hearing normal.  Neck: Supple, thyroid normal.  Respiratory: Respiratory effort normal, BS equal bilaterally without rales, rhonchi, wheezing or stridor.  Cardio: RRR with no MRGs. Brisk peripheral pulses without edema.  Abdomen: Soft, + BS.  Non tender, no guarding, rebound, hernias, masses. Lymphatics: Non tender without lymphadenopathy.  Musculoskeletal: Full ROM, 5/5 strength, Normal gait Skin: Warm, dry without rashes, lesions, ecchymosis.  Neuro: Cranial nerves intact. No cerebellar symptoms.  Psych: Awake and oriented X 3, normal affect, Insight and Judgment appropriate.    Noah Maker, NP 2:51 PM [facility]

## 2019-11-30 ENCOUNTER — Ambulatory Visit: Payer: BC Managed Care – PPO | Admitting: Adult Health

## 2019-11-30 ENCOUNTER — Other Ambulatory Visit: Payer: Self-pay

## 2019-11-30 ENCOUNTER — Encounter: Payer: Self-pay | Admitting: Adult Health

## 2019-11-30 VITALS — BP 112/80 | HR 98 | Temp 97.5°F | Wt 220.0 lb

## 2019-11-30 DIAGNOSIS — E349 Endocrine disorder, unspecified: Secondary | ICD-10-CM

## 2019-11-30 DIAGNOSIS — E1122 Type 2 diabetes mellitus with diabetic chronic kidney disease: Secondary | ICD-10-CM | POA: Diagnosis not present

## 2019-11-30 DIAGNOSIS — E785 Hyperlipidemia, unspecified: Secondary | ICD-10-CM

## 2019-11-30 DIAGNOSIS — E559 Vitamin D deficiency, unspecified: Secondary | ICD-10-CM | POA: Diagnosis not present

## 2019-11-30 DIAGNOSIS — Z79899 Other long term (current) drug therapy: Secondary | ICD-10-CM | POA: Diagnosis not present

## 2019-11-30 DIAGNOSIS — E1169 Type 2 diabetes mellitus with other specified complication: Secondary | ICD-10-CM

## 2019-11-30 DIAGNOSIS — N182 Chronic kidney disease, stage 2 (mild): Secondary | ICD-10-CM

## 2019-11-30 DIAGNOSIS — N521 Erectile dysfunction due to diseases classified elsewhere: Secondary | ICD-10-CM

## 2019-11-30 DIAGNOSIS — D580 Hereditary spherocytosis: Secondary | ICD-10-CM

## 2019-11-30 DIAGNOSIS — I10 Essential (primary) hypertension: Secondary | ICD-10-CM

## 2019-11-30 DIAGNOSIS — E669 Obesity, unspecified: Secondary | ICD-10-CM

## 2019-11-30 MED ORDER — OZEMPIC (0.25 OR 0.5 MG/DOSE) 2 MG/1.5ML ~~LOC~~ SOPN: PEN_INJECTOR | SUBCUTANEOUS | 0 refills | Status: DC

## 2019-11-30 MED ORDER — TESTOSTERONE CYPIONATE 200 MG/ML IM SOLN
200.0000 mg | Freq: Once | INTRAMUSCULAR | Status: AC
  Administered 2019-11-30: 100 mg via INTRAMUSCULAR

## 2019-11-30 NOTE — Progress Notes (Signed)
Patient is here for his Testosterone cypionate 200 mg/ml 1 ml IM injection in RIGHT upper outer quadrant. Patient will return in 2 weeks for his next shot.

## 2019-11-30 NOTE — Patient Instructions (Addendum)
Goals    . DIET - DECREASE SODA OR JUICE INTAKE    . Exercise 150 min/wk Moderate Activity    . HEMOGLOBIN A1C < 5.7    . Weight (lb) < 200 lb (90.7 kg)        Semaglutide injection solution What is this medicine? SEMAGLUTIDE (Sem a GLOO tide) is used to improve blood sugar control in adults with type 2 diabetes. This medicine may be used with other diabetes medicines. This drug may also reduce the risk of heart attack or stroke if you have type 2 diabetes and risk factors for heart disease. This medicine may be used for other purposes; ask your health care provider or pharmacist if you have questions. COMMON BRAND NAME(S): OZEMPIC What should I tell my health care provider before I take this medicine? They need to know if you have any of these conditions:  endocrine tumors (MEN 2) or if someone in your family had these tumors  eye disease, vision problems  history of pancreatitis  kidney disease  stomach problems  thyroid cancer or if someone in your family had thyroid cancer  an unusual or allergic reaction to semaglutide, other medicines, foods, dyes, or preservatives  pregnant or trying to get pregnant  breast-feeding How should I use this medicine? This medicine is for injection under the skin of your upper leg (thigh), stomach area, or upper arm. It is given once every week (every 7 days). You will be taught how to prepare and give this medicine. Use exactly as directed. Take your medicine at regular intervals. Do not take it more often than directed. If you use this medicine with insulin, you should inject this medicine and the insulin separately. Do not mix them together. Do not give the injections right next to each other. Change (rotate) injection sites with each injection. It is important that you put your used needles and syringes in a special sharps container. Do not put them in a trash can. If you do not have a sharps container, call your pharmacist or healthcare  provider to get one. A special MedGuide will be given to you by the pharmacist with each prescription and refill. Be sure to read this information carefully each time. This drug comes with INSTRUCTIONS FOR USE. Ask your pharmacist for directions on how to use this drug. Read the information carefully. Talk to your pharmacist or health care provider if you have questions. Talk to your pediatrician regarding the use of this medicine in children. Special care may be needed. Overdosage: If you think you have taken too much of this medicine contact a poison control center or emergency room at once. NOTE: This medicine is only for you. Do not share this medicine with others. What if I miss a dose? If you miss a dose, take it as soon as you can within 5 days after the missed dose. Then take your next dose at your regular weekly time. If it has been longer than 5 days after the missed dose, do not take the missed dose. Take the next dose at your regular time. Do not take double or extra doses. If you have questions about a missed dose, contact your health care provider for advice. What may interact with this medicine?  other medicines for diabetes Many medications may cause changes in blood sugar, these include:  alcohol containing beverages  antiviral medicines for HIV or AIDS  aspirin and aspirin-like drugs  certain medicines for blood pressure, heart disease, irregular heart  beat  chromium  diuretics  male hormones, such as estrogens or progestins, birth control pills  fenofibrate  gemfibrozil  isoniazid  lanreotide  male hormones or anabolic steroids  MAOIs like Carbex, Eldepryl, Marplan, Nardil, and Parnate  medicines for weight loss  medicines for allergies, asthma, cold, or cough  medicines for depression, anxiety, or psychotic disturbances  niacin  nicotine  NSAIDs, medicines for pain and inflammation, like ibuprofen or  naproxen  octreotide  pasireotide  pentamidine  phenytoin  probenecid  quinolone antibiotics such as ciprofloxacin, levofloxacin, ofloxacin  some herbal dietary supplements  steroid medicines such as prednisone or cortisone  sulfamethoxazole; trimethoprim  thyroid hormones Some medications can hide the warning symptoms of low blood sugar (hypoglycemia). You may need to monitor your blood sugar more closely if you are taking one of these medications. These include:  beta-blockers, often used for high blood pressure or heart problems (examples include atenolol, metoprolol, propranolol)  clonidine  guanethidine  reserpine This list may not describe all possible interactions. Give your health care provider a list of all the medicines, herbs, non-prescription drugs, or dietary supplements you use. Also tell them if you smoke, drink alcohol, or use illegal drugs. Some items may interact with your medicine. What should I watch for while using this medicine? Visit your doctor or health care professional for regular checks on your progress. Drink plenty of fluids while taking this medicine. Check with your doctor or health care professional if you get an attack of severe diarrhea, nausea, and vomiting. The loss of too much body fluid can make it dangerous for you to take this medicine. A test called the HbA1C (A1C) will be monitored. This is a simple blood test. It measures your blood sugar control over the last 2 to 3 months. You will receive this test every 3 to 6 months. Learn how to check your blood sugar. Learn the symptoms of low and high blood sugar and how to manage them. Always carry a quick-source of sugar with you in case you have symptoms of low blood sugar. Examples include hard sugar candy or glucose tablets. Make sure others know that you can choke if you eat or drink when you develop serious symptoms of low blood sugar, such as seizures or unconsciousness. They must get  medical help at once. Tell your doctor or health care professional if you have high blood sugar. You might need to change the dose of your medicine. If you are sick or exercising more than usual, you might need to change the dose of your medicine. Do not skip meals. Ask your doctor or health care professional if you should avoid alcohol. Many nonprescription cough and cold products contain sugar or alcohol. These can affect blood sugar. Pens should never be shared. Even if the needle is changed, sharing may result in passing of viruses like hepatitis or HIV. Wear a medical ID bracelet or chain, and carry a card that describes your disease and details of your medicine and dosage times. Do not become pregnant while taking this medicine. Women should inform their doctor if they wish to become pregnant or think they might be pregnant. There is a potential for serious side effects to an unborn child. Talk to your health care professional or pharmacist for more information. What side effects may I notice from receiving this medicine? Side effects that you should report to your doctor or health care professional as soon as possible:  allergic reactions like skin rash, itching or hives,  swelling of the face, lips, or tongue  breathing problems  changes in vision  diarrhea that continues or is severe  lump or swelling on the neck  severe nausea  signs and symptoms of infection like fever or chills; cough; sore throat; pain or trouble passing urine  signs and symptoms of low blood sugar such as feeling anxious, confusion, dizziness, increased hunger, unusually weak or tired, sweating, shakiness, cold, irritable, headache, blurred vision, fast heartbeat, loss of consciousness  signs and symptoms of kidney injury like trouble passing urine or change in the amount of urine  trouble swallowing  unusual stomach upset or pain  vomiting Side effects that usually do not require medical attention  (report to your doctor or health care professional if they continue or are bothersome):  constipation  diarrhea  nausea  pain, redness, or irritation at site where injected  stomach upset This list may not describe all possible side effects. Call your doctor for medical advice about side effects. You may report side effects to FDA at 1-800-FDA-1088. Where should I keep my medicine? Keep out of the reach of children. Store unopened pens in a refrigerator between 2 and 8 degrees C (36 and 46 degrees F). Do not freeze. Protect from light and heat. After you first use the pen, it can be stored for 56 days at room temperature between 15 and 30 degrees C (59 and 86 degrees F) or in a refrigerator. Throw away your used pen after 56 days or after the expiration date, whichever comes first. Do not store your pen with the needle attached. If the needle is left on, medicine may leak from the pen. NOTE: This sheet is a summary. It may not cover all possible information. If you have questions about this medicine, talk to your doctor, pharmacist, or health care provider.  2020 Elsevier/Gold Standard (2018-12-27 09:41:51)

## 2019-12-01 LAB — COMPLETE METABOLIC PANEL WITH GFR
AG Ratio: 1.6 (calc) (ref 1.0–2.5)
ALT: 32 U/L (ref 9–46)
AST: 22 U/L (ref 10–40)
Albumin: 4.6 g/dL (ref 3.6–5.1)
Alkaline phosphatase (APISO): 66 U/L (ref 36–130)
BUN: 13 mg/dL (ref 7–25)
CO2: 29 mmol/L (ref 20–32)
Calcium: 10.5 mg/dL — ABNORMAL HIGH (ref 8.6–10.3)
Chloride: 101 mmol/L (ref 98–110)
Creat: 1.21 mg/dL (ref 0.60–1.35)
GFR, Est African American: 81 mL/min/{1.73_m2} (ref >=60)
GFR, Est Non African American: 70 mL/min/{1.73_m2} (ref >=60)
Globulin: 2.8 g/dL (calc) (ref 1.9–3.7)
Glucose, Bld: 152 mg/dL — ABNORMAL HIGH (ref 65–99)
Potassium: 4.6 mmol/L (ref 3.5–5.3)
Sodium: 137 mmol/L (ref 135–146)
Total Bilirubin: 1.2 mg/dL (ref 0.2–1.2)
Total Protein: 7.4 g/dL (ref 6.1–8.1)

## 2019-12-01 LAB — CBC WITH DIFFERENTIAL/PLATELET
Absolute Monocytes: 1179 cells/uL — ABNORMAL HIGH (ref 200–950)
Basophils Absolute: 161 cells/uL (ref 0–200)
Basophils Relative: 1.2 %
Eosinophils Absolute: 415 cells/uL (ref 15–500)
Eosinophils Relative: 3.1 %
HCT: 51.2 % — ABNORMAL HIGH (ref 38.5–50.0)
Hemoglobin: 18.1 g/dL — ABNORMAL HIGH (ref 13.2–17.1)
Lymphs Abs: 3283 cells/uL (ref 850–3900)
MCH: 33.3 pg — ABNORMAL HIGH (ref 27.0–33.0)
MCHC: 35.4 g/dL (ref 32.0–36.0)
MCV: 94.1 fL (ref 80.0–100.0)
MPV: 8.9 fL (ref 7.5–12.5)
Monocytes Relative: 8.8 %
Neutro Abs: 8362 cells/uL — ABNORMAL HIGH (ref 1500–7800)
Neutrophils Relative %: 62.4 %
Platelets: 548 10*3/uL — ABNORMAL HIGH (ref 140–400)
RBC: 5.44 10*6/uL (ref 4.20–5.80)
RDW: 12.5 % (ref 11.0–15.0)
Total Lymphocyte: 24.5 %
WBC: 13.4 10*3/uL — ABNORMAL HIGH (ref 3.8–10.8)

## 2019-12-01 LAB — HEMOGLOBIN A1C
Hgb A1c MFr Bld: 7.5 % of total Hgb — ABNORMAL HIGH (ref <5.7)
Mean Plasma Glucose: 169 (calc)
eAG (mmol/L): 9.3 (calc)

## 2019-12-01 LAB — LIPID PANEL
Cholesterol: 170 mg/dL (ref <200)
HDL: 41 mg/dL (ref >=40)
LDL Cholesterol (Calc): 101 mg/dL (calc) — ABNORMAL HIGH
Non-HDL Cholesterol (Calc): 129 mg/dL (calc) (ref <130)
Total CHOL/HDL Ratio: 4.1 (calc) (ref <5.0)
Triglycerides: 180 mg/dL — ABNORMAL HIGH (ref <150)

## 2019-12-01 LAB — MAGNESIUM: Magnesium: 1.9 mg/dL (ref 1.5–2.5)

## 2019-12-01 LAB — TSH: TSH: 1.68 mIU/L (ref 0.40–4.50)

## 2019-12-14 ENCOUNTER — Ambulatory Visit (INDEPENDENT_AMBULATORY_CARE_PROVIDER_SITE_OTHER): Payer: BC Managed Care – PPO | Admitting: *Deleted

## 2019-12-14 ENCOUNTER — Other Ambulatory Visit: Payer: Self-pay

## 2019-12-14 VITALS — BP 108/82 | HR 92 | Temp 97.3°F | Wt 218.4 lb

## 2019-12-14 DIAGNOSIS — E349 Endocrine disorder, unspecified: Secondary | ICD-10-CM

## 2019-12-14 MED ORDER — TESTOSTERONE CYPIONATE 200 MG/ML IM SOLN
400.0000 mg | Freq: Once | INTRAMUSCULAR | Status: AC
  Administered 2019-12-14: 200 mg via INTRAMUSCULAR

## 2019-12-14 NOTE — Progress Notes (Signed)
Patient is here for a NV to receive his testosterone cypionate 200 mg/ml 1 ml IM injection in his left upper outer quadrant. Patient tolerated well and will return in 2 weeks for his next injection. Patient scheduled a 3 month follow up 03/04/2020, with Judd Gaudier, NP,  In regard to his diabetes.

## 2019-12-15 ENCOUNTER — Other Ambulatory Visit: Payer: Self-pay | Admitting: Internal Medicine

## 2019-12-17 ENCOUNTER — Other Ambulatory Visit: Payer: Self-pay | Admitting: Internal Medicine

## 2019-12-17 DIAGNOSIS — E349 Endocrine disorder, unspecified: Secondary | ICD-10-CM

## 2019-12-20 ENCOUNTER — Other Ambulatory Visit: Payer: Self-pay | Admitting: Internal Medicine

## 2019-12-20 DIAGNOSIS — L719 Rosacea, unspecified: Secondary | ICD-10-CM

## 2019-12-20 MED ORDER — DOXYCYCLINE HYCLATE 100 MG PO CAPS: ORAL_CAPSULE | ORAL | 0 refills | Status: DC

## 2019-12-21 ENCOUNTER — Telehealth: Payer: Self-pay | Admitting: Adult Health

## 2019-12-21 NOTE — Telephone Encounter (Signed)
concerned he will run out of needle pen. Ozempic

## 2019-12-21 NOTE — Telephone Encounter (Signed)
Called patient to let him know that I can give a sample of pen needles. Up front for pick up.

## 2019-12-27 ENCOUNTER — Ambulatory Visit: Payer: BC Managed Care – PPO | Admitting: *Deleted

## 2019-12-27 ENCOUNTER — Other Ambulatory Visit: Payer: Self-pay

## 2019-12-27 VITALS — BP 112/88 | HR 88 | Temp 97.1°F | Resp 16 | Wt 223.8 lb

## 2019-12-27 DIAGNOSIS — E349 Endocrine disorder, unspecified: Secondary | ICD-10-CM | POA: Diagnosis not present

## 2019-12-27 MED ORDER — TESTOSTERONE CYPIONATE 200 MG/ML IM SOLN
400.0000 mg | Freq: Once | INTRAMUSCULAR | Status: AC
  Administered 2019-12-27: 200 mg via INTRAMUSCULAR

## 2019-12-27 NOTE — Progress Notes (Signed)
Patient is here for his Testosterone Cypionate 200 mg/ml 1 ml IM in right upper outer quadrant. Patient tolerated well and will return in 2 weeks for his next injection.

## 2020-01-10 ENCOUNTER — Ambulatory Visit: Payer: BC Managed Care – PPO | Admitting: *Deleted

## 2020-01-10 ENCOUNTER — Other Ambulatory Visit: Payer: Self-pay

## 2020-01-10 VITALS — BP 112/80 | HR 88 | Temp 96.9°F | Resp 16 | Wt 224.2 lb

## 2020-01-10 DIAGNOSIS — E349 Endocrine disorder, unspecified: Secondary | ICD-10-CM

## 2020-01-10 MED ORDER — TESTOSTERONE CYPIONATE 200 MG/ML IM SOLN
400.0000 mg | Freq: Once | INTRAMUSCULAR | Status: AC
  Administered 2020-01-10: 200 mg via INTRAMUSCULAR

## 2020-01-10 NOTE — Progress Notes (Signed)
Patient is here for a NV to receive his Testosterone Cypionate 200 mg/ml 1 ml injection IM in left upper outer quadrant. Patient tolerated well and will return in 2 weeks for next injection.

## 2020-01-14 ENCOUNTER — Other Ambulatory Visit: Payer: Self-pay | Admitting: Internal Medicine

## 2020-01-14 DIAGNOSIS — E119 Type 2 diabetes mellitus without complications: Secondary | ICD-10-CM

## 2020-01-24 ENCOUNTER — Ambulatory Visit: Payer: BC Managed Care – PPO | Admitting: *Deleted

## 2020-01-24 ENCOUNTER — Other Ambulatory Visit: Payer: Self-pay

## 2020-01-24 VITALS — BP 116/64 | HR 85 | Temp 97.2°F | Resp 16 | Wt 229.0 lb

## 2020-01-24 DIAGNOSIS — E349 Endocrine disorder, unspecified: Secondary | ICD-10-CM | POA: Diagnosis not present

## 2020-01-24 MED ORDER — TESTOSTERONE CYPIONATE 200 MG/ML IM SOLN
400.0000 mg | Freq: Once | INTRAMUSCULAR | Status: AC
  Administered 2020-01-24: 400 mg via INTRAMUSCULAR

## 2020-01-24 NOTE — Progress Notes (Signed)
Patient here for his Testosterone Cypionate 200 mg/ml 1 ml IM injection in his right upper outer quadrant. Tolerate well and will return in 2 weeks for his next injection.

## 2020-02-07 ENCOUNTER — Other Ambulatory Visit: Payer: Self-pay

## 2020-02-07 ENCOUNTER — Ambulatory Visit: Payer: BC Managed Care – PPO

## 2020-02-07 VITALS — BP 130/90 | HR 98 | Temp 97.7°F | Wt 227.0 lb

## 2020-02-07 DIAGNOSIS — E291 Testicular hypofunction: Secondary | ICD-10-CM

## 2020-02-07 MED ORDER — TESTOSTERONE CYPIONATE 200 MG/ML IM SOLN
200.0000 mg | Freq: Once | INTRAMUSCULAR | Status: AC
  Administered 2020-02-07: 100 mg via INTRAMUSCULAR

## 2020-02-07 NOTE — Progress Notes (Signed)
Patient here for his Testosterone Cypionate 200 mg/ml 1 ml IM injection in his left upper outer quadrant. Tolerate well and will return in 2 weeks for his next injection.

## 2020-02-21 ENCOUNTER — Other Ambulatory Visit: Payer: Self-pay

## 2020-02-21 ENCOUNTER — Ambulatory Visit: Payer: BC Managed Care – PPO | Admitting: *Deleted

## 2020-02-21 VITALS — BP 114/80 | HR 93 | Temp 97.4°F | Resp 16 | Wt 230.2 lb

## 2020-02-21 DIAGNOSIS — E291 Testicular hypofunction: Secondary | ICD-10-CM

## 2020-02-21 MED ORDER — TESTOSTERONE CYPIONATE 200 MG/ML IM SOLN
400.0000 mg | Freq: Once | INTRAMUSCULAR | Status: AC
  Administered 2020-02-21: 200 mg via INTRAMUSCULAR

## 2020-02-21 NOTE — Progress Notes (Signed)
Patient is here for a NV to receive his Testosterone Cypionate 200 mg/ml 1 ml IM injection in his right upper outer quadrant. Patient tolerated well and next injection due in 2 weeks.

## 2020-02-26 ENCOUNTER — Other Ambulatory Visit: Payer: Self-pay | Admitting: Adult Health

## 2020-03-01 NOTE — Progress Notes (Signed)
FOLLOW UP  Assessment and Plan:   Hypertension Well controlled with current medications  Monitor blood pressure at home; patient to call if consistently greater than 130/80 Continue DASH diet.  Reminder to go to the ER if any CP, SOB, nausea, dizziness, severe HA, changes vision/speech, left arm numbness and tingling and jaw pain  Hyperlipidemia associated with T2DM (HCC) Currently above LDL goal; taking rosuvastatin 40 mg daily  LDL goal <70 lifestyle discussed, consider adding zetia if persistently above goal  Continue low cholesterol diet and exercise.  Check lipid panel.   T2DM with complications (HCC) New diabetic as of 2019; on metformin 1000 mg daily -increase back up to 2000 mg  Ozempic for glucose and weight loss benefits; tolerating 0.5 mg, increase to 1 mg weekly working on lifestyle and weight loss - emphasized need to STOP SODA Goal to reverse and get off of medications Continue diet and exercise.  Perform daily foot/skin check, notify office of any concerning changes.  Check A1C  CKD II associated with T2DM (HCC) Increase fluids, avoid NSAIDS, monitor sugars, will monitor  ED associated with T2DM (HCC) Continue testosterone, PRN cialis  Control blood pressure, cholesterol, glucose, increase exercise.   Obesity with co morbidities Long discussion about weight loss, diet, and exercise Recommended diet heavy in fruits and veggies and low in animal meats, cheeses, and dairy products, appropriate calorie intake Discussed ideal weight for height  Patient will work on cutting down soda; he feels reasonable to cut this out fully  Phentermine - has been helpful, plans to restart and start working out Weight goal <200 lb Will follow up in 3 months  Vitamin D Def Essentially at goal of 60 at last visit; taking 96283 IU daily  Defer Vit D level  Hypogonadism - continue replacement therapy, check testosterone levels as needed.  Due today - shot administered by [facility] -  200 mg IM Continue 200 mg every 2 weeks; tolerating this dose with erythrocytosis improved Encouraged weight loss, HIIT, zinc 50 mg daily supplement  Hereditary spherocytosis (HCC) s/p splenectomy/ erythrocytosis Followed by Dr. Myna Hidalgo Getting phlebotomies PRN; improved with lower testosterone supplement Check CBC   Continue diet and meds as discussed. Further disposition pending results of labs. Discussed med's effects and SE's.   Over 30 minutes of exam, counseling, chart review, and critical decision making was performed.   Future Appointments  Date Time Provider Department Center  03/20/2020  2:00 PM [facility] NURSE [facility] None  04/08/2020  2:30 PM [name], MD [facility] None  09/02/2020  2:00 PM [name], MD [facility] None    ----------------------------------------------------------------------------------------------------------------------  HPI 49 y.o. male  presents for 3 month follow up on hypertension, cholesterol, T2 diabetes with CKD and ED, obesity, hypogonadism and vitamin D deficiency.   he is prescribed phentermine for weight loss, down 4 lb since last OV. He reports has been off of phentermine, planning to restart. Peak weight 241 lb in 07/2017.   BMI is Body mass index is 32.9 kg/m., he has been working on diet, but very busy at work, planning to restart working out after Thanksgiving  Has cut down on biscuits and bread, eating yogurt, fresh fruit and protein shake, or with do eggs and sausage/bake Reports down from 7-8 sodas a day to 3-4 bottle, trying to cut down  Has reduced alcohol, only 1 time per month Goal is 190- 200 lb per patient  He is very interested in [employer] Readings from Last 3 Encounters:  03/04/20 226  lb (102.5 kg)  02/21/20 230 lb 3.2 oz (104.4 kg)  02/07/20 227 lb (103 kg)   He does not currently check BP at home, doesn't have cuff, today their BP is BP: 122/78  He does not workout. He denies chest pain, shortness  of breath, dizziness.   He is on cholesterol medication (rosvuastatin 40 mg) and denies myalgias. His cholesterol is not at goal. The cholesterol last visit was:   Lab Results  Component Value Date   CHOL 170 11/30/2019   HDL 41 11/30/2019   LDLCALC 101 (H) 11/30/2019   TRIG 180 (H) 11/30/2019   CHOLHDL 4.1 11/30/2019    He has not been working on diet and exercise for T2 diabetes (since 07/2017, 8.4% as of 07/2018) now on metformin 500 mg but states only taking 2 tabs, not 4 tabs, also newly on ozemic since last visit, up to 0.5 mg weekly without suspected SE, with CKD II and ED and denies foot ulcerations, increased appetite, nausea, paresthesia of the feet, polydipsia, polyuria, visual disturbances, vomiting and weight loss.  He refuses glucometer.  Last A1C in the office was:  Lab Results  Component Value Date   HGBA1C 7.5 (H) 11/30/2019   He has stable CKD II associated with T2DM monitored via this office:  Lab Results  Component Value Date   GFRNONAA 70 11/30/2019   Patient is on Vitamin D supplement, taking 16109 IU daily:   Lab Results  Component Value Date   VD25OH 73 08/30/2019     He has a history of testosterone deficiency and is on testosterone replacement. His dose was reduced from 400 mg every 2 weeks to 200 mg every 2 weeks due to erythrocytosis which has improved. Injection is due today. He states that the testosterone helps with his energy, libido, muscle mass. He is also prescribed cialis PRN due to ED.  Lab Results  Component Value Date   TESTOSTERONE 753 08/30/2019   He has hx of splenectomy remotely due to hereditary spherocytosis; also followed for erythrocytosis, was referred to hem Dr. Myna Hidalgo per his request and had phlebotomy but monitoring only at this time due to improved since reducing testosterone dose:  Lab Results  Component Value Date   WBC 13.4 (H) 11/30/2019   HGB 18.1 (H) 11/30/2019   HCT 51.2 (H) 11/30/2019   MCV 94.1 11/30/2019   PLT 548  (H) 11/30/2019     Current Medications:  Current Outpatient Medications on File Prior to Visit  Medication Sig  . ASPIRIN 81 PO Take by mouth daily.  . Cholecalciferol (VITAMIN D-3) 5000 units TABS Take 5,000 Units by mouth 2 (two) times daily.  Marland Kitchen doxycycline (VIBRAMYCIN) 100 MG capsule Take 1 capsule     2 x /day     with food for Rosacea  . finasteride (PROSCAR) 5 MG tablet Take 1 tablet Daily for Prostate  . lisinopril (ZESTRIL) 20 MG tablet Take 1 tablet Daily for BP  . metFORMIN (GLUCOPHAGE-XR) 500 MG 24 hr tablet Take     2 tablets      2 x /day      with Meals for Diabetes  . rosuvastatin (CRESTOR) 40 MG tablet TAKE 1 TABLET BY MOUTH EVERY DAY  . Semaglutide,0.25 or 0.5MG /DOS, (OZEMPIC, 0.25 OR 0.5 MG/DOSE,) 2 MG/1.5ML SOPN Inject 0.25 mg into skin once weekly for 4 weeks, then 0.5 mg weekly for 4 weeks.  . tadalafil (CIALIS) 20 MG tablet TAKE 1/2 TO 1 TABLET BY MOUTH EVERY 2 TO  3 DAYS AS NEEDED  . testosterone cypionate (DEPOTESTOSTERONE CYPIONATE) 200 MG/ML injection INJECT INTO THE MUSCLE EVERY WEEK  . Zinc 50 MG CAPS Take by mouth daily.  . phentermine (ADIPEX-P) 37.5 MG tablet TAKE 1/2 TO 1 TABLET BY MOUTH ONCE DAILY AS NEEDED FOR WEIGHT LOSS. TAKE REGULAR DRUG BREAKS. (Patient not taking: Reported on 03/04/2020)   No current facility-administered medications on file prior to visit.     Allergies: No Known Allergies   Medical History:  Past Medical History:  Diagnosis Date  . 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 Hyperlipidemia   . Hypertension   . Hypogonadism male   . Insulin resistance   . Pre-diabetes    Family history- Reviewed and unchanged Social history- Reviewed and unchanged   Review of Systems:  Review of Systems  Constitutional: Negative for malaise/fatigue and weight loss.  HENT: Negative for hearing loss and tinnitus.   Eyes: Negative for blurred vision and double vision.  Respiratory: Negative for cough, shortness of breath  and wheezing.   Cardiovascular: Negative for chest pain, palpitations, orthopnea, claudication and leg swelling.  Gastrointestinal: Negative for abdominal pain, blood in stool, constipation, diarrhea, heartburn, melena, nausea and vomiting.  Genitourinary: Negative.   Musculoskeletal: Negative for joint pain and myalgias.  Skin: Negative for rash.  Neurological: Negative for dizziness, tingling, sensory change, weakness and headaches.  Endo/Heme/Allergies: Negative for polydipsia.  Psychiatric/Behavioral: Negative.   All other systems reviewed and are negative.   Physical Exam: BP 122/78   Pulse 89   Temp (!) 97.3 F (36.3 C)   Wt 226 lb (102.5 kg)   SpO2 98%   BMI 32.90 kg/m  Wt Readings from Last 3 Encounters:  03/04/20 226 lb (102.5 kg)  02/21/20 230 lb 3.2 oz (104.4 kg)  02/07/20 227 lb (103 kg)   General Appearance: Well nourished, in no apparent distress. Eyes: PERRLA, EOMs, conjunctiva no swelling or erythema Sinuses: No Frontal/maxillary tenderness ENT/Mouth: Ext aud canals clear, TMs without erythema, bulging. No erythema, swelling, or exudate on post pharynx.  Tonsils not swollen or erythematous. Hearing normal.  Neck: Supple, thyroid normal.  Respiratory: Respiratory effort normal, BS equal bilaterally without rales, rhonchi, wheezing or stridor.  Cardio: RRR with no MRGs. Brisk peripheral pulses without edema.  Abdomen: Soft, + BS.  Non tender, no guarding, rebound, hernias, masses. Lymphatics: Non tender without lymphadenopathy.  Musculoskeletal: Full ROM, 5/5 strength, Normal gait Skin: Warm, dry without rashes, lesions, ecchymosis.  Neuro: Cranial nerves intact. No cerebellar symptoms.  Psych: Awake and oriented X 3, normal affect, Insight and Judgment appropriate.    Dan Maker, NP 3:46 PM [facility]

## 2020-03-04 ENCOUNTER — Encounter: Payer: Self-pay | Admitting: Adult Health

## 2020-03-04 ENCOUNTER — Other Ambulatory Visit: Payer: Self-pay

## 2020-03-04 ENCOUNTER — Ambulatory Visit: Payer: BC Managed Care – PPO | Admitting: Adult Health

## 2020-03-04 VITALS — BP 122/78 | HR 89 | Temp 97.3°F | Wt 226.0 lb

## 2020-03-04 DIAGNOSIS — N521 Erectile dysfunction due to diseases classified elsewhere: Secondary | ICD-10-CM

## 2020-03-04 DIAGNOSIS — Z79899 Other long term (current) drug therapy: Secondary | ICD-10-CM

## 2020-03-04 DIAGNOSIS — E349 Endocrine disorder, unspecified: Secondary | ICD-10-CM | POA: Diagnosis not present

## 2020-03-04 DIAGNOSIS — E1122 Type 2 diabetes mellitus with diabetic chronic kidney disease: Secondary | ICD-10-CM | POA: Diagnosis not present

## 2020-03-04 DIAGNOSIS — D580 Hereditary spherocytosis: Secondary | ICD-10-CM | POA: Diagnosis not present

## 2020-03-04 DIAGNOSIS — E785 Hyperlipidemia, unspecified: Secondary | ICD-10-CM | POA: Diagnosis not present

## 2020-03-04 DIAGNOSIS — E1169 Type 2 diabetes mellitus with other specified complication: Secondary | ICD-10-CM | POA: Diagnosis not present

## 2020-03-04 DIAGNOSIS — E669 Obesity, unspecified: Secondary | ICD-10-CM

## 2020-03-04 DIAGNOSIS — E559 Vitamin D deficiency, unspecified: Secondary | ICD-10-CM

## 2020-03-04 DIAGNOSIS — I10 Essential (primary) hypertension: Secondary | ICD-10-CM

## 2020-03-04 DIAGNOSIS — D751 Secondary polycythemia: Secondary | ICD-10-CM

## 2020-03-04 DIAGNOSIS — N182 Chronic kidney disease, stage 2 (mild): Secondary | ICD-10-CM

## 2020-03-04 MED ORDER — OZEMPIC (1 MG/DOSE) 2 MG/1.5ML ~~LOC~~ SOPN: 1.0000 mg | PEN_INJECTOR | SUBCUTANEOUS | 0 refills | Status: DC

## 2020-03-04 MED ORDER — TESTOSTERONE CYPIONATE 200 MG/ML IM SOLN
100.0000 mg | Freq: Once | INTRAMUSCULAR | Status: AC
  Administered 2020-03-04: 100 mg via INTRAMUSCULAR

## 2020-03-04 NOTE — Patient Instructions (Addendum)
Goals    . DIET - DECREASE SODA OR JUICE INTAKE    . Exercise 150 min/wk Moderate Activity    . HEMOGLOBIN A1C < 5.7    . Weight (lb) < 200 lb (90.7 kg)        PLEASE QUIT SODA  RECOMMEND COVID 19 vaccine - pfizer or moderna - ASAP - great vaccine, no point in waiting, sooner can get the better  Risks of death and long term health consequences are [facility] higher with getting covid 19 while unvaccinated than the rare side effects from getting the vaccine   Please increase back to up 2000 mg of metformin daily - can increase to 3 tabs x 2 weeks, then 4 tabs daily   Increase from 0,5 mg ozempic to 1 mg ozempic weekly   OVERDUE DIABETIC EYE EXAM - please try to get this done before the end of the year  Eye doctors that you can call, they are all very close to our office - but can get at any location that does diabetes eye exam. Just have them forward [place] the report. This is due annually -   Dr. Hazle Quant [phone] Dr. Elmer Picker [phone]   Recommend cutting down on sugar intake  - the American Heart Association recommends no more than 9 teaspoons (38 g) of added sugar for men daily, and 6 teaspoons (25 g) for women. Added sugar can be in many things that you might not expect - salad dressings, bread that is not home made, "all natural" fruit juice, etc., most processed foods contain hidden sugars. Consider looking at labels and being aware of how much sugar you are consuming in a day. Less is always better for sugar; sugar reduces your body's immune response, and damages blood vessels, leading to increased risk of many diseases.      Diabetes is a very complicated disease...lets simplify it.  An easy way to look at it to understand the complications is if you think of the extra sugar floating in your blood stream as glass shards floating through your blood stream.    Diabetes affects your small vessels first: 1) The glass shards (sugar) scraps down the tiny blood vessels in your eyes  and lead to diabetic retinopathy, the leading cause of blindness in the [place]. Diabetes is the leading cause of newly diagnosed adult (66 to 49 years of age) blindness in the [place].  2) The glass shards scratches down the tiny vessels of your legs leading to nerve damage called neuropathy and can lead to amputations of your feet. More than 60% of all non-traumatic amputations of lower limbs occur in people with diabetes.  3) Over time the small vessels in your brain are shredded and closed off, individually this does not cause any problems but over a long period of time many of the small vessels being blocked can lead to Vascular Dementia.   4) Your kidney's are a filter system and have a "net" that keeps certain things in the body and lets bad things out. Sugar shreds this net and leads to kidney damage and eventually failure. Decreasing the sugar that is destroying the net and certain blood pressure medications can help stop or decrease progression of kidney disease. Diabetes was the primary cause of kidney failure in 44 percent of all new cases in 2011.  5) Diabetes also destroys the small vessels in your penis that lead to erectile dysfunction. Eventually the vessels are so damaged that you  may not be responsive to cialis or viagra.   Diabetes and your large vessels: Your larger vessels consist of your coronary arteries in your heart and the carotid vessels to your brain. Diabetes or even increased sugars put you at 300% increased risk of heart attack and stroke and this is why.. The sugar scrapes down your large blood vessels and your body sees this as an internal injury and tries to repair itself. Just like you get a scab on your skin, your platelets will stick to the blood vessel wall trying to heal it. This is why we have diabetics on low dose aspirin daily, this prevents the platelets from sticking and can prevent plaque formation. In addition, your body takes cholesterol and tries to shove  it into the open wound. This is why we want your LDL, or bad cholesterol, below 70.   The combination of platelets and cholesterol over 5-10 years forms plaque that can break off and cause a heart attack or stroke.   PLEASE REMEMBER:  Diabetes is preventable! Up to 85 percent of complications and morbidities among individuals with type 2 diabetes can be prevented, delayed, or effectively treated and minimized with regular visits to a health professional, appropriate monitoring and medication, and a healthy diet and lifestyle.

## 2020-03-04 NOTE — Progress Notes (Signed)
Patient is here for a NV to receive his Testosterone Cypionate 200 mg/ml 1 ml IM injection in hi left upper outer quadrant. Patient tolerated well and next injection due in 2 weeks.

## 2020-03-05 LAB — CBC WITH DIFFERENTIAL/PLATELET
Absolute Monocytes: 1283 cells/uL — ABNORMAL HIGH (ref 200–950)
Basophils Absolute: 149 cells/uL (ref 0–200)
Basophils Relative: 1.1 %
Eosinophils Absolute: 392 cells/uL (ref 15–500)
Eosinophils Relative: 2.9 %
HCT: 51.7 % — ABNORMAL HIGH (ref 38.5–50.0)
Hemoglobin: 18.7 g/dL — ABNORMAL HIGH (ref 13.2–17.1)
Lymphs Abs: 3632 cells/uL (ref 850–3900)
MCH: 33.2 pg — ABNORMAL HIGH (ref 27.0–33.0)
MCHC: 36.2 g/dL — ABNORMAL HIGH (ref 32.0–36.0)
MCV: 91.7 fL (ref 80.0–100.0)
MPV: 9 fL (ref 7.5–12.5)
Monocytes Relative: 9.5 %
Neutro Abs: 8046 cells/uL — ABNORMAL HIGH (ref 1500–7800)
Neutrophils Relative %: 59.6 %
Platelets: 572 10*3/uL — ABNORMAL HIGH (ref 140–400)
RBC: 5.64 10*6/uL (ref 4.20–5.80)
RDW: 12.6 % (ref 11.0–15.0)
Total Lymphocyte: 26.9 %
WBC: 13.5 10*3/uL — ABNORMAL HIGH (ref 3.8–10.8)

## 2020-03-05 LAB — COMPLETE METABOLIC PANEL WITH GFR
AG Ratio: 1.8 (calc) (ref 1.0–2.5)
ALT: 34 U/L (ref 9–46)
AST: 20 U/L (ref 10–40)
Albumin: 4.8 g/dL (ref 3.6–5.1)
Alkaline phosphatase (APISO): 62 U/L (ref 36–130)
BUN: 15 mg/dL (ref 7–25)
CO2: 30 mmol/L (ref 20–32)
Calcium: 10.7 mg/dL — ABNORMAL HIGH (ref 8.6–10.3)
Chloride: 101 mmol/L (ref 98–110)
Creat: 1.12 mg/dL (ref 0.60–1.35)
GFR, Est African American: 89 mL/min/{1.73_m2} (ref >=60)
GFR, Est Non African American: 77 mL/min/{1.73_m2} (ref >=60)
Globulin: 2.7 g/dL (calc) (ref 1.9–3.7)
Glucose, Bld: 129 mg/dL — ABNORMAL HIGH (ref 65–99)
Potassium: 4.7 mmol/L (ref 3.5–5.3)
Sodium: 138 mmol/L (ref 135–146)
Total Bilirubin: 1 mg/dL (ref 0.2–1.2)
Total Protein: 7.5 g/dL (ref 6.1–8.1)

## 2020-03-05 LAB — TSH: TSH: 2.14 mIU/L (ref 0.40–4.50)

## 2020-03-05 LAB — HEMOGLOBIN A1C
Hgb A1c MFr Bld: 7.6 % of total Hgb — ABNORMAL HIGH (ref <5.7)
Mean Plasma Glucose: 171 (calc)
eAG (mmol/L): 9.5 (calc)

## 2020-03-05 LAB — LIPID PANEL
Cholesterol: 128 mg/dL (ref <200)
HDL: 38 mg/dL — ABNORMAL LOW (ref >=40)
LDL Cholesterol (Calc): 66 mg/dL (calc)
Non-HDL Cholesterol (Calc): 90 mg/dL (calc) (ref <130)
Total CHOL/HDL Ratio: 3.4 (calc) (ref <5.0)
Triglycerides: 166 mg/dL — ABNORMAL HIGH (ref <150)

## 2020-03-05 LAB — MAGNESIUM: Magnesium: 1.9 mg/dL (ref 1.5–2.5)

## 2020-03-12 ENCOUNTER — Other Ambulatory Visit: Payer: Self-pay | Admitting: Internal Medicine

## 2020-03-12 DIAGNOSIS — L719 Rosacea, unspecified: Secondary | ICD-10-CM

## 2020-03-14 ENCOUNTER — Other Ambulatory Visit: Payer: Self-pay

## 2020-03-14 ENCOUNTER — Other Ambulatory Visit: Payer: Self-pay | Admitting: Internal Medicine

## 2020-03-14 MED ORDER — ROSUVASTATIN CALCIUM 40 MG PO TABS
40.0000 mg | ORAL_TABLET | Freq: Every day | ORAL | 1 refills | Status: DC
Start: 2020-03-14 — End: 2022-01-20

## 2020-03-20 ENCOUNTER — Other Ambulatory Visit: Payer: Self-pay

## 2020-03-20 ENCOUNTER — Ambulatory Visit: Payer: BC Managed Care – PPO

## 2020-03-20 ENCOUNTER — Ambulatory Visit: Payer: BC Managed Care – PPO | Admitting: *Deleted

## 2020-03-20 VITALS — BP 114/76 | HR 94 | Temp 97.0°F | Resp 16 | Ht 69.5 in | Wt 226.9 lb

## 2020-03-20 DIAGNOSIS — E349 Endocrine disorder, unspecified: Secondary | ICD-10-CM | POA: Diagnosis not present

## 2020-03-20 MED ORDER — TESTOSTERONE CYPIONATE 200 MG/ML IM SOLN
400.0000 mg | Freq: Once | INTRAMUSCULAR | Status: AC
  Administered 2020-03-20: 400 mg via INTRAMUSCULAR

## 2020-03-20 NOTE — Progress Notes (Signed)
Patient is here for a NV for his Testosterone Cypionate 200 mg/ml 1 ml IM injection in left upper outer quadrant. Patient tolerated well.

## 2020-04-07 NOTE — Progress Notes (Signed)
History of Present Illness:       This very nice 49 y.o. single WM presents for 6 month follow up with HTN, HLD, Pre-Diabetes and Vitamin D Deficiency.       Patient is treated for HTN & BP has been controlled at home. Today's BP is at goal - 102/68. Patient has had no complaints of any cardiac type chest pain, palpitations, dyspnea / orthopnea / PND, dizziness, claudication, or dependent edema.      Hyperlipidemia is controlled with diet & meds. Patient denies myalgias or other med SE's. Last Lipids were at goal excerptslightly elevated Trig's:  Lab Results  Component Value Date   CHOL 128 03/04/2020   HDL 38 (L) 03/04/2020   LDLCALC 66 03/04/2020   TRIG 166 (H) 03/04/2020   CHOLHDL 3.4 03/04/2020    Also, the patient has history of T2_NIDDM (2011) /CKD2  (GFR 77)  and has had no symptoms of reactive hypoglycemia, diabetic polys, paresthesias or visual blurring.  Last A1c was not at goal:  Lab Results  Component Value Date   HGBA1C 7.6 (H) 03/04/2020       Further, the patient also has history of Vitamin D Deficiency and supplements vitamin D without any suspected side-effects. Last vitamin D was  Lab Results  Component Value Date   VD25OH 58 08/30/2019    Current Outpatient Medications on File Prior to Visit  Medication Sig  . ASPIRIN 81 PO Take by mouth daily.  . Cholecalciferol (VITAMIN D-3) 5000 units TABS Take 5,000 Units by mouth 2 (two) times daily.  Marland Kitchen doxycycline (VIBRAMYCIN) 100 MG capsule Take      1 capsule       2 x /day        with Food         for Skin Infection  . finasteride (PROSCAR) 5 MG tablet Take 1 tablet Daily for Prostate  . lisinopril (ZESTRIL) 20 MG tablet Take 1 tablet Daily for BP  . metFORMIN -XR) 500 MG  Take     2 tablets      2 x /day   . phentermine 37.5 MG tablet Patient not taking: Reported on 03/04/2020  . rosuvastatin40 MG tablet Take 1 tablet  daily.  . Semaglutide, 1 MG/DOSE, (OZEMPIC, 1 MG/DOSE,) 2 MG/1.5ML SOPN Inject 1 mg  into the skin once a week.  . testosterone cypio200 MG/ML injection INJECT INTO THE MUSCLE EVERY WEEK  . Zinc 50 MG CAPS Take by mouth daily.    No Known Allergies  PMHx:   Past Medical History:  Diagnosis Date  . 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 Hyperlipidemia   . Hypertension   . Hypogonadism male   . Insulin resistance   . Pre-diabetes     Immunization History  Administered Date(s) Administered  . PPD Test 05/08/2013, 05/09/2014, 05/27/2015, 06/15/2016, 07/29/2017, 08/18/2018, 08/30/2019  . Pneumococcal Conjugate-13 07/29/2017  . Pneumococcal Polysaccharide-23 01/08/2010, 05/09/2014  . Td 10/20/2004  . Tdap 05/09/2014  . Zoster 06/05/2015    Past Surgical History:  Procedure Laterality Date  . SPLENECTOMY      FHx:    Reviewed / unchanged  SHx:    Reviewed / unchanged   Systems Review:  Constitutional: Denies fever, chills, wt changes, headaches, insomnia, fatigue, night sweats, change in appetite. Eyes: Denies redness, blurred vision, diplopia, discharge, itchy, watery eyes.  ENT: Denies discharge, congestion, post nasal drip, epistaxis, sore throat, earache, hearing loss, dental pain,  tinnitus, vertigo, sinus pain, snoring.  CV: Denies chest pain, palpitations, irregular heartbeat, syncope, dyspnea, diaphoresis, orthopnea, PND, claudication or edema. Respiratory: denies cough, dyspnea, DOE, pleurisy, hoarseness, laryngitis, wheezing.  Gastrointestinal: Denies dysphagia, odynophagia, heartburn, reflux, water brash, abdominal pain or cramps, nausea, vomiting, bloating, diarrhea, constipation, hematemesis, melena, hematochezia  or hemorrhoids. Genitourinary: Denies dysuria, frequency, urgency, nocturia, hesitancy, discharge, hematuria or flank pain. Musculoskeletal: Denies arthralgias, myalgias, stiffness, jt. swelling, pain, limping or strain/sprain.  Skin: Denies pruritus, rash, hives, warts, acne, eczema or change in skin  lesion(s). Neuro: No weakness, tremor, incoordination, spasms, paresthesia or pain. Psychiatric: Denies confusion, memory loss or sensory loss. Endo: Denies change in weight, skin or hair change.  Heme/Lymph: No excessive bleeding, bruising or enlarged lymph nodes.  Physical Exam  BP 102/68   Pulse 89   Temp (!) 97.5 F (36.4 C)   Resp 16   Ht 5\' 9"  (1.753 m)   Wt 218 lb 12.8 oz (99.2 kg)   SpO2 98%   BMI 32.31 kg/m   Appears  well nourished, well groomed  and in no distress.  Eyes: PERRLA, EOMs, conjunctiva no swelling or erythema. Sinuses: No frontal/maxillary tenderness ENT/Mouth: EAC's clear, TM's nl w/o erythema, bulging. Nares clear w/o erythema, swelling, exudates. Oropharynx clear without erythema or exudates. Oral hygiene is good. Tongue normal, non obstructing. Hearing intact.  Neck: Supple. Thyroid not palpable. Car 2+/2+ without bruits, nodes or JVD. Chest: Respirations nl with BS clear & equal w/o rales, rhonchi, wheezing or stridor.  Cor: Heart sounds normal w/ regular rate and rhythm without sig. murmurs, gallops, clicks or rubs. Peripheral pulses normal and equal  without edema.  Abdomen: Soft & bowel sounds normal. Non-tender w/o guarding, rebound, hernias, masses or organomegaly.  Lymphatics: Unremarkable.  Musculoskeletal: Full ROM all peripheral extremities, joint stability, 5/5 strength and normal gait.  Skin: Warm, dry without exposed rashes, lesions or ecchymosis apparent.  Neuro: Cranial nerves intact, reflexes equal bilaterally. Sensory-motor testing grossly intact. Tendon reflexes grossly intact.  Pysch: Alert & oriented x 3.  Insight and judgement nl & appropriate. No ideations.  Assessment and Plan:  1. Essential hypertension  - Continue medication, monitor blood pressure at home.  - Continue DASH diet.  Reminder to go to the ER if any CP,  SOB, nausea, dizziness, severe HA, changes vision/speech.   2. Hyperlipidemia associated with type 2  diabetes mellitus (HCC)  - Continue diet/meds, exercise,& lifestyle modifications.  - Continue monitor periodic cholesterol/liver & renal functions    3. Type 2 diabetes mellitus with stage 2 chronic kidney disease, without long-term current use of insulin (HCC)  - Continue diet, exercise  - Lifestyle modifications.  - Monitor appropriate labs.  - Fructosamine  4. Vitamin D deficiency  - Continue supplementation.   5. Testosterone deficiency   6. Medication management          Discussed  regular exercise, BP monitoring, weight control to achieve/maintain BMI less than 25 and discussed med and SE's. Recommended labs to assess and monitor clinical status with further disposition pending results of labs.  I discussed the assessment and treatment plan with the patient. The patient was provided an opportunity to ask questions and all were answered. The patient agreed with the plan and demonstrated an understanding of the instructions.  I provided over 25 minutes of exam, counseling, chart review and  complex critical decision making.   , MD

## 2020-04-08 ENCOUNTER — Encounter: Payer: Self-pay | Admitting: Internal Medicine

## 2020-04-08 ENCOUNTER — Other Ambulatory Visit: Payer: Self-pay

## 2020-04-08 ENCOUNTER — Ambulatory Visit: Payer: BC Managed Care – PPO | Admitting: Internal Medicine

## 2020-04-08 VITALS — BP 102/68 | HR 89 | Temp 97.5°F | Resp 16 | Ht 69.0 in | Wt 218.8 lb

## 2020-04-08 DIAGNOSIS — E1169 Type 2 diabetes mellitus with other specified complication: Secondary | ICD-10-CM

## 2020-04-08 DIAGNOSIS — E559 Vitamin D deficiency, unspecified: Secondary | ICD-10-CM | POA: Diagnosis not present

## 2020-04-08 DIAGNOSIS — E1122 Type 2 diabetes mellitus with diabetic chronic kidney disease: Secondary | ICD-10-CM | POA: Diagnosis not present

## 2020-04-08 DIAGNOSIS — E785 Hyperlipidemia, unspecified: Secondary | ICD-10-CM

## 2020-04-08 DIAGNOSIS — N182 Chronic kidney disease, stage 2 (mild): Secondary | ICD-10-CM

## 2020-04-08 DIAGNOSIS — Z79899 Other long term (current) drug therapy: Secondary | ICD-10-CM

## 2020-04-08 DIAGNOSIS — I10 Essential (primary) hypertension: Secondary | ICD-10-CM | POA: Diagnosis not present

## 2020-04-08 DIAGNOSIS — E349 Endocrine disorder, unspecified: Secondary | ICD-10-CM | POA: Diagnosis not present

## 2020-04-08 MED ORDER — TESTOSTERONE CYPIONATE 200 MG/ML IM SOLN
400.0000 mg | Freq: Once | INTRAMUSCULAR | Status: AC
  Administered 2020-04-08: 400 mg via INTRAMUSCULAR

## 2020-04-08 MED ORDER — TADALAFIL 20 MG PO TABS: ORAL_TABLET | ORAL | 0 refills | Status: DC

## 2020-04-08 NOTE — Patient Instructions (Signed)

## 2020-04-08 NOTE — Progress Notes (Signed)
Patient received Testosterone Cypionate 200 mg/ml 1 ml in right upper outer quadrant.

## 2020-04-09 LAB — CBC WITH DIFFERENTIAL/PLATELET
Absolute Monocytes: 1428 cells/uL — ABNORMAL HIGH (ref 200–950)
Basophils Absolute: 150 cells/uL (ref 0–200)
Basophils Relative: 1.1 %
Eosinophils Absolute: 231 cells/uL (ref 15–500)
Eosinophils Relative: 1.7 %
HCT: 51.8 % — ABNORMAL HIGH (ref 38.5–50.0)
Hemoglobin: 18.4 g/dL — ABNORMAL HIGH (ref 13.2–17.1)
Lymphs Abs: 3155 cells/uL (ref 850–3900)
MCH: 32 pg (ref 27.0–33.0)
MCHC: 35.5 g/dL (ref 32.0–36.0)
MCV: 90.1 fL (ref 80.0–100.0)
MPV: 8.9 fL (ref 7.5–12.5)
Monocytes Relative: 10.5 %
Neutro Abs: 8636 cells/uL — ABNORMAL HIGH (ref 1500–7800)
Neutrophils Relative %: 63.5 %
Platelets: 567 10*3/uL — ABNORMAL HIGH (ref 140–400)
RBC: 5.75 10*6/uL (ref 4.20–5.80)
RDW: 12.7 % (ref 11.0–15.0)
Total Lymphocyte: 23.2 %
WBC: 13.6 10*3/uL — ABNORMAL HIGH (ref 3.8–10.8)

## 2020-04-09 LAB — COMPLETE METABOLIC PANEL WITH GFR
AG Ratio: 1.5 (calc) (ref 1.0–2.5)
ALT: 22 U/L (ref 9–46)
AST: 18 U/L (ref 10–40)
Albumin: 4.5 g/dL (ref 3.6–5.1)
Alkaline phosphatase (APISO): 52 U/L (ref 36–130)
BUN: 15 mg/dL (ref 7–25)
CO2: 26 mmol/L (ref 20–32)
Calcium: 10.4 mg/dL — ABNORMAL HIGH (ref 8.6–10.3)
Chloride: 100 mmol/L (ref 98–110)
Creat: 1.21 mg/dL (ref 0.60–1.35)
GFR, Est African American: 81 mL/min/{1.73_m2} (ref >=60)
GFR, Est Non African American: 70 mL/min/{1.73_m2} (ref >=60)
Globulin: 3 g/dL (calc) (ref 1.9–3.7)
Glucose, Bld: 110 mg/dL — ABNORMAL HIGH (ref 65–99)
Potassium: 4.6 mmol/L (ref 3.5–5.3)
Sodium: 137 mmol/L (ref 135–146)
Total Bilirubin: 2 mg/dL — ABNORMAL HIGH (ref 0.2–1.2)
Total Protein: 7.5 g/dL (ref 6.1–8.1)

## 2020-04-09 LAB — TESTOSTERONE: Testosterone: 109 ng/dL — ABNORMAL LOW (ref 250–827)

## 2020-04-09 LAB — HEMOGLOBIN A1C
Hgb A1c MFr Bld: 6.6 % of total Hgb — ABNORMAL HIGH (ref <5.7)
Mean Plasma Glucose: 143 mg/dL
eAG (mmol/L): 7.9 mmol/L

## 2020-04-09 LAB — LIPID PANEL
Cholesterol: 87 mg/dL (ref <200)
HDL: 34 mg/dL — ABNORMAL LOW (ref >=40)
LDL Cholesterol (Calc): 35 mg/dL (calc)
Non-HDL Cholesterol (Calc): 53 mg/dL (calc) (ref <130)
Total CHOL/HDL Ratio: 2.6 (calc) (ref <5.0)
Triglycerides: 92 mg/dL (ref <150)

## 2020-04-09 LAB — VITAMIN D 25 HYDROXY (VIT D DEFICIENCY, FRACTURES): Vit D, 25-Hydroxy: 105 ng/mL — ABNORMAL HIGH (ref 30–100)

## 2020-04-09 LAB — INSULIN, RANDOM: Insulin: 8.6 u[IU]/mL

## 2020-04-09 LAB — MAGNESIUM: Magnesium: 1.7 mg/dL (ref 1.5–2.5)

## 2020-04-09 LAB — TSH: TSH: 1.97 mIU/L (ref 0.40–4.50)

## 2020-04-09 NOTE — Progress Notes (Signed)
========================================================== ==========================================================  -    Total Chol = 87 and LDL = 35   Excellent   - Very low risk for Heart Attack  / Stroke ==========================================================  - So recommend decrease your Rosuvastatin  / Crestor 40 mg to  1/4 tab (= 10 mg) Daily or 1/2 tab every other day  ==========================================================  -  A1c - Much Better - Down from 7.6% to now 6.6% -Great !  ==========================================================  -  Vit D = 105  - Excellent   ==========================================================  -  Testosterone level is low  (between shots) ==========================================================  -  All Else - CBC - Kidneys - Electrolytes - Liver - Magnesium & Thyroid    - all  Normal / OK ==========================================================   - Keep up the [place] Work  !

## 2020-04-12 LAB — FRUCTOSAMINE: Fructosamine: 314 umol/L — ABNORMAL HIGH (ref 205–285)

## 2020-04-13 NOTE — Progress Notes (Signed)
========================================================== ==========================================================  -    Fructosamine returned 314 mg% which is a 6 week average   2 hour post prandial blood sugar   and equates to an A1c of 12.5% - which is way too high   (Ideal or goal A1c is less than 5.7% ! )   - So . . . . . . . . . . . Marland Kitchen   -   Need a stricter diet   -   Avoid Sweets, Candy & White Stuff   -   Rice, Potatoes, Breads &  Pasta

## 2020-04-15 ENCOUNTER — Other Ambulatory Visit: Payer: Self-pay | Admitting: Internal Medicine

## 2020-04-15 DIAGNOSIS — E119 Type 2 diabetes mellitus without complications: Secondary | ICD-10-CM

## 2020-04-15 MED ORDER — METFORMIN HCL ER 500 MG PO TB24: ORAL_TABLET | ORAL | 0 refills | Status: DC

## 2020-04-15 MED ORDER — FINASTERIDE 5 MG PO TABS: ORAL_TABLET | ORAL | 0 refills | Status: DC

## 2020-04-15 MED ORDER — LISINOPRIL 20 MG PO TABS: ORAL_TABLET | ORAL | 0 refills | Status: DC

## 2020-04-24 ENCOUNTER — Other Ambulatory Visit: Payer: Self-pay

## 2020-04-24 ENCOUNTER — Ambulatory Visit: Payer: BC Managed Care – PPO | Admitting: *Deleted

## 2020-04-24 VITALS — BP 110/80 | HR 93 | Temp 96.3°F | Resp 16 | Ht 69.0 in | Wt 221.6 lb

## 2020-04-24 DIAGNOSIS — E349 Endocrine disorder, unspecified: Secondary | ICD-10-CM

## 2020-04-24 MED ORDER — TESTOSTERONE CYPIONATE 200 MG/ML IM SOLN
400.0000 mg | Freq: Once | INTRAMUSCULAR | Status: AC
  Administered 2020-04-24: 200 mg via INTRAMUSCULAR

## 2020-04-24 NOTE — Progress Notes (Signed)
Patient here for for Testosterone Cypionate 200 mg/ml 1 ml IM injection in left upper outer quadrant. Patient tolerated well and will return in 2 weeks.

## 2020-05-08 ENCOUNTER — Other Ambulatory Visit: Payer: Self-pay

## 2020-05-08 ENCOUNTER — Ambulatory Visit: Payer: BC Managed Care – PPO | Admitting: *Deleted

## 2020-05-08 ENCOUNTER — Other Ambulatory Visit: Payer: Self-pay | Admitting: Internal Medicine

## 2020-05-08 VITALS — BP 110/76 | HR 106 | Temp 97.0°F | Resp 16 | Ht 69.0 in | Wt 216.6 lb

## 2020-05-08 DIAGNOSIS — E349 Endocrine disorder, unspecified: Secondary | ICD-10-CM | POA: Diagnosis not present

## 2020-05-08 MED ORDER — TESTOSTERONE CYPIONATE 200 MG/ML IM SOLN
400.0000 mg | Freq: Once | INTRAMUSCULAR | Status: AC
Start: 2020-05-08 — End: 2020-05-08
  Administered 2020-05-08: 400 mg via INTRAMUSCULAR

## 2020-05-08 NOTE — Progress Notes (Signed)
Patient here for his Testosterone Cypionate 200 mg/ml injection 1 ml IM in right upper outer quadrant. Patient tolerated well and will return in 2 weeks.

## 2020-05-22 ENCOUNTER — Ambulatory Visit: Payer: BC Managed Care – PPO

## 2020-05-22 ENCOUNTER — Other Ambulatory Visit: Payer: Self-pay

## 2020-05-22 VITALS — BP 120/80 | HR 101 | Temp 97.7°F | Wt 217.0 lb

## 2020-05-22 DIAGNOSIS — E291 Testicular hypofunction: Secondary | ICD-10-CM

## 2020-05-22 DIAGNOSIS — Z79899 Other long term (current) drug therapy: Secondary | ICD-10-CM

## 2020-05-22 MED ORDER — TESTOSTERONE CYPIONATE 200 MG/ML IM SOLN
200.0000 mg | INTRAMUSCULAR | Status: DC
  Administered 2020-05-22 – 2020-07-17 (×2): 200 mg via INTRAMUSCULAR

## 2020-05-22 NOTE — Progress Notes (Signed)
Patient here for his Testosterone Cypionate 200 mg/ml injection 1 ml IM in LEFT upper outer quadrant. Patient tolerated well and will return in 2 weeks.

## 2020-05-27 ENCOUNTER — Other Ambulatory Visit: Payer: Self-pay | Admitting: Adult Health

## 2020-06-05 ENCOUNTER — Ambulatory Visit: Payer: BC Managed Care – PPO

## 2020-06-05 ENCOUNTER — Other Ambulatory Visit: Payer: Self-pay

## 2020-06-05 VITALS — BP 130/74 | HR 91 | Temp 97.5°F | Wt 217.8 lb

## 2020-06-05 DIAGNOSIS — E291 Testicular hypofunction: Secondary | ICD-10-CM

## 2020-06-05 MED ORDER — TESTOSTERONE CYPIONATE 200 MG/ML IM SOLN
100.0000 mg | Freq: Once | INTRAMUSCULAR | Status: AC
Start: 2020-06-05 — End: 2020-06-05
  Administered 2020-06-05: 100 mg via INTRAMUSCULAR

## 2020-06-05 NOTE — Progress Notes (Signed)
Patient here for his Testosterone Cypionate 200 mg/ml injection 1 ml IM in RIGHT upper outer quadrant. Patient tolerated well and will return in 2 weeks.

## 2020-06-18 ENCOUNTER — Telehealth: Payer: Self-pay | Admitting: *Deleted

## 2020-06-18 NOTE — Telephone Encounter (Signed)
Patient called with a headache since yesterday, but no other symptoms. Per Dr Oneta Rack, OK to take Ibuprofen and can take Tylenol with it, if needed. Patient reports no respiratory symptoms.

## 2020-06-19 ENCOUNTER — Ambulatory Visit: Payer: BC Managed Care – PPO | Admitting: *Deleted

## 2020-06-19 ENCOUNTER — Other Ambulatory Visit: Payer: Self-pay | Admitting: Adult Health

## 2020-06-19 ENCOUNTER — Other Ambulatory Visit: Payer: Self-pay

## 2020-06-19 VITALS — BP 95/75 | HR 129 | Temp 97.0°F | Resp 16 | Ht 69.0 in | Wt 209.8 lb

## 2020-06-19 DIAGNOSIS — Z1152 Encounter for screening for COVID-19: Secondary | ICD-10-CM | POA: Diagnosis not present

## 2020-06-19 DIAGNOSIS — U071 COVID-19: Secondary | ICD-10-CM

## 2020-06-19 DIAGNOSIS — E291 Testicular hypofunction: Secondary | ICD-10-CM

## 2020-06-19 LAB — POC COVID19 BINAXNOW: SARS Coronavirus 2 Ag: POSITIVE — AB

## 2020-06-19 MED ORDER — DEXAMETHASONE 1 MG PO TABS
ORAL_TABLET | ORAL | 0 refills | Status: DC
Start: 2020-06-19 — End: 2020-08-27

## 2020-06-19 MED ORDER — TESTOSTERONE CYPIONATE 200 MG/ML IM SOLN
400.0000 mg | Freq: Once | INTRAMUSCULAR | Status: AC
  Administered 2020-06-19: 200 mg via INTRAMUSCULAR

## 2020-06-19 NOTE — Progress Notes (Signed)
Patient here for his Testosterone Cypionate 200 mg/ml 1 ml injection in his left upper outer quadrant. Injection tolerate well. Patient complained of a headache x 3 day, excessive sleepiness and feels bad. Blood pressure low at 95/75,  elevated pulse and blood sugar 125.Marland Kitchen Covid test performed, with positive result. Per Judd Gaudier, NP, patient instructed to hold his Lisinopril 20 mg if BP is less than 100 systolic and can take 1/2 tablet if systolic is above 100. Patient was advised to quarantine for 5 days, check is BP and purchase a pulse OX to check his oxygen. An RX for Dexamethasone 1 mg tablet to taper sent to his pharmacy.

## 2020-06-28 ENCOUNTER — Other Ambulatory Visit: Payer: Self-pay | Admitting: Internal Medicine

## 2020-06-28 DIAGNOSIS — L719 Rosacea, unspecified: Secondary | ICD-10-CM

## 2020-07-03 ENCOUNTER — Ambulatory Visit: Payer: BC Managed Care – PPO | Admitting: *Deleted

## 2020-07-03 ENCOUNTER — Other Ambulatory Visit: Payer: Self-pay

## 2020-07-03 VITALS — BP 90/68 | HR 93 | Temp 97.7°F | Resp 16 | Wt 205.4 lb

## 2020-07-03 DIAGNOSIS — E291 Testicular hypofunction: Secondary | ICD-10-CM

## 2020-07-03 MED ORDER — TESTOSTERONE CYPIONATE 200 MG/ML IM SOLN
400.0000 mg | Freq: Once | INTRAMUSCULAR | Status: AC
  Administered 2020-07-03: 200 mg via INTRAMUSCULAR

## 2020-07-03 NOTE — Progress Notes (Signed)
Patient here for a NV to receive his Testosterone Cypionate injection 200 mg/ml 1 ml IM. Patient BP is low at 90/68. Per Dr Oneta Rack, patient was given Gatorade to drink and a salty item to eat, in order to raise his BP. Patient states he has not been drinking fluids today. He states he is taking Lisinopril 20 mg daily for his BP. Recheck of his BP was 94/60. Patient denies dizziness and states he feel fine. Per Dr Oneta Rack, hold Lisinopril 20 mg, unless BP is above 140 systolic. Patient will call backwith readings and will bring BP cuff with him to next NV.

## 2020-07-04 ENCOUNTER — Other Ambulatory Visit: Payer: Self-pay | Admitting: Internal Medicine

## 2020-07-17 ENCOUNTER — Other Ambulatory Visit: Payer: Self-pay

## 2020-07-17 ENCOUNTER — Ambulatory Visit: Payer: BC Managed Care – PPO

## 2020-07-17 VITALS — BP 110/66 | HR 97 | Temp 97.5°F | Wt 212.0 lb

## 2020-07-17 DIAGNOSIS — E291 Testicular hypofunction: Secondary | ICD-10-CM

## 2020-07-17 NOTE — Progress Notes (Signed)
Patient here for his Testosterone Cypionate 200 mg/ml injection 1 ml IM inRIGHTupper outer quadrant. Patient tolerated well and will return in 2 weeks.

## 2020-07-28 ENCOUNTER — Other Ambulatory Visit: Payer: Self-pay | Admitting: Internal Medicine

## 2020-07-28 DIAGNOSIS — E119 Type 2 diabetes mellitus without complications: Secondary | ICD-10-CM

## 2020-07-31 ENCOUNTER — Other Ambulatory Visit: Payer: Self-pay

## 2020-07-31 ENCOUNTER — Ambulatory Visit: Payer: BC Managed Care – PPO | Admitting: *Deleted

## 2020-07-31 VITALS — BP 110/52 | HR 93 | Temp 97.7°F | Resp 16 | Wt 215.8 lb

## 2020-07-31 DIAGNOSIS — E291 Testicular hypofunction: Secondary | ICD-10-CM

## 2020-07-31 MED ORDER — TESTOSTERONE CYPIONATE 200 MG/ML IM SOLN
400.0000 mg | Freq: Once | INTRAMUSCULAR | Status: AC
Start: 2020-07-31 — End: 2020-07-31
  Administered 2020-07-31: 200 mg via INTRAMUSCULAR

## 2020-07-31 NOTE — Progress Notes (Signed)
Patient here for his Testosterone Cypionate injection 200 mg/ml 1 ml in left upper outer quadrant. Patient tolerated well and will return in 2 weeks for his next injection.

## 2020-08-06 ENCOUNTER — Other Ambulatory Visit: Payer: Self-pay | Admitting: Internal Medicine

## 2020-08-06 DIAGNOSIS — E349 Endocrine disorder, unspecified: Secondary | ICD-10-CM

## 2020-08-12 ENCOUNTER — Telehealth: Payer: Self-pay | Admitting: *Deleted

## 2020-08-12 NOTE — Telephone Encounter (Signed)
Called patient regarding PA for Testosterone. Patient states he pays cash for the RX and PA not needed.

## 2020-08-14 ENCOUNTER — Other Ambulatory Visit: Payer: Self-pay

## 2020-08-14 ENCOUNTER — Ambulatory Visit: Payer: BC Managed Care – PPO | Admitting: *Deleted

## 2020-08-14 VITALS — BP 118/84 | HR 100 | Temp 97.3°F | Resp 16 | Ht 69.0 in | Wt 217.6 lb

## 2020-08-14 DIAGNOSIS — E291 Testicular hypofunction: Secondary | ICD-10-CM

## 2020-08-14 MED ORDER — TESTOSTERONE CYPIONATE 200 MG/ML IM SOLN
400.0000 mg | Freq: Once | INTRAMUSCULAR | Status: AC
  Administered 2020-08-14: 200 mg via INTRAMUSCULAR

## 2020-08-14 NOTE — Progress Notes (Signed)
Patient here for a NV for his Testosterone Cypionate 200 mg/ml 1 ml IM injection in right upper outer quadrant. Patient tolerated well and will return in 2 weeks for is next injection.

## 2020-08-16 ENCOUNTER — Telehealth: Payer: Self-pay | Admitting: *Deleted

## 2020-08-16 NOTE — Telephone Encounter (Signed)
Call received from patient requesting a phlebotomy from lab work drawn in 03/2020 per order of a NP that is placing testosterone pellets in him.  Pt informed that he will need to come in for lab work and to see a provider prior to a phlebotomy since he has not been in this office since 02/2019.  Pt is agreeable to this plan.  Message sent to scheduling.

## 2020-08-19 ENCOUNTER — Telehealth: Payer: Self-pay

## 2020-08-19 NOTE — Telephone Encounter (Signed)
Per sch message, appts made at next avail and pt was left a vm  Valentine Kuechle

## 2020-08-27 ENCOUNTER — Other Ambulatory Visit: Payer: Self-pay | Admitting: Adult Health

## 2020-08-27 ENCOUNTER — Other Ambulatory Visit: Payer: Self-pay | Admitting: Internal Medicine

## 2020-09-02 ENCOUNTER — Other Ambulatory Visit: Payer: Self-pay | Admitting: Internal Medicine

## 2020-09-02 ENCOUNTER — Other Ambulatory Visit: Payer: Self-pay | Admitting: Family

## 2020-09-02 ENCOUNTER — Encounter: Payer: BC Managed Care – PPO | Admitting: Internal Medicine

## 2020-09-02 DIAGNOSIS — E349 Endocrine disorder, unspecified: Secondary | ICD-10-CM

## 2020-09-02 DIAGNOSIS — D580 Hereditary spherocytosis: Secondary | ICD-10-CM

## 2020-09-02 DIAGNOSIS — D751 Secondary polycythemia: Secondary | ICD-10-CM

## 2020-09-02 MED ORDER — TADALAFIL 20 MG PO TABS: ORAL_TABLET | ORAL | 0 refills | Status: DC

## 2020-09-03 ENCOUNTER — Inpatient Hospital Stay: Payer: BC Managed Care – PPO | Admitting: Family

## 2020-09-03 ENCOUNTER — Inpatient Hospital Stay: Payer: BC Managed Care – PPO

## 2020-09-03 ENCOUNTER — Inpatient Hospital Stay: Payer: BC Managed Care – PPO | Attending: Internal Medicine

## 2020-09-04 ENCOUNTER — Telehealth: Payer: Self-pay

## 2020-09-04 NOTE — Telephone Encounter (Signed)
Called and left a vm with a new appt per no show sch message   Noah Dennis

## 2020-09-18 DIAGNOSIS — E669 Obesity, unspecified: Secondary | ICD-10-CM | POA: Diagnosis not present

## 2020-09-18 DIAGNOSIS — E291 Testicular hypofunction: Secondary | ICD-10-CM | POA: Diagnosis not present

## 2020-09-18 DIAGNOSIS — R5383 Other fatigue: Secondary | ICD-10-CM | POA: Diagnosis not present

## 2020-09-18 DIAGNOSIS — E668 Other obesity: Secondary | ICD-10-CM | POA: Diagnosis not present

## 2020-09-18 DIAGNOSIS — R6882 Decreased libido: Secondary | ICD-10-CM | POA: Diagnosis not present

## 2020-09-18 DIAGNOSIS — D518 Other vitamin B12 deficiency anemias: Secondary | ICD-10-CM | POA: Diagnosis not present

## 2020-09-20 ENCOUNTER — Other Ambulatory Visit: Payer: Self-pay

## 2020-09-20 ENCOUNTER — Ambulatory Visit (INDEPENDENT_AMBULATORY_CARE_PROVIDER_SITE_OTHER): Payer: BC Managed Care – PPO | Admitting: Adult Health

## 2020-09-20 ENCOUNTER — Encounter: Payer: Self-pay | Admitting: Adult Health

## 2020-09-20 VITALS — BP 132/82 | HR 89 | Temp 97.5°F | Wt 224.0 lb

## 2020-09-20 DIAGNOSIS — L659 Nonscarring hair loss, unspecified: Secondary | ICD-10-CM | POA: Insufficient documentation

## 2020-09-20 MED ORDER — MINOXIDIL FOR MEN 5 % EX FOAM: CUTANEOUS | 0 refills | Status: DC

## 2020-09-20 NOTE — Patient Instructions (Signed)
Hair skin and nails supplement - Saw palmetto   Minoxidil topical solution or foam What is this medicine? MINOXIDIL (mi NOX i dill) is used to increase new hair growth in cases of hereditary hair loss. When applied to the scalp, this medicine can promote hair growth in men with male pattern baldness. This medicine can also help women with thin hair and frontal hair loss. Women should not use extra-strength minoxidil products. This medicine may be used for other purposes; ask your health care provider or pharmacist if you have questions.  What should I tell my health care provider before I take this medicine? They need to know if you have any of these conditions:  frontal hair loss or receding hairline  no family history of hair loss  sudden or patchy hair loss  unknown reason for hair loss  your scalp is red, inflamed, infected or painful  an unusual or allergic reaction to minoxidil, other medicines, foods, dyes, or preservatives  pregnant or trying to get pregnant  breast-feeding How should I use this medicine? This medicine is for external use on the scalp only. Do not take by mouth. Follow the directions that come with the product. The hair and scalp should be dry before you use this medicine. You do not need to shampoo your hair before each application. Do not use more often than directed. Talk to your pediatrician regarding the use of this medicine in children. This medicine is not approved for use in children. Overdosage: If you think you have taken too much of this medicine contact a poison control center or emergency room at once. NOTE: This medicine is only for you. Do not share this medicine with others. What if I miss a dose? If you miss a dose, use it as soon as you can. If it is almost time for your next dose, use only that dose. Do not use double or extra doses. What may interact with this medicine? Interactions are not expected. Do not use any other medicines on  the scalp without asking your doctor or health care professional. This list may not describe all possible interactions. Give your health care provider a list of all the medicines, herbs, non-prescription drugs, or dietary supplements you use. Also tell them if you smoke, drink alcohol, or use illegal drugs. Some items may interact with your medicine.  What should I watch for while using this medicine? This medicine must be used on a regular basis for your hair to regrow. It may take 2 to 4 months of regular use before you notice any improvement. It is important to continue to use this product to maintain regrowth of hair. Once you stop using it, the regrown hair will usually fall out within 3 months. If you do not see any new hair growth after 4 months, stop using this product and contact your doctor or health care professional. Do not get this medicine in your eyes, nose, mouth, or on other sensitive areas. If you do, rinse off with plenty of cool tap water. Always wash your hands after use. Do not apply if your scalp is cut, scraped, or sunburned. Some people may notice changes in hair color or texture after using this medicine. What side effects may I notice from receiving this medicine? Side effects that you should report to your doctor or health care professional as soon as possible:  chest pain or palpitations  dizziness or fainting  skin rash, blisters, or itching  sudden weight gain  swelling of the hands or feet Side effects that usually do not require medical attention (report to your doctor or health care professional if they continue or are bothersome):  headache  redness, irritation and itching at the site of application  unusual hair growth, on the face, arm, and back This list may not describe all possible side effects. Call your doctor for medical advice about side effects. You may report side effects to FDA at 1-800-FDA-1088. Where should I keep my medicine? Keep out of  the reach of children. Store at room temperature between 20 and 25 degrees C (68 and 77 degrees F). Some products may be flammable. Keep away from heat, fire or flame. Throw away any unused medicine after the expiration date. NOTE: This sheet is a summary. It may not cover all possible information. If you have questions about this medicine, talk to your doctor, pharmacist, or health care provider.  2021 Elsevier/Gold Standard (2007-10-31 14:26:21)

## 2020-09-20 NOTE — Progress Notes (Signed)
Assessment and Plan:  Noah Dennis was seen today for alopecia.  Diagnoses and all orders for this visit:  Non-scarring hair loss Appears baseline androgenic hair loss with possible superimposed telogen effluvium Continue finasteride; discuss with testosterone provider Telogen effluvium typical course discussed Will try minoxidil topical x 3-4 months; if no improvement can refer to specialist Follow up as scheduled or with new concerns -     Minoxidil (MINOXIDIL FOR MEN) 5 % FOAM; Apply 1/2 capful of foam to scalp twice daily. Wash hands and any unwanted medication if touches area not wishing hair growth. Stop treatment if no improvement after 4 months.  Further disposition pending results of labs. Discussed med's effects and SE's.   Over 20 minutes of exam, counseling, chart review, and critical decision making was performed.   Future Appointments  Date Time Provider Department Center  09/25/2020  1:15 PM [facility] None  09/25/2020  1:45 PM [place], Sarah M, NP [facility] None  09/25/2020  2:30 PM [facility] B1 [facility] None  10/07/2020 11:00 AM [name], MD [facility] None    ------------------------------------------------------------------------------------------------------------------   HPI BP 132/82   Pulse 89   Temp (!) 97.5 F (36.4 C)   Wt 224 lb (101.6 kg)   SpO2 97%   BMI 33.08 kg/m   50 y.o.male with ED/hypogonadismpresents for evaluation of hair loss.   Noah Dennis repots 3 weeks or so Noah Dennis noted abrupt hair loss, generalized, though note more progressive in androgenic distribution. Aesthetician noted hair loss as well and advised Noah Dennis seek evaluation.   Notably newly following with testosterone def, recently switched from our office to Testosterone office 5 weeks ago, reports had implanted testosterone at that time, reports has "full labs" <2 weeks ago. Noah Dennis has also been on finasteride 5 mg for many years. Noah Dennis is on zinc supplement. Noah Dennis did have covid 19 in Feb 2022. Denies  family hx of alopecia.   Past Medical History:  Diagnosis Date  . 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 Hyperlipidemia   . Hypertension   . Hypogonadism male   . Insulin resistance   . Pre-diabetes      No Known Allergies  Current Outpatient Medications on File Prior to Visit  Medication Sig  . Cholecalciferol (VITAMIN D-3) 5000 units TABS Take 5,000 Units by mouth 2 (two) times daily.  Marland Kitchen doxycycline (VIBRAMYCIN) 100 MG capsule TAKE 1 CAPSULE BY MOUTH TWICE DAILY WITH FOOD FOR SKIN INFECTION  . finasteride (PROSCAR) 5 MG tablet Take 1 tablet  Daily  for Prostate  . metFORMIN (GLUCOPHAGE-XR) 500 MG 24 hr tablet Take  2 tablets  2 x /day with Meals  for Diabetes  . OZEMPIC, 1 MG/DOSE, 4 MG/3ML SOPN INJECT 1MG  INTO THE SKIN ONCE A WEEK  . rosuvastatin (CRESTOR) 40 MG tablet Take 1 tablet (40 mg total) by mouth daily.  . tadalafil (CIALIS) 20 MG tablet Take  1/2 to 1 tablet  every 2 to 3 days  as needed for XXXX  . testosterone cypionate (DEPOTESTOSTERONE CYPIONATE) 200 MG/ML injection INJECT INTO THE MUSCLE EVERY WEEK  . Zinc 50 MG CAPS Take by mouth daily.  . ASPIRIN 81 PO Take by mouth daily. (Patient not taking: Reported on 09/20/2020)  . lisinopril (ZESTRIL) 20 MG tablet Take 1 tablet  Daily  for BP (Patient not taking: Reported on 09/20/2020)  . phentermine (ADIPEX-P) 37.5 MG tablet TAKE 1/2 TO 1 TABLET BY MOUTH ONCE DAILY AS NEEDED FOR WEIGHT LOSS. TAKE REGULAR DRUG BREAKS. (Patient  not taking: Reported on 09/20/2020)   No current facility-administered medications on file prior to visit.    ROS: all negative except above.   Physical Exam:  BP 132/82   Pulse 89   Temp (!) 97.5 F (36.4 C)   Wt 224 lb (101.6 kg)   SpO2 97%   BMI 33.08 kg/m   General Appearance: Well nourished, in no apparent distress. Eyes: conjunctiva no swelling or erythema ENT/Mouth: mask in place; Hearing normal.  Neck: Supple, thyroid normal.  Respiratory: Respiratory effort  normal, BS equal bilaterally without rales, rhonchi, wheezing or stridor.  Cardio: RRR with no MRGs.  Lymphatics: Non tender without lymphadenopathy.  Musculoskeletal: normal gait.  Skin: Warm, dry without rashes, lesions, ecchymosis. Diffuse hair thinning with more advanced in androgenic pattern, spared at occiput, no erythema, flaking, scarring. Noah Dennis has apparent new hair growth at hairline Neuro: Normal muscle tone Psych: Awake and oriented X 3, normal affect, Insight and Judgment appropriate.     Dan Maker, NP 9:53 AM [facility]

## 2020-09-24 ENCOUNTER — Other Ambulatory Visit: Payer: Self-pay

## 2020-09-24 ENCOUNTER — Telehealth: Payer: Self-pay

## 2020-09-24 DIAGNOSIS — L659 Nonscarring hair loss, unspecified: Secondary | ICD-10-CM

## 2020-09-24 MED ORDER — MINOXIDIL FOR MEN 5 % EX FOAM: CUTANEOUS | 0 refills | Status: DC

## 2020-09-24 NOTE — Progress Notes (Signed)
Patient requesting Minoxidil be resent to [facility] in [place]. States that [facility] only carries the one for women.

## 2020-09-24 NOTE — Telephone Encounter (Signed)
Returned pts call to cancel his 09/25/20 appts, pt states he had blood work with his PCP and all levels were good,  He req not to r/s at this time a and states he will call back when needed   Noah Dennis

## 2020-09-25 ENCOUNTER — Inpatient Hospital Stay: Payer: BC Managed Care – PPO | Admitting: Family

## 2020-09-25 ENCOUNTER — Inpatient Hospital Stay: Payer: BC Managed Care – PPO

## 2020-09-26 DIAGNOSIS — E291 Testicular hypofunction: Secondary | ICD-10-CM | POA: Diagnosis not present

## 2020-09-26 DIAGNOSIS — R6882 Decreased libido: Secondary | ICD-10-CM | POA: Diagnosis not present

## 2020-09-26 DIAGNOSIS — R5383 Other fatigue: Secondary | ICD-10-CM | POA: Diagnosis not present

## 2020-10-06 NOTE — Progress Notes (Signed)
Annual  Screening/Preventative Visit  & Comprehensive Evaluation & Examination  Future Appointments  Date Time Provider Department Center  10/07/2020 11:00 AM [name], MD [facility] None  10/07/2021  2:00 PM [name], MD [facility] None            This very nice 50 y.o. single WM presents for a Screening /Preventative Visit & comprehensive evaluation and management of multiple medical co-morbidities.  Patient has been followed for HTN, HLD, T2_NIDDM , Testosterone Deficiency and Vitamin D Deficiency. Patient has hx/o Hereditary Spherocytosis - post splenectomy.           Patient has recently gone to a "testosterone climnic " in [place] for testosterone pellets and he's decided after the 6 month interval to resume Depo-T injections 1 cc weekly & will call for refill in about 4 month. The NP there also gave me a Rx for Saxenda inj daily and when he completed that , he will call for renewal of his Ozempic weekly increasing to 2 mg dosing.        Labs done last month at another provider included Nl CBC, CMET with elevated Glu= 150 mg% and A1c 6.3%. PSA was normal . Vit D was 71. Vit B12 was 799. Lipid profile showed total Chol 138, HDL 31, LDL 88.        HTN predates since 2005 . Patient's BP has been controlled at home.  Today's BP is at goal - 122/82. Patient denies any cardiac symptoms as chest pain, palpitations, shortness of breath, dizziness or ankle swelling.       Patient's hyperlipidemia is controlled with diet and Rosuvastatin. Patient denies myalgias or other medication SE's. Last lipids were at goal:  Lab Results  Component Value Date   CHOL 87 04/08/2020   HDL 34 (L) 04/08/2020   LDLCALC 35 04/08/2020   TRIG 92 04/08/2020   CHOLHDL 2.6 04/08/2020         Patient has hx/o Morbid Obesity and  PreDM/Insulin Resistance   (A1c 4.8% / Insulin 77 / 2011 and A1c 5.0% / Insulin 133 / 2014) and transitioning to T2_NIDDM (A1c 7.4% /2019) /CKD2  (GFR 77) and  patient denies reactive hypoglycemic symptoms, visual blurring, diabetic polys or paresthesias. Last A1c was 6.6% in Dec 2021 and not at goal . He had recent A1c 6.3%  in May at another provider.    Lab Results  Component Value Date   HGBA1C 6.6 (H) 04/08/2020                                                 Patient has Testosterone Deficiency ("185" / 2013 and "239" / 2015) treated initially with  Androgel and subsequently switched to Depo-Testosterone due to expense of the Gel.   Patient had developed polycythemia attributed to the testosterone. He does have hx/o splenectomy for Hereditary Spherocytosis.              Finally, patient has history of Vitamin D Deficiency ("40" /2010) and last vitamin D was borderline elevated:   Lab Results  Component Value Date   VD25OH 105 (H) 04/08/2020     Current Outpatient Medications on File Prior to Visit  Medication Sig   ITAMIN D  5000 units TABS Take  2  times daily.   doxycycline 100 MG capsule TAKE 1 CAPSULE TWICE DAILY  finasteride 5 MG tablet Take 1 tablet  Daily     metFORMIN-XR) 500 MG  Take  2 tablets  2 x /day with Meals    OZEMPIC, 1 MG/DOSE, 4 MG/3ML SOPN INJECT 1MG  INTO THE SKIN ONCE A WEEK   rosuvastatin (CRESTOR) 40 MG tablet Take 1 tablet daily.   tadalafil (CIALIS) 20 MG tablet Take  1/2 to 1 tablet  every 2 to 3 days  as needed    testosterone cypio 200 MG/ML injection INJECT 1 ML IM EVERY WEEK - not taking    Zinc 50 MG CAPS Take  daily.    No Known Allergies   Past Medical History:  Diagnosis Date   Erythrocytosis due to endocrine disorders 10/25/2018   Hyperlipidemia    Hypertension    Hypogonadism male    Insulin resistance    Pre-diabetes      Health Maintenance  Topic Date Due   COVID-19 Vaccine (1) Never done   Pneumococcal Vaccine 48-36 Years old (1 - PCV) Never done   OPHTHALMOLOGY EXAM  Never done   Zoster Vaccines- Shingrix (1 of 2) Never done   COLONOSCOPY  Never done   FOOT EXAM  08/28/2020    HEMOGLOBIN A1C  10/07/2020   INFLUENZA VACCINE  11/25/2020   TETANUS/TDAP  05/09/2024   PNEUMOCOCCAL POLYSACCHARIDE VACCINE AGE 66-64 HIGH RISK  Completed   Hepatitis C Screening  Completed   HIV Screening  Completed   HPV VACCINES  Aged Out     Immunization History  Administered Date(s) Administered   PPD Test 06/15/2016, 07/29/2017, 08/18/2018, 08/30/2019   Pneumococcal -13 07/29/2017   Pneumococcal -23 01/08/2010, 05/09/2014   Td 10/20/2004   Tdap 05/09/2014   Zoster, Live 06/05/2015    Last Colon - never   Past Surgical History:  Procedure Laterality Date   SPLENECTOMY       Family History  Problem Relation Age of Onset   Diabetes Mother    Hypertension Mother    Heart disease Mother    Hypertension Father    Cancer Father     Social History   Socioeconomic History   Marital status: Single  Occupational History   08/03/2015 & [employer] Medicare Insurance  Tobacco Use   Smoking status: Never   Smokeless tobacco: Never  Vaping Use   Vaping Use: Never used  Substance and Sexual Activity   Alcohol use: Yes    Comment: occasionally   Drug use: No   Sexual activity: Not on file      ROS Constitutional: Denies fever, chills, weight loss/gain, headaches, insomnia,  night sweats or change in appetite. Does c/o fatigue. Eyes: Denies redness, blurred vision, diplopia, discharge, itchy or watery eyes.  ENT: Denies discharge, congestion, post nasal drip, epistaxis, sore throat, earache, hearing loss, dental pain, Tinnitus, Vertigo, Sinus pain or snoring.  Cardio: Denies chest pain, palpitations, irregular heartbeat, syncope, dyspnea, diaphoresis, orthopnea, PND, claudication or edema Respiratory: denies cough, dyspnea, DOE, pleurisy, hoarseness, laryngitis or wheezing.  Gastrointestinal: Denies dysphagia, heartburn, reflux, water brash, pain, cramps, nausea, vomiting, bloating, diarrhea, constipation, hematemesis, melena, hematochezia, jaundice or  hemorrhoids Genitourinary: Denies dysuria, frequency, urgency, nocturia, hesitancy, discharge, hematuria or flank pain Musculoskeletal: Denies arthralgia, myalgia, stiffness, Jt. Swelling, pain, limp or strain/sprain. Denies Falls. Skin: Denies puritis, rash, hives, warts, acne, eczema or change in skin lesion Neuro: No weakness, tremor, incoordination, spasms, paresthesia or pain Psychiatric: Denies confusion, memory loss or sensory loss. Denies Depression. Endocrine: Denies change in weight, skin, hair change,  nocturia, and paresthesia, diabetic polys, visual blurring or hyper / hypo glycemic episodes.  Heme/Lymph: No excessive bleeding, bruising or enlarged lymph nodes.   Physical Exam  BP 122/82 (BP Location: Right Arm, Patient Position: Sitting, Cuff Size: Normal)   Pulse 99   Temp 97.7 F (36.5 C)   Resp 17   Ht 5\' 9"  (1.753 m)   Wt 223 lb 6.4 oz (101.3 kg)   SpO2 99%   BMI 32.99 kg/m   General Appearance: Well nourished and well groomed and in no apparent distress.  Eyes: PERRLA, EOMs, conjunctiva no swelling or erythema, normal fundi and vessels. Sinuses: No frontal/maxillary tenderness ENT/Mouth: EACs patent / TMs  nl. Nares clear without erythema, swelling, mucoid exudates. Oral hygiene is good. No erythema, swelling, or exudate. Tongue normal, non-obstructing. Tonsils not swollen or erythematous. Hearing normal.  Neck: Supple, thyroid not palpable. No bruits, nodes or JVD. Respiratory: Respiratory effort normal.  BS equal and clear bilateral without rales, rhonci, wheezing or stridor. Cardio: Heart sounds are normal with regular rate and rhythm and no murmurs, rubs or gallops. Peripheral pulses are normal and equal bilaterally without edema. No aortic or femoral bruits. Chest: symmetric with normal excursions and percussion.  Abdomen: Soft, with Nl bowel sounds. Nontender, no guarding, rebound, hernias, masses, or organomegaly.  Lymphatics: Non tender without  lymphadenopathy.  Musculoskeletal: Full ROM all peripheral extremities, joint stability, 5/5 strength, and normal gait. Skin: Warm and dry without rashes, lesions, cyanosis, clubbing or  ecchymosis.  Neuro: Cranial nerves intact, reflexes equal bilaterally. Normal muscle tone, no cerebellar symptoms. Sensation intact.  Pysch: Alert and oriented X 3 with normal affect, insight and judgment appropriate.   Assessment and Plan  1. Annual Preventative/Screening Exam   - PPD  2. Essential hypertension   3. Hyperlipidemia associated with type 2 diabetes mellitus (HCC)   4. Type 2 diabetes mellitus with stage 2 chronic kidney  disease, without long-term current use of insulin (HCC)   5. Vitamin D deficiency   6. Testosterone deficiency   7. Screening for colorectal cancer  - recc Cologard   8. Screening for ischemic heart disease   9. FHx: heart disease   10. Screening examination for pulmonary tuberculosis   11. Fatigue   12. Morbid obesity (BMI 34.31)   13. Medication management   Labs reviewed from the [place] clinic.          Patient was counseled in prudent diet, weight control to achieve/maintain BMI less than 25, BP monitoring, regular exercise and medications as discussed.  Discussed med effects and SE's. Routine screening labs and tests as requested with regular follow-up as recommended. Over 40 minutes of exam, counseling, chart review and high complex critical decision making was performed   , MD

## 2020-10-06 NOTE — Patient Instructions (Signed)

## 2020-10-07 ENCOUNTER — Ambulatory Visit (INDEPENDENT_AMBULATORY_CARE_PROVIDER_SITE_OTHER): Payer: BC Managed Care – PPO | Admitting: Internal Medicine

## 2020-10-07 ENCOUNTER — Other Ambulatory Visit: Payer: Self-pay

## 2020-10-07 ENCOUNTER — Encounter: Payer: Self-pay | Admitting: Internal Medicine

## 2020-10-07 VITALS — BP 122/82 | HR 99 | Temp 97.7°F | Resp 17 | Ht 69.0 in | Wt 223.4 lb

## 2020-10-07 DIAGNOSIS — Z136 Encounter for screening for cardiovascular disorders: Secondary | ICD-10-CM | POA: Diagnosis not present

## 2020-10-07 DIAGNOSIS — Z8249 Family history of ischemic heart disease and other diseases of the circulatory system: Secondary | ICD-10-CM

## 2020-10-07 DIAGNOSIS — Z111 Encounter for screening for respiratory tuberculosis: Secondary | ICD-10-CM

## 2020-10-07 DIAGNOSIS — I10 Essential (primary) hypertension: Secondary | ICD-10-CM | POA: Diagnosis not present

## 2020-10-07 DIAGNOSIS — E349 Endocrine disorder, unspecified: Secondary | ICD-10-CM

## 2020-10-07 DIAGNOSIS — R5383 Other fatigue: Secondary | ICD-10-CM

## 2020-10-07 DIAGNOSIS — Z0000 Encounter for general adult medical examination without abnormal findings: Secondary | ICD-10-CM | POA: Diagnosis not present

## 2020-10-07 DIAGNOSIS — Z1212 Encounter for screening for malignant neoplasm of rectum: Secondary | ICD-10-CM

## 2020-10-07 DIAGNOSIS — Z1211 Encounter for screening for malignant neoplasm of colon: Secondary | ICD-10-CM

## 2020-10-07 DIAGNOSIS — E1122 Type 2 diabetes mellitus with diabetic chronic kidney disease: Secondary | ICD-10-CM

## 2020-10-07 DIAGNOSIS — Z79899 Other long term (current) drug therapy: Secondary | ICD-10-CM

## 2020-10-07 DIAGNOSIS — Z0001 Encounter for general adult medical examination with abnormal findings: Secondary | ICD-10-CM

## 2020-10-07 DIAGNOSIS — E1169 Type 2 diabetes mellitus with other specified complication: Secondary | ICD-10-CM

## 2020-10-07 DIAGNOSIS — E559 Vitamin D deficiency, unspecified: Secondary | ICD-10-CM

## 2020-10-23 ENCOUNTER — Other Ambulatory Visit: Payer: Self-pay | Admitting: Adult Health

## 2020-10-23 ENCOUNTER — Other Ambulatory Visit: Payer: Self-pay | Admitting: Internal Medicine

## 2020-10-23 DIAGNOSIS — L719 Rosacea, unspecified: Secondary | ICD-10-CM

## 2020-10-23 MED ORDER — TADALAFIL 20 MG PO TABS: ORAL_TABLET | ORAL | 2 refills | Status: DC

## 2020-12-10 ENCOUNTER — Other Ambulatory Visit: Payer: Self-pay | Admitting: Internal Medicine

## 2020-12-10 DIAGNOSIS — N182 Chronic kidney disease, stage 2 (mild): Secondary | ICD-10-CM

## 2020-12-10 DIAGNOSIS — E1122 Type 2 diabetes mellitus with diabetic chronic kidney disease: Secondary | ICD-10-CM

## 2020-12-10 MED ORDER — OZEMPIC (2 MG/DOSE) 8 MG/3ML ~~LOC~~ SOPN: 2.0000 mg | PEN_INJECTOR | SUBCUTANEOUS | 3 refills | Status: DC

## 2021-01-07 DIAGNOSIS — R6882 Decreased libido: Secondary | ICD-10-CM | POA: Diagnosis not present

## 2021-01-07 DIAGNOSIS — R7303 Prediabetes: Secondary | ICD-10-CM | POA: Diagnosis not present

## 2021-01-07 DIAGNOSIS — R799 Abnormal finding of blood chemistry, unspecified: Secondary | ICD-10-CM | POA: Diagnosis not present

## 2021-01-07 DIAGNOSIS — E291 Testicular hypofunction: Secondary | ICD-10-CM | POA: Diagnosis not present

## 2021-01-07 DIAGNOSIS — R5383 Other fatigue: Secondary | ICD-10-CM | POA: Diagnosis not present

## 2021-01-07 DIAGNOSIS — R946 Abnormal results of thyroid function studies: Secondary | ICD-10-CM | POA: Diagnosis not present

## 2021-01-07 DIAGNOSIS — D518 Other vitamin B12 deficiency anemias: Secondary | ICD-10-CM | POA: Diagnosis not present

## 2021-01-14 DIAGNOSIS — E119 Type 2 diabetes mellitus without complications: Secondary | ICD-10-CM | POA: Diagnosis not present

## 2021-01-14 DIAGNOSIS — E291 Testicular hypofunction: Secondary | ICD-10-CM | POA: Diagnosis not present

## 2021-01-14 DIAGNOSIS — R799 Abnormal finding of blood chemistry, unspecified: Secondary | ICD-10-CM | POA: Diagnosis not present

## 2021-01-14 DIAGNOSIS — R946 Abnormal results of thyroid function studies: Secondary | ICD-10-CM | POA: Diagnosis not present

## 2021-01-21 ENCOUNTER — Other Ambulatory Visit: Payer: Self-pay | Admitting: Internal Medicine

## 2021-01-21 DIAGNOSIS — E119 Type 2 diabetes mellitus without complications: Secondary | ICD-10-CM

## 2021-02-06 ENCOUNTER — Encounter: Payer: Self-pay | Admitting: Internal Medicine

## 2021-02-06 ENCOUNTER — Ambulatory Visit: Payer: BC Managed Care – PPO | Admitting: Internal Medicine

## 2021-02-06 ENCOUNTER — Other Ambulatory Visit: Payer: Self-pay

## 2021-02-06 VITALS — BP 122/84 | HR 98 | Temp 97.6°F | Resp 16 | Ht 69.0 in | Wt 228.4 lb

## 2021-02-06 DIAGNOSIS — E349 Endocrine disorder, unspecified: Secondary | ICD-10-CM

## 2021-02-06 DIAGNOSIS — I10 Essential (primary) hypertension: Secondary | ICD-10-CM | POA: Diagnosis not present

## 2021-02-06 DIAGNOSIS — N182 Chronic kidney disease, stage 2 (mild): Secondary | ICD-10-CM

## 2021-02-06 DIAGNOSIS — E1169 Type 2 diabetes mellitus with other specified complication: Secondary | ICD-10-CM

## 2021-02-06 DIAGNOSIS — E785 Hyperlipidemia, unspecified: Secondary | ICD-10-CM

## 2021-02-06 DIAGNOSIS — E1122 Type 2 diabetes mellitus with diabetic chronic kidney disease: Secondary | ICD-10-CM | POA: Diagnosis not present

## 2021-02-06 DIAGNOSIS — Z79899 Other long term (current) drug therapy: Secondary | ICD-10-CM

## 2021-02-06 DIAGNOSIS — E559 Vitamin D deficiency, unspecified: Secondary | ICD-10-CM

## 2021-02-06 MED ORDER — MOUNJARO 15 MG/0.5ML ~~LOC~~ SOAJ: 15.0000 mg | SUBCUTANEOUS | 3 refills | Status: DC

## 2021-02-06 NOTE — Progress Notes (Signed)
Future Appointments  Date Time Provider Department Center  02/06/2021  2:30 PM [name], MD [facility] None  10/07/2021      - CPE  2:00 PM [name], MD [facility] None    History of Present Illness:       This very nice 50 y.o. single WM presents for 4 month follow up with HTN, HLD, T2_NIDDM,  Testosterone Deficiency  and Vitamin D Deficiency.  Patient wad been on daily Saxenda injections  for moderate obesity (BMI 33+)  via a weight loss clinic, then switched to Ozempic .        Patient is treated for HTN  (2005) & BP has been controlled at home. Today's BP is at goal - 122/84. Patient has had no complaints of any cardiac type chest pain, palpitations, dyspnea / orthopnea / PND, dizziness, claudication, or dependent edema.       Hyperlipidemia is controlled with diet & meds. Patient denies myalgias or other med SE's. Last Lipids were at goal:  Lab Results  Component Value Date   CHOL 87 04/08/2020   HDL 34 (L) 04/08/2020   LDLCALC 35 04/08/2020   TRIG 92 04/08/2020   CHOLHDL 2.6 04/08/2020     Also, the patient has history of   Morbid Obesity (BMI 35+) and  T2_NIDDM (A1c 7.4% /2019) /CKD2  (GFR 77).  Patient has had no symptoms of reactive hypoglycemia, diabetic polys, paresthesias or visual blurring.  Last A1c in this office was nearer goal. Patient  had recent A1c = 11.8 % at another provider's office about a month ago:  Lab Results  Component Value Date   HGBA1C 6.6 (H) 04/08/2020                                                 [[ copied: Patient has Testosterone Deficiency ("185" /2013 and "239" /2015) treated initially with  Androgel and subsequently switched to Depo-Testosterone due to expense of the Gel.   Patient had developed polycythemia attributed to the testosterone. He does have hx/o splenectomy for Hereditary Spherocytosis. ]]                                                       Further, the patient also has history of Vitamin D  Deficiency ("40" /2010) and supplements vitamin D without any suspected side-effects. Last vitamin D was at goal:   Lab Results  Component Value Date   VD25OH 105 (H) 04/08/2020     Current Outpatient Medications on File Prior to Visit  Medication Sig   VITAMIN D  5000 units T Take 5,000 Units  2  times daily.   doxycycline (VIBRAMYCIN) 100 MG capsule TAKE 1 CAPSULE  TWICE DAILY    finasteride  5 MG tablet TAKE 1 TABLET DAILY FOR PROSTATE   metFORMIN-XR 500 MG  TAKE 2 TABLETS TWICE DAILY    rosuvastatin  40 MG tablet Take 1 tablet daily.   SAXENDA 18 MG/3ML  Inject daily    tadalafil (CIALIS) 20 MG tablet Take 1/2 to 1 tablet every 2 to 3 days as needed for XXXX   testosterone cypionate 200 MG/ML inj INJECT  INTO THE MUSCLE EVERY WEEK   Zinc 50 MG CAPS Take  daily.      No Known Allergies   PMHx:   Past Medical History:  Diagnosis Date   Erythrocytosis due to endocrine disorders 10/25/2018   Hyperlipidemia    Hypertension    Hypogonadism male    Insulin resistance    Pre-diabetes      Immunization History  Administered Date(s) Administered   PPD Test 05/08/2013, 05/09/2014, 05/27/2015, 06/15/2016, 07/29/2017, 08/18/2018, 08/30/2019, 10/07/2020   Pneumococcal -13 07/29/2017   Pneumococcal -23 01/08/2010, 05/09/2014   Td 10/20/2004   Tdap 05/09/2014   Zoster, Live 06/05/2015     Past Surgical History:  Procedure Laterality Date   SPLENECTOMY      FHx:    Reviewed / unchanged  SHx:    Reviewed / unchanged   Systems Review:  Constitutional: Denies fever, chills, wt changes, headaches, insomnia, fatigue, night sweats, change in appetite. Eyes: Denies redness, blurred vision, diplopia, discharge, itchy, watery eyes.  ENT: Denies discharge, congestion, post nasal drip, epistaxis, sore throat, earache, hearing loss, dental pain, tinnitus, vertigo, sinus pain, snoring.  CV: Denies chest pain, palpitations, irregular heartbeat, syncope, dyspnea, diaphoresis,  orthopnea, PND, claudication or edema. Respiratory: denies cough, dyspnea, DOE, pleurisy, hoarseness, laryngitis, wheezing.  Gastrointestinal: Denies dysphagia, odynophagia, heartburn, reflux, water brash, abdominal pain or cramps, nausea, vomiting, bloating, diarrhea, constipation, hematemesis, melena, hematochezia  or hemorrhoids. Genitourinary: Denies dysuria, frequency, urgency, nocturia, hesitancy, discharge, hematuria or flank pain. Musculoskeletal: Denies arthralgias, myalgias, stiffness, jt. swelling, pain, limping or strain/sprain.  Skin: Denies pruritus, rash, hives, warts, acne, eczema or change in skin lesion(s). Neuro: No weakness, tremor, incoordination, spasms, paresthesia or pain. Psychiatric: Denies confusion, memory loss or sensory loss. Endo: Denies change in weight, skin or hair change.  Heme/Lymph: No excessive bleeding, bruising or enlarged lymph nodes.  Physical Exam  BP 122/84   Pulse 98   Temp 97.6 F (36.4 C)   Resp 16   Ht 5\' 9"  (1.753 m)   Wt 228 lb 6.4 oz (103.6 kg)   SpO2 98%   BMI 33.73 kg/m   Appears  well nourished, well groomed  and in no distress.  Eyes: PERRLA, EOMs, conjunctiva no swelling or erythema. Sinuses: No frontal/maxillary tenderness ENT/Mouth: EAC's clear, TM's nl w/o erythema, bulging. Nares clear w/o erythema, swelling, exudates. Oropharynx clear without erythema or exudates. Oral hygiene is good. Tongue normal, non obstructing. Hearing intact.  Neck: Supple. Thyroid not palpable. Car 2+/2+ without bruits, nodes or JVD. Chest: Respirations nl with BS clear & equal w/o rales, rhonchi, wheezing or stridor.  Cor: Heart sounds normal w/ regular rate and rhythm without sig. murmurs, gallops, clicks or rubs. Peripheral pulses normal and equal  without edema.  Abdomen: Soft & bowel sounds normal. Non-tender w/o guarding, rebound, hernias, masses or organomegaly.  Lymphatics: Unremarkable.  Musculoskeletal: Full ROM all peripheral  extremities, joint stability, 5/5 strength and normal gait.  Skin: Warm, dry without exposed rashes, lesions or ecchymosis apparent.  Neuro: Cranial nerves intact, reflexes equal bilaterally. Sensory-motor testing grossly intact. Tendon reflexes grossly intact.  Pysch: Alert & oriented x 3.  Insight and judgement nl & appropriate. No ideations.  Assessment and Plan:  1. Essential hypertension  - Continue medication, monitor blood pressure at home.  - Continue DASH diet.  Reminder to go to the ER if any CP,  SOB, nausea, dizziness, severe HA, changes vision/speech.    2. Hyperlipidemia associated with type 2 diabetes mellitus (HCC)  -  Continue diet/meds, exercise,& lifestyle modifications.  - Continue monitor periodic cholesterol/liver & renal functions   3. Type 2 diabetes mellitus with stage 2 chronic kidney  disease, without long-term current use of insulin (HCC)  - Continue diet, exercise  - Lifestyle modifications.  - Monitor appropriate labs  4. Vitamin D deficiency   - Continue supplementation.   5. Testosterone deficiency   6. Medication management         Discussed  regular exercise, BP monitoring, weight control to achieve/maintain BMI less than 25 and discussed med and SE's. Recommended labs to assess and monitor clinical status with further disposition pending results of labs.  I discussed the assessment and treatment plan with the patient. The patient was provided an opportunity to ask questions and all were answered. The patient agreed with the plan and demonstrated an understanding of the instructions.  I provided over 30 minutes of exam, counseling, chart review and  complex critical decision making.        The patient was advised to call back or seek an in-person evaluation if the symptoms worsen or if the condition fails to improve as anticipated.   Marinus Maw, MD

## 2021-02-06 NOTE — Patient Instructions (Signed)

## 2021-02-08 ENCOUNTER — Encounter: Payer: Self-pay | Admitting: Internal Medicine

## 2021-02-24 ENCOUNTER — Other Ambulatory Visit: Payer: Self-pay | Admitting: Internal Medicine

## 2021-02-24 DIAGNOSIS — K2971 Gastritis, unspecified, with bleeding: Secondary | ICD-10-CM | POA: Diagnosis not present

## 2021-02-24 DIAGNOSIS — R231 Pallor: Secondary | ICD-10-CM | POA: Diagnosis not present

## 2021-02-24 DIAGNOSIS — R000 Tachycardia, unspecified: Secondary | ICD-10-CM | POA: Diagnosis not present

## 2021-02-24 DIAGNOSIS — K920 Hematemesis: Secondary | ICD-10-CM | POA: Diagnosis not present

## 2021-02-24 DIAGNOSIS — I959 Hypotension, unspecified: Secondary | ICD-10-CM | POA: Diagnosis not present

## 2021-02-24 DIAGNOSIS — E119 Type 2 diabetes mellitus without complications: Secondary | ICD-10-CM | POA: Diagnosis not present

## 2021-02-24 DIAGNOSIS — K921 Melena: Secondary | ICD-10-CM | POA: Diagnosis not present

## 2021-02-24 DIAGNOSIS — R112 Nausea with vomiting, unspecified: Secondary | ICD-10-CM | POA: Diagnosis not present

## 2021-02-24 DIAGNOSIS — R739 Hyperglycemia, unspecified: Secondary | ICD-10-CM | POA: Diagnosis not present

## 2021-02-24 DIAGNOSIS — N179 Acute kidney failure, unspecified: Secondary | ICD-10-CM | POA: Diagnosis not present

## 2021-02-24 DIAGNOSIS — D62 Acute posthemorrhagic anemia: Secondary | ICD-10-CM | POA: Diagnosis not present

## 2021-02-24 DIAGNOSIS — R578 Other shock: Secondary | ICD-10-CM | POA: Diagnosis not present

## 2021-02-24 DIAGNOSIS — E1122 Type 2 diabetes mellitus with diabetic chronic kidney disease: Secondary | ICD-10-CM | POA: Diagnosis not present

## 2021-02-24 MED ORDER — PANTOPRAZOLE SODIUM 40 MG PO TBEC: DELAYED_RELEASE_TABLET | ORAL | 0 refills | Status: AC

## 2021-02-24 MED ORDER — ONDANSETRON HCL 8 MG PO TABS: ORAL_TABLET | ORAL | 1 refills | Status: DC

## 2021-02-25 DIAGNOSIS — R1111 Vomiting without nausea: Secondary | ICD-10-CM | POA: Diagnosis not present

## 2021-02-25 DIAGNOSIS — E119 Type 2 diabetes mellitus without complications: Secondary | ICD-10-CM | POA: Diagnosis not present

## 2021-02-25 DIAGNOSIS — D62 Acute posthemorrhagic anemia: Secondary | ICD-10-CM | POA: Diagnosis not present

## 2021-02-25 DIAGNOSIS — K4090 Unilateral inguinal hernia, without obstruction or gangrene, not specified as recurrent: Secondary | ICD-10-CM | POA: Diagnosis not present

## 2021-02-25 DIAGNOSIS — K920 Hematemesis: Secondary | ICD-10-CM | POA: Diagnosis not present

## 2021-02-25 DIAGNOSIS — E8770 Fluid overload, unspecified: Secondary | ICD-10-CM | POA: Diagnosis not present

## 2021-02-25 DIAGNOSIS — K429 Umbilical hernia without obstruction or gangrene: Secondary | ICD-10-CM | POA: Diagnosis not present

## 2021-02-25 DIAGNOSIS — K2210 Ulcer of esophagus without bleeding: Secondary | ICD-10-CM | POA: Diagnosis not present

## 2021-02-25 DIAGNOSIS — J9811 Atelectasis: Secondary | ICD-10-CM | POA: Diagnosis not present

## 2021-02-25 DIAGNOSIS — N179 Acute kidney failure, unspecified: Secondary | ICD-10-CM | POA: Diagnosis not present

## 2021-02-25 DIAGNOSIS — D72829 Elevated white blood cell count, unspecified: Secondary | ICD-10-CM | POA: Diagnosis not present

## 2021-02-25 DIAGNOSIS — K2211 Ulcer of esophagus with bleeding: Secondary | ICD-10-CM | POA: Diagnosis not present

## 2021-02-25 DIAGNOSIS — R578 Other shock: Secondary | ICD-10-CM | POA: Diagnosis not present

## 2021-02-25 DIAGNOSIS — N200 Calculus of kidney: Secondary | ICD-10-CM | POA: Diagnosis not present

## 2021-02-26 DIAGNOSIS — K922 Gastrointestinal hemorrhage, unspecified: Secondary | ICD-10-CM | POA: Diagnosis not present

## 2021-02-26 DIAGNOSIS — K920 Hematemesis: Secondary | ICD-10-CM | POA: Diagnosis not present

## 2021-02-28 DIAGNOSIS — K254 Chronic or unspecified gastric ulcer with hemorrhage: Secondary | ICD-10-CM | POA: Diagnosis not present

## 2021-03-06 ENCOUNTER — Encounter: Payer: Self-pay | Admitting: Internal Medicine

## 2021-03-18 DIAGNOSIS — R946 Abnormal results of thyroid function studies: Secondary | ICD-10-CM | POA: Diagnosis not present

## 2021-03-18 DIAGNOSIS — R7303 Prediabetes: Secondary | ICD-10-CM | POA: Diagnosis not present

## 2021-03-18 DIAGNOSIS — R6882 Decreased libido: Secondary | ICD-10-CM | POA: Diagnosis not present

## 2021-03-18 DIAGNOSIS — R799 Abnormal finding of blood chemistry, unspecified: Secondary | ICD-10-CM | POA: Diagnosis not present

## 2021-03-18 DIAGNOSIS — E291 Testicular hypofunction: Secondary | ICD-10-CM | POA: Diagnosis not present

## 2021-03-24 DIAGNOSIS — R799 Abnormal finding of blood chemistry, unspecified: Secondary | ICD-10-CM | POA: Diagnosis not present

## 2021-03-24 DIAGNOSIS — E291 Testicular hypofunction: Secondary | ICD-10-CM | POA: Diagnosis not present

## 2021-03-24 DIAGNOSIS — R6882 Decreased libido: Secondary | ICD-10-CM | POA: Diagnosis not present

## 2021-03-26 NOTE — Progress Notes (Signed)
Future Appointments  Date Time Provider  03/27/2021  3:30 PM Unk Pinto, MD  06/11/2021  2:30 PM Magda Bernheim, NP  10/07/2021  2:00 PM Unk Pinto, MD    History of Present Illness:     Patient is a very nice 50 yo single WM with HTN, HLD, T2_NIDDM , Testosterone Deficiency and Vitamin D Deficiency. Patient has hx/o Hereditary Spherocytosis - post splenectomy (1975). He presents today with concerns of elevated  platelet counts  from an integrative  medicine clinic from which he's also receiving testosterone injections as we had discontinued  his parenteral testosterone injections because worsening of erythrocytosis.                                Also, significant information that patient was hospitalized on 02/24/2021 T [facility] in [place] with an acute UGI bleed with multiple esophageal & gastric ulcers and he received 2 u pRBC. He was discharged on PPI .    Medications     metFORMIN-XR 500 MG 24 hr tabletTAKE 2 TABLETS  TWICE DAILY WITH MEALS    tirzepatide (MOUNJARO) 15 MG/0.5ML Pen, Inject 15 mg into the skin once a week. (Dx:  e11.29)    testosterone cypio 200 MG/ML injection, INJECT 1ML  IM  EVERY WEEK   rosuvastatin  40 MG tablet, Take 1 tablet  daily.   tadalafil 20 MG tablet, Take 1/2 to 1 tablet every 2 to 3 days as needed  VITAMIN D 5000 units TABS, Take  2 times daily.   doxycycline 100 MG capsule, TAKE 1 CAPSULE  TWICE DAILY    finasteride 5 MG tablet, TAKE 1 TABLET DAILY FOR PROSTATE   ondansetron  8 MG tablet, Take  1 tablet  2 to 3 x /day  as needed for Nausea   pantoprazole 40 MG tablet, Take  1 to 2  tablets  Daily    Zinc 50 MG CAPS, Take  daily.  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   Observations/Objective:  BP 140/84   Pulse 96   Temp 97.9 F (36.6 C)   Resp 18   Ht 5\' 9"  (1.753 m)   Wt 222 lb 12.8 oz (101.1 kg)   SpO2 99%   BMI 32.90 kg/m   HEENT - WNL. Neck - supple.  Chest - Clear equal BS. Cor - Nl HS. RRR w/o sig MGR. PP 1(+). No edema. MS- FROM w/o deformities.  Gait Nl. Neuro -  Nl w/o focal abnormalities.   Assessment and Plan:  1. Essential hypertension   2. Hyperlipidemia associated with type 2 diabetes mellitus ([place])   3. Type 2 diabetes mellitus with stage 2 chronic kidney disease, without long-term current use of insulin ([place])   4. Adverse reaction to intramuscular testosterone  - patient discouraged  from taking testosterone injections at the present time in the setting of elevated platelet counts & elevated RBC  ( prior to recent GI bleed )  Follow Up Instructions:  I discussed the assessment and treatment plan with the patient. The patient was provided an opportunity to ask questions and all were answered. The patient agreed with the plan and demonstrated an understanding of the instructions.       The patient was advised to call back or seek an in-person evaluation if the symptoms worsen or if the condition fails to improve as anticipated.   Marinus Maw, MD

## 2021-03-27 ENCOUNTER — Other Ambulatory Visit: Payer: Self-pay

## 2021-03-27 ENCOUNTER — Ambulatory Visit: Payer: BC Managed Care – PPO | Admitting: Internal Medicine

## 2021-03-27 ENCOUNTER — Encounter: Payer: Self-pay | Admitting: Internal Medicine

## 2021-03-27 VITALS — BP 140/84 | HR 96 | Temp 97.9°F | Resp 18 | Ht 69.0 in | Wt 222.8 lb

## 2021-03-27 DIAGNOSIS — E785 Hyperlipidemia, unspecified: Secondary | ICD-10-CM | POA: Diagnosis not present

## 2021-03-27 DIAGNOSIS — E1122 Type 2 diabetes mellitus with diabetic chronic kidney disease: Secondary | ICD-10-CM | POA: Diagnosis not present

## 2021-03-27 DIAGNOSIS — T387X5A Adverse effect of androgens and anabolic congeners, initial encounter: Secondary | ICD-10-CM

## 2021-03-27 DIAGNOSIS — E1169 Type 2 diabetes mellitus with other specified complication: Secondary | ICD-10-CM

## 2021-03-27 DIAGNOSIS — I10 Essential (primary) hypertension: Secondary | ICD-10-CM | POA: Diagnosis not present

## 2021-03-27 DIAGNOSIS — N182 Chronic kidney disease, stage 2 (mild): Secondary | ICD-10-CM

## 2021-03-28 DIAGNOSIS — E119 Type 2 diabetes mellitus without complications: Secondary | ICD-10-CM | POA: Diagnosis not present

## 2021-03-28 DIAGNOSIS — H524 Presbyopia: Secondary | ICD-10-CM | POA: Diagnosis not present

## 2021-03-28 DIAGNOSIS — H52203 Unspecified astigmatism, bilateral: Secondary | ICD-10-CM | POA: Diagnosis not present

## 2021-03-28 LAB — HM DIABETES EYE EXAM

## 2021-04-01 DIAGNOSIS — K2100 Gastro-esophageal reflux disease with esophagitis, without bleeding: Secondary | ICD-10-CM | POA: Diagnosis not present

## 2021-04-01 DIAGNOSIS — Z8719 Personal history of other diseases of the digestive system: Secondary | ICD-10-CM | POA: Diagnosis not present

## 2021-04-02 ENCOUNTER — Encounter: Payer: Self-pay | Admitting: Internal Medicine

## 2021-04-21 ENCOUNTER — Other Ambulatory Visit: Payer: Self-pay | Admitting: Internal Medicine

## 2021-04-21 DIAGNOSIS — L719 Rosacea, unspecified: Secondary | ICD-10-CM

## 2021-04-21 MED ORDER — DOXYCYCLINE HYCLATE 100 MG PO CAPS: ORAL_CAPSULE | ORAL | 0 refills | Status: DC

## 2021-04-30 ENCOUNTER — Telehealth: Payer: Self-pay

## 2021-04-30 NOTE — Telephone Encounter (Signed)
PA submitted via fax for Mounjaro 15 mg.

## 2021-05-03 ENCOUNTER — Other Ambulatory Visit: Payer: Self-pay | Admitting: Internal Medicine

## 2021-05-03 MED ORDER — TADALAFIL 20 MG PO TABS: ORAL_TABLET | ORAL | 0 refills | Status: DC

## 2021-05-05 DIAGNOSIS — R946 Abnormal results of thyroid function studies: Secondary | ICD-10-CM | POA: Diagnosis not present

## 2021-05-05 DIAGNOSIS — R799 Abnormal finding of blood chemistry, unspecified: Secondary | ICD-10-CM | POA: Diagnosis not present

## 2021-05-05 DIAGNOSIS — R6882 Decreased libido: Secondary | ICD-10-CM | POA: Diagnosis not present

## 2021-05-05 DIAGNOSIS — E291 Testicular hypofunction: Secondary | ICD-10-CM | POA: Diagnosis not present

## 2021-05-05 DIAGNOSIS — R7303 Prediabetes: Secondary | ICD-10-CM | POA: Diagnosis not present

## 2021-05-07 ENCOUNTER — Telehealth: Payer: Self-pay | Admitting: *Deleted

## 2021-05-07 ENCOUNTER — Telehealth: Payer: Self-pay | Admitting: Hematology & Oncology

## 2021-05-07 DIAGNOSIS — R5383 Other fatigue: Secondary | ICD-10-CM | POA: Diagnosis not present

## 2021-05-07 DIAGNOSIS — E291 Testicular hypofunction: Secondary | ICD-10-CM | POA: Diagnosis not present

## 2021-05-07 DIAGNOSIS — R6882 Decreased libido: Secondary | ICD-10-CM | POA: Diagnosis not present

## 2021-05-07 NOTE — Telephone Encounter (Signed)
Message received from patient stating that his platelet count is up and that he needs to make an appt to see a provider.  Message sent to scheduling.

## 2021-05-09 ENCOUNTER — Inpatient Hospital Stay: Payer: BC Managed Care – PPO | Attending: Family | Admitting: Family

## 2021-05-09 ENCOUNTER — Inpatient Hospital Stay: Payer: BC Managed Care – PPO

## 2021-05-09 ENCOUNTER — Encounter: Payer: Self-pay | Admitting: Family

## 2021-05-09 ENCOUNTER — Other Ambulatory Visit: Payer: Self-pay

## 2021-05-09 VITALS — BP 119/70 | HR 110 | Temp 97.7°F | Resp 18 | Ht 69.0 in | Wt 207.0 lb

## 2021-05-09 DIAGNOSIS — Z79899 Other long term (current) drug therapy: Secondary | ICD-10-CM | POA: Diagnosis not present

## 2021-05-09 DIAGNOSIS — Z79890 Hormone replacement therapy (postmenopausal): Secondary | ICD-10-CM | POA: Diagnosis not present

## 2021-05-09 DIAGNOSIS — D751 Secondary polycythemia: Secondary | ICD-10-CM | POA: Insufficient documentation

## 2021-05-09 DIAGNOSIS — E349 Endocrine disorder, unspecified: Secondary | ICD-10-CM | POA: Diagnosis not present

## 2021-05-09 DIAGNOSIS — D580 Hereditary spherocytosis: Secondary | ICD-10-CM | POA: Diagnosis not present

## 2021-05-09 NOTE — Progress Notes (Signed)
Hematology and Oncology Follow Up Visit  Laquavious Karpowicz [id number] March 16, 1971 51 y.o. 05/09/2021   Principle Diagnosis:  Hereditary spherocytosis with splenectomy  Erythrocytosis secondary to testosterone supplementation    Current Therapy:        Phlebotomy to maintain Hgb < 45%   Interim History:  Mr. Berkshire is here today for follow-up. He was hospitalized on 02/24/2021 with GI bleed. EGD showed multiple ulcers towards the lower esophagus. He is now on Protonix 40 mg PO BID.  He is now off of NSAIDs including aspirin.  No new episodes of blood loss since discharge. No abnormal bruising, no petechiae.  He is not currently on Testosterone injections but would really like to restart as it helps with his energy.  He is still anemia from his bleed. Hgb is 11.1, MCV 74, WBC count 7.7 and platelets 711.  His platelets are chronically elevated with splenectomy.  No fever, chills, n/v, cough, rash, dizziness, SOB, chest pain, palpitations, abdominal pain or changes in bowel or bladder habits.   No swelling, tenderness, numbness or tingling in his extremities.  No falls or syncope reported.  Appetite is good and he is hydrating. His weight is stable at 207. He has been going to the gym and working out to lose weight. His goal is to get to 200 lbs.   ECOG Performance Status: 1 - Symptomatic but completely ambulatory  Medications:  Allergies as of 05/09/2021   No Known Allergies      Medication List        Accurate as of May 09, 2021  1:59 PM. If you have any questions, ask your nurse or doctor.          doxycycline 100 MG capsule Commonly known as: VIBRAMYCIN Take  1 capsule Daily for Skin Infection                                                 /                        TAKE 1 CAPSULE BY MOUTH   finasteride 5 MG tablet Commonly known as: PROSCAR TAKE 1 TABLET BY MOUTH DAILY FOR PROSTATE   metFORMIN 500 MG 24 hr tablet Commonly known as: GLUCOPHAGE-XR TAKE 2 TABLETS BY MOUTH  TWICE DAILY WITH MEALS FOR DIABETES   Mounjaro 15 MG/0.5ML Pen Generic drug: tirzepatide Inject 15 mg into the skin once a week. (Dx:  e11.29)   ondansetron 8 MG tablet Commonly known as: Zofran Take  1 tablet  2 to 3 x /day  as needed for Nausea   pantoprazole 40 MG tablet Commonly known as: Protonix Take  1 to 2  tablets  Daily  for Indigestion & Acid Reflux   rosuvastatin 40 MG tablet Commonly known as: CRESTOR Take 1 tablet (40 mg total) by mouth daily.   tadalafil 20 MG tablet Commonly known as: Cialis Take  1/2 to 1 tablet  every 2 to 3 days  as needed for XXXX   testosterone cypionate 200 MG/ML injection Commonly known as: DEPOTESTOSTERONE CYPIONATE INJECT 1ML INTO THE MUSCLE EVERY WEEK   Vitamin D-3 125 MCG (5000 UT) Tabs Take 5,000 Units by mouth 2 (two) times daily.   Zinc 50 MG Caps Take by mouth daily.        Allergies: No  Known Allergies  Past Medical History, Surgical history, Social history, and Family History were reviewed and updated.  Review of Systems: All other 10 point review of systems is negative.   Physical Exam:  height is 5\' 9"  (1.753 m) and weight is 207 lb (93.9 kg). His oral temperature is 97.7 F (36.5 C). His blood pressure is 119/70 and his pulse is 110 (abnormal). His respiration is 18 and oxygen saturation is 100%.   Wt Readings from Last 3 Encounters:  05/09/21 207 lb (93.9 kg)  03/27/21 222 lb 12.8 oz (101.1 kg)  02/06/21 228 lb 6.4 oz (103.6 kg)    Ocular: Sclerae unicteric, pupils equal, round and reactive to light Ear-nose-throat: Oropharynx clear, dentition fair Lymphatic: No cervical or supraclavicular adenopathy Lungs no rales or rhonchi, good excursion bilaterally Heart regular rate and rhythm, no murmur appreciated Abd soft, nontender, positive bowel sounds MSK no focal spinal tenderness, no joint edema Neuro: non-focal, well-oriented, appropriate affect Breasts: Deferred   Lab Results  Component Value Date    WBC 13.6 (H) 04/08/2020   HGB 18.4 (H) 04/08/2020   HCT 51.8 (H) 04/08/2020   MCV 90.1 04/08/2020   PLT 567 (H) 04/08/2020   Lab Results  Component Value Date   FERRITIN 56 03/02/2019   IRON 126 08/30/2019   TIBC 341 08/30/2019   UIBC 192 03/02/2019   IRONPCTSAT 37 08/30/2019   Lab Results  Component Value Date   RETICCTPCT 2.9 10/25/2018   RBC 5.75 04/08/2020   No results found for: KPAFRELGTCHN, LAMBDASER, KAPLAMBRATIO No results found for: IGGSERUM, IGA, IGMSERUM No results found for: Odetta Pink, SPEI   Chemistry      Component Value Date/Time   NA 137 04/08/2020 1433   K 4.6 04/08/2020 1433   CL 100 04/08/2020 1433   CO2 26 04/08/2020 1433   BUN 15 04/08/2020 1433   CREATININE 1.21 04/08/2020 1433      Component Value Date/Time   CALCIUM 10.4 (H) 04/08/2020 1433   ALKPHOS 52 03/02/2019 1452   AST 18 04/08/2020 1433   AST 17 03/02/2019 1452   ALT 22 04/08/2020 1433   ALT 21 03/02/2019 1452   BILITOT 2.0 (H) 04/08/2020 1433   BILITOT 0.7 03/02/2019 1452       Impression and Plan: Mr. Coppin is a pleasant 51 yo caucasian gentleman with erythrocytosis secondary to testosterone supplementation as well as hereditary spherocytosis.  He is recuperating from his recent GI bleed and is still slightly anemic.  He will hold off on donating with the [facility] for right now. No phlebotomy needed.  He is taking an oral iron supplement once a day.  He will continue to have his lab work checked with his PCP and have results routed to our office.  Per MD patient is ok to restart his testosterone injections as they help improve his quality of life. He verbalized understanding that this does increase the change of stroke, heart attack and blood clot. Follow-up in 6 months.   Lottie Dawson, NP 1/13/20231:59 PM

## 2021-06-11 ENCOUNTER — Ambulatory Visit: Payer: Commercial Managed Care - PPO | Admitting: Nurse Practitioner

## 2021-06-17 NOTE — Progress Notes (Signed)
° ° °  Future Appointments  Date Time Provider Department  06/18/2021  9:30 AM Unk Pinto, MD [facility]  07/09/2021  3:00 PM Liane Comber, NP [facility]  10/07/2021  2:00 PM Unk Pinto, MD [facility]  11/05/2021  1:15 PM Celso Amy, NP [facility]    History of Present Illness:     Patient is a very nice 51 yo single WM presenting with concerns of possible STD exposure and is requesting testing.     Medications    metFORMIN-XR 500 MG , TAKE 2 TABLETS TWICE DAILY WITH MEALS FOR DIABETES   testosterone cypio 200 MG/ML injection, INJECT 1ML INTO THE MUSCLE EVERY WEEK   tirzepatide (MOUNJARO) 15 MG/0.5ML Pen, Inject 15 mg into the skin once a week. (Dx:  e11.29)   rosuvastatin (CRESTOR) 40 MG tablet, Take 1 tablet (40 mg total) by mouth daily.   tadalafil (CIALIS) 20 MG tablet, Take  1/2 to 1 tablet  every 2 to 3 days  as needed    Cholecalciferol (VITAMIN D-3) 5000 units TABS, Take 5,000 Units  2 times daily.   doxycycline (VIBRAMYCIN) 100 MG capsule, Take  1 capsule Daily    finasteride (PROSCAR) 5 MG tablet, TAKE 1 TABLET BY MOUTH DAILY    ondansetron (ZOFRAN) 8 MG tablet, Take  1 tablet  2 to 3 x /day  as needed for Nausea   pantoprazole (PROTONIX) 40 MG tablet, Take  1 to 2  tablets  Daily    Zinc 50 MG CAPS, Take daily.  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   Observations/Objective:  BP 118/68    Pulse 98    Temp 97.7 F (36.5  C)    Resp 16    Ht 5\' 9"  (1.753 m)    Wt 212 lb 6.4 oz (96.3 kg)    SpO2 98%    BMI 31.37 kg/m   HEENT - WNL. Neck - supple.  Chest - Clear equal BS. Cor - Nl HS. RRR w/o sig MGR. PP 1(+). No edema. MS- FROM w/o deformities.  Gait Nl. Neuro -  Nl w/o focal abnormalities.  Assessment and Plan:   1. Possible exposure to ST - RPR - HIV Antibody (routine testing w rflx) - HSV(herpes simplex vrs) 1+2 ab-IgG - C. trachomatis/N. gonorrhoeae RNA   Follow Up Instructions:        I discussed the assessment and treatment plan with the patient. The patient was provided an opportunity to ask questions and all were answered. The patient agreed with the plan and demonstrated an understanding of the instructions.       The patient was advised to call back or seek an in-person evaluation if the symptoms worsen or if the condition fails to improve as anticipated.    Kirtland Bouchard, MD

## 2021-06-18 ENCOUNTER — Other Ambulatory Visit: Payer: Self-pay

## 2021-06-18 ENCOUNTER — Ambulatory Visit (INDEPENDENT_AMBULATORY_CARE_PROVIDER_SITE_OTHER): Payer: BC Managed Care – PPO | Admitting: Internal Medicine

## 2021-06-18 ENCOUNTER — Encounter: Payer: Self-pay | Admitting: Internal Medicine

## 2021-06-18 VITALS — BP 118/68 | HR 98 | Temp 97.7°F | Resp 16 | Ht 69.0 in | Wt 212.4 lb

## 2021-06-18 DIAGNOSIS — Z202 Contact with and (suspected) exposure to infections with a predominantly sexual mode of transmission: Secondary | ICD-10-CM

## 2021-06-19 ENCOUNTER — Other Ambulatory Visit: Payer: Self-pay | Admitting: Internal Medicine

## 2021-06-19 DIAGNOSIS — Z202 Contact with and (suspected) exposure to infections with a predominantly sexual mode of transmission: Secondary | ICD-10-CM

## 2021-06-19 LAB — HSV(HERPES SIMPLEX VRS) I + II AB-IGG
HAV 1 IGG,TYPE SPECIFIC AB: 11 index — ABNORMAL HIGH
HSV 2 IGG,TYPE SPECIFIC AB: 2.22 index — ABNORMAL HIGH

## 2021-06-19 LAB — C. TRACHOMATIS/N. GONORRHOEAE RNA
C. trachomatis RNA, TMA: NOT DETECTED
N. gonorrhoeae RNA, TMA: NOT DETECTED

## 2021-06-19 LAB — HIV ANTIBODY (ROUTINE TESTING W REFLEX): HIV 1&2 Ab, 4th Generation: NONREACTIVE

## 2021-06-19 LAB — RPR: RPR Ser Ql: NONREACTIVE

## 2021-06-19 NOTE — Progress Notes (Signed)
================================================================= °-   Test results slightly outside the reference range are not unusual. If there is anything important, I will review this with you,  otherwise it is considered normal test values.  If you have further questions,  please do not hesitate to contact me at the office or via My Chart.  ================================================================= =================================================================  -  STD testing is Negative for HIV (AIDS),   Gonorrhea,   Chlamydia & Syphilis.    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

## 2021-07-07 ENCOUNTER — Other Ambulatory Visit: Payer: Self-pay | Admitting: Internal Medicine

## 2021-07-07 MED ORDER — PHENTERMINE HCL 37.5 MG PO TABS: ORAL_TABLET | ORAL | 1 refills | Status: DC

## 2021-07-09 ENCOUNTER — Ambulatory Visit: Payer: Commercial Managed Care - PPO | Admitting: Adult Health

## 2021-07-14 ENCOUNTER — Other Ambulatory Visit: Payer: Self-pay | Admitting: Internal Medicine

## 2021-07-14 MED ORDER — TADALAFIL 20 MG PO TABS: ORAL_TABLET | ORAL | 1 refills | Status: DC

## 2021-07-14 NOTE — Progress Notes (Signed)
Send results to Dr Marin Olp ?FOLLOW UP ? ?Assessment and Plan:  ? ?Hypertension ?Well controlled with current medications  ?Monitor blood pressure at home; patient to call if consistently greater than 130/80 ?Continue DASH diet.   ?Reminder to go to the ER if any CP, SOB, nausea, dizziness, severe HA, changes vision/speech, left arm numbness and tingling and jaw pain. ? ?Hyperlipidemia with Type 2 DM ?Currently at goal;  ?Continue low cholesterol diet and exercise.  ? ?Diabetes with diabetic chronic kidney disease/CKD 2 related to DM ?Continue medication:Metformin 500mg  2 tabs BID and Mounjaro 15 mg SQ QW ?Continue diet and exercise.  ?Perform daily foot/skin check, notify office of any concerning changes.  ?Push fluids, avoid NSAIDs ?Check A1C ? ?History of GI Bleed ?Continue to follow with GI ?Continue medications ?Monitor symptoms and CBC ? ?Obesity with co morbidities ?Long discussion about weight loss, diet, and exercise ?Recommended diet heavy in fruits and veggies and low in animal meats, cheeses, and dairy products, appropriate calorie intake ?Will follow up in 3 months ? ?Left Shoulder pain ?- Xray left shoulder ?If Xray does not reveal cause will refer to ortho for further evaluation ? ?Vitamin D Def ?At goal at last visit; continue supplementation to maintain goal of 60-100 ?Defer Vit D level ? ?Erythrocytosis ?- CBC ?Continue to follow with Dr. Marin Olp ? ?Testosterone deficiency ?Follows with specialist ?-Estrogen ?-Testosterone ? ?Erectile dysfunction related to Type 2 DM ?Continue Cialis as needed ?Stressed importance of managing blood sugars ? ?Continue diet and meds as discussed. Further disposition pending results of labs. Discussed med's effects and SE's.   ?Over 30 minutes of exam, counseling, chart review, and critical decision making was performed.  ? ?Future Appointments  ?Date Time Provider [place]  ?10/07/2021  2:00 PM Unk Pinto, MD [facility] None  ?11/05/2021  1:00 PM [facility]  LAB [facility] None  ?11/05/2021  1:15 PM Celso Amy, NP [facility] None  ? ? ?---------------------------------------------------------------------------------------------------------------------- ? ?HPI ?51 y.o. male  presents for 3 month follow up on hypertension, cholesterol, diabetes, weight and vitamin D deficiency.  ? ?BMI is Body mass index is 30.16 kg/m?., he has not been working on diet and exercise. He is also using Phentermine for weight loss.  ?Wt Readings from Last 3 Encounters:  ?07/16/21 204 lb 3.2 oz (92.6 kg)  ?06/18/21 212 lb 6.4 oz (96.3 kg)  ?05/09/21 207 lb (93.9 kg)  ? ?He has noted he cannot lift weight with his left shoulder, as little as 5 pounds. Pain has been present for several months. ? ? ?His blood pressure has been controlled at home, today their BP is BP: 126/84  ?BP Readings from Last 3 Encounters:  ?07/16/21 126/84  ?06/18/21 118/68  ?05/09/21 119/70  ?  ? He does not workout. He denies chest pain, shortness of breath, dizziness. ? ? He is on cholesterol medication Rosuvastatin and denies myalgias. His cholesterol is at goal. The cholesterol last visit was:   ?Lab Results  ?Component Value Date  ? CHOL 87 04/08/2020  ? HDL 34 (L) 04/08/2020  ? [place] 35 04/08/2020  ? TRIG 92 04/08/2020  ? CHOLHDL 2.6 04/08/2020  ? ? He has been working on diet and exercise for prediabetes, and denies .  Currently on Metformin 500mg  2 tab BID and Mounjaro 15 mg SQ qw. Last A1C in the office was:  ?Lab Results  ?Component Value Date  ? HGBA1C 6.6 (H) 04/08/2020  ? ?Patient is on Vitamin D supplement.   ?Lab Results  ?  Component Value Date  ? VD25OH 105 (H) 04/08/2020  ?   ?He is on Testosterone supplementation through a specialist. 200 mg IM QW.  He does wish to have his testosterone and estrogen checked with his labs today.  Has been 2 weeks since last injection. He follows with Dr. Marin Olp for erythrocytosis, last visit 05/09/21 ? ?He was hospitalized with hemorrhagic shock /GI bleed   02/24/21 ?Patient is a rather pleasant 51 year old male, with past medical history of diabetes mellitus, CKD stage II, hyperlipidemia, hypertension, testosterone deficiency on aspirin, who presented to the hospital due to black tarry stools and bloody emesis. Patient was initially evaluated and admitted to critical care unit given patient's hemorrhagic shock with 2 PRBC requirements on 02/24/2021. Patient was evaluated by gastroenterology that performed a EGD that revealed multiple ulcers towards lower esophagus area. Please see official report. Patient was started on IV Protonix and will be discharged on Protonix twice daily along with outpatient follow-up with gastroenterology in 4 weeks and a repeat EGD in 8 to 12 weeks. Patient was instructed to avoid all NSAIDs including baby aspirin. Patient did have leukocytosis that was likely chronic without any infectious etiology. Procalcitonin UA chest x-ray and blood cultures were within normal limits. Patient does have a history of splenectomy in the past. Patient's type 2 diabetes remained stable during the course of hospitalization. Patient did have AKI that was likely secondary to dehydration hemorrhagic shock. Patient's subsequent creatinine continued to improve. At the time of discharge there were no barriers to communication patient agreed to follow-up PCP in 1 week, gastroenterology in 4 weeks. ?Last visit with GI was 04/01/21- continue Pantoprazole 40 mg BID- repeat EGD ? ? ?Current Medications:  ?Current Outpatient Medications on File Prior to Visit  ?Medication Sig  ? Cholecalciferol (VITAMIN D-3) 5000 units TABS Take 5,000 Units by mouth 2 (two) times daily.  ? doxycycline (VIBRAMYCIN) 100 MG capsule Take  1 capsule Daily for Skin Infection                                                 /                        TAKE 1 CAPSULE BY MOUTH  ? finasteride (PROSCAR) 5 MG tablet TAKE 1 TABLET BY MOUTH DAILY FOR PROSTATE  ? metFORMIN (GLUCOPHAGE-XR) 500 MG 24 hr  tablet TAKE 2 TABLETS BY MOUTH TWICE DAILY WITH MEALS FOR DIABETES  ? pantoprazole (PROTONIX) 40 MG tablet Take  1 to 2  tablets  Daily  for Indigestion & Acid Reflux  ? phentermine (ADIPEX-P) 37.5 MG tablet Take 1/2 to 1 tablet  every Morning for Dieting & Weight Loss                                       /                    TAKE                    BY                 MOUTH            /         (?)  ONCE DAILY  ? rosuvastatin (CRESTOR) 40 MG tablet Take 1 tablet (40 mg total) by mouth daily.  ? tadalafil (CIALIS) 20 MG tablet Take  1/2 to 1 tablet  every 2 to 3 days  as needed for XXXX  ? testosterone cypionate (DEPOTESTOSTERONE CYPIONATE) 200 MG/ML injection INJECT 1ML INTO THE MUSCLE EVERY WEEK  ? tirzepatide (MOUNJARO) 15 MG/0.5ML Pen Inject 15 mg into the skin once a week. (Dx:  e11.29)  ? Zinc 50 MG CAPS Take by mouth daily.  ? ondansetron (ZOFRAN) 8 MG tablet Take  1 tablet  2 to 3 x /day  as needed for Nausea (Patient not taking: Reported on 06/18/2021)  ? ?No current facility-administered medications on file prior to visit.  ? ? ? ?Allergies: No Known Allergies  ? ?Medical History:  ?Past Medical History:  ?Diagnosis Date  ? Erythrocytosis due to endocrine disorders 10/25/2018  ? Hyperlipidemia   ? Hypertension   ? Hypogonadism male   ? Insulin resistance   ? Pre-diabetes   ? ?Family history- Reviewed and unchanged ?Social history- Reviewed and unchanged ? ? ?Review of Systems:  ?Review of Systems  ?Constitutional:  Negative for chills, fever and weight loss.  ?HENT:  Negative for congestion and hearing loss.   ?Eyes:  Negative for blurred vision and double vision.  ?Respiratory:  Negative for cough and shortness of breath.   ?Cardiovascular:  Negative for chest pain, palpitations, orthopnea and leg swelling.  ?Gastrointestinal:  Negative for abdominal pain, constipation, diarrhea, heartburn, nausea and vomiting.  ?Musculoskeletal:  Positive for joint pain (left shoulder pain). Negative for falls and myalgias.   ?Skin:  Negative for rash.  ?Neurological:  Negative for dizziness, tingling, tremors, loss of consciousness and headaches.  ?Psychiatric/Behavioral:  Negative for depression, memory loss and suicidal ideas.   ? ? ? ?P

## 2021-07-16 ENCOUNTER — Ambulatory Visit
Admission: RE | Admit: 2021-07-16 | Discharge: 2021-07-16 | Disposition: A | Discharge status: Home / Self Care | Payer: BC Managed Care – PPO | Source: Ambulatory Visit | Attending: Nurse Practitioner | Admitting: Nurse Practitioner

## 2021-07-16 ENCOUNTER — Other Ambulatory Visit: Payer: Self-pay

## 2021-07-16 ENCOUNTER — Ambulatory Visit (INDEPENDENT_AMBULATORY_CARE_PROVIDER_SITE_OTHER): Payer: BC Managed Care – PPO | Admitting: Nurse Practitioner

## 2021-07-16 ENCOUNTER — Encounter: Payer: Self-pay | Admitting: Nurse Practitioner

## 2021-07-16 VITALS — BP 126/84 | HR 87 | Temp 97.9°F | Wt 204.2 lb

## 2021-07-16 DIAGNOSIS — I10 Essential (primary) hypertension: Secondary | ICD-10-CM | POA: Diagnosis not present

## 2021-07-16 DIAGNOSIS — M25512 Pain in left shoulder: Secondary | ICD-10-CM

## 2021-07-16 DIAGNOSIS — D751 Secondary polycythemia: Secondary | ICD-10-CM

## 2021-07-16 DIAGNOSIS — Z79899 Other long term (current) drug therapy: Secondary | ICD-10-CM | POA: Diagnosis not present

## 2021-07-16 DIAGNOSIS — E785 Hyperlipidemia, unspecified: Secondary | ICD-10-CM

## 2021-07-16 DIAGNOSIS — E349 Endocrine disorder, unspecified: Secondary | ICD-10-CM | POA: Diagnosis not present

## 2021-07-16 DIAGNOSIS — E1169 Type 2 diabetes mellitus with other specified complication: Secondary | ICD-10-CM

## 2021-07-16 DIAGNOSIS — Z8719 Personal history of other diseases of the digestive system: Secondary | ICD-10-CM

## 2021-07-16 DIAGNOSIS — E1122 Type 2 diabetes mellitus with diabetic chronic kidney disease: Secondary | ICD-10-CM | POA: Diagnosis not present

## 2021-07-16 DIAGNOSIS — N182 Chronic kidney disease, stage 2 (mild): Secondary | ICD-10-CM

## 2021-07-16 DIAGNOSIS — N521 Erectile dysfunction due to diseases classified elsewhere: Secondary | ICD-10-CM

## 2021-07-16 NOTE — Patient Instructions (Signed)
Left shoulder xray ?[facility] ?[address] [employer] ?

## 2021-07-21 ENCOUNTER — Other Ambulatory Visit: Payer: Self-pay | Admitting: Nurse Practitioner

## 2021-07-21 DIAGNOSIS — M25512 Pain in left shoulder: Secondary | ICD-10-CM

## 2021-07-23 LAB — COMPLETE METABOLIC PANEL WITH GFR
AG Ratio: 1.2 (calc) (ref 1.0–2.5)
ALT: 13 U/L (ref 9–46)
AST: 23 U/L (ref 10–35)
Albumin: 4.2 g/dL (ref 3.6–5.1)
Alkaline phosphatase (APISO): 56 U/L (ref 35–144)
BUN: 10 mg/dL (ref 7–25)
CO2: 23 mmol/L (ref 20–32)
Calcium: 9.5 mg/dL (ref 8.6–10.3)
Chloride: 99 mmol/L (ref 98–110)
Creat: 1.16 mg/dL (ref 0.70–1.30)
Globulin: 3.4 g/dL (calc) (ref 1.9–3.7)
Glucose, Bld: 91 mg/dL (ref 65–99)
Potassium: 5.1 mmol/L (ref 3.5–5.3)
Sodium: 135 mmol/L (ref 135–146)
Total Bilirubin: 0.5 mg/dL (ref 0.2–1.2)
Total Protein: 7.6 g/dL (ref 6.1–8.1)
eGFR: 77 mL/min/{1.73_m2} (ref >=60)

## 2021-07-23 LAB — HEMOGLOBIN A1C
Hgb A1c MFr Bld: 5.9 % of total Hgb — ABNORMAL HIGH (ref <5.7)
Mean Plasma Glucose: 123 mg/dL
eAG (mmol/L): 6.8 mmol/L

## 2021-07-23 LAB — CBC WITH DIFFERENTIAL/PLATELET
Absolute Monocytes: 865 cells/uL (ref 200–950)
Basophils Absolute: 137 cells/uL (ref 0–200)
Basophils Relative: 1.5 %
Eosinophils Absolute: 428 cells/uL (ref 15–500)
Eosinophils Relative: 4.7 %
HCT: 45.2 % (ref 38.5–50.0)
Hemoglobin: 13.4 g/dL (ref 13.2–17.1)
Lymphs Abs: 1847 cells/uL (ref 850–3900)
MCH: 22.1 pg — ABNORMAL LOW (ref 27.0–33.0)
MCHC: 29.6 g/dL — ABNORMAL LOW (ref 32.0–36.0)
MCV: 74.6 fL — ABNORMAL LOW (ref 80.0–100.0)
MPV: 8.8 fL (ref 7.5–12.5)
Monocytes Relative: 9.5 %
Neutro Abs: 5824 cells/uL (ref 1500–7800)
Neutrophils Relative %: 64 %
Platelets: 841 10*3/uL — ABNORMAL HIGH (ref 140–400)
RBC: 6.06 10*6/uL — ABNORMAL HIGH (ref 4.20–5.80)
RDW: 16.2 % — ABNORMAL HIGH (ref 11.0–15.0)
Total Lymphocyte: 20.3 %
WBC: 9.1 10*3/uL (ref 3.8–10.8)

## 2021-07-23 LAB — LIPID PANEL
Cholesterol: 119 mg/dL (ref <200)
HDL: 33 mg/dL — ABNORMAL LOW (ref >=40)
LDL Cholesterol (Calc): 71 mg/dL (calc)
Non-HDL Cholesterol (Calc): 86 mg/dL (calc) (ref <130)
Total CHOL/HDL Ratio: 3.6 (calc) (ref <5.0)
Triglycerides: 70 mg/dL (ref <150)

## 2021-07-23 LAB — ESTROGENS, TOTAL: Estrogen: 238.4 pg/mL — ABNORMAL HIGH (ref 60–190)

## 2021-07-23 LAB — TSH: TSH: 1.5 mIU/L (ref 0.40–4.50)

## 2021-07-23 LAB — TESTOSTERONE: Testosterone: 762 ng/dL (ref 250–827)

## 2021-08-05 ENCOUNTER — Other Ambulatory Visit: Payer: Self-pay | Admitting: Internal Medicine

## 2021-08-05 DIAGNOSIS — L719 Rosacea, unspecified: Secondary | ICD-10-CM

## 2021-09-01 ENCOUNTER — Other Ambulatory Visit: Payer: Self-pay | Admitting: Internal Medicine

## 2021-09-01 MED ORDER — PHENTERMINE HCL 37.5 MG PO TABS: ORAL_TABLET | ORAL | 1 refills | Status: DC

## 2021-09-02 ENCOUNTER — Telehealth: Payer: Self-pay

## 2021-09-02 NOTE — Telephone Encounter (Signed)
Prior auth for Phentermine approved through 12/03/21 ?

## 2021-09-11 ENCOUNTER — Other Ambulatory Visit: Payer: Self-pay | Admitting: Internal Medicine

## 2021-09-11 MED ORDER — VALACYCLOVIR HCL 1 G PO TABS: ORAL_TABLET | ORAL | 1 refills | Status: DC

## 2021-10-06 ENCOUNTER — Other Ambulatory Visit: Payer: Self-pay | Admitting: Internal Medicine

## 2021-10-06 MED ORDER — TRULICITY 0.75 MG/0.5ML ~~LOC~~ SOAJ: SUBCUTANEOUS | 0 refills | Status: DC

## 2021-10-07 ENCOUNTER — Ambulatory Visit (INDEPENDENT_AMBULATORY_CARE_PROVIDER_SITE_OTHER): Payer: Commercial Managed Care - PPO | Admitting: Internal Medicine

## 2021-10-07 ENCOUNTER — Encounter: Payer: Self-pay | Admitting: Internal Medicine

## 2021-10-07 ENCOUNTER — Encounter: Payer: Self-pay | Admitting: Hematology & Oncology

## 2021-10-07 VITALS — BP 136/86 | HR 92 | Temp 96.9°F | Resp 16 | Ht 69.5 in | Wt 196.4 lb

## 2021-10-07 DIAGNOSIS — Z136 Encounter for screening for cardiovascular disorders: Secondary | ICD-10-CM | POA: Diagnosis not present

## 2021-10-07 DIAGNOSIS — Z0000 Encounter for general adult medical examination without abnormal findings: Secondary | ICD-10-CM | POA: Diagnosis not present

## 2021-10-07 DIAGNOSIS — E349 Endocrine disorder, unspecified: Secondary | ICD-10-CM

## 2021-10-07 DIAGNOSIS — I10 Essential (primary) hypertension: Secondary | ICD-10-CM | POA: Diagnosis not present

## 2021-10-07 DIAGNOSIS — Z111 Encounter for screening for respiratory tuberculosis: Secondary | ICD-10-CM | POA: Diagnosis not present

## 2021-10-07 DIAGNOSIS — E559 Vitamin D deficiency, unspecified: Secondary | ICD-10-CM

## 2021-10-07 DIAGNOSIS — Z8249 Family history of ischemic heart disease and other diseases of the circulatory system: Secondary | ICD-10-CM

## 2021-10-07 DIAGNOSIS — I700 Atherosclerosis of aorta: Secondary | ICD-10-CM | POA: Diagnosis not present

## 2021-10-07 DIAGNOSIS — Z79899 Other long term (current) drug therapy: Secondary | ICD-10-CM

## 2021-10-07 DIAGNOSIS — R5383 Other fatigue: Secondary | ICD-10-CM

## 2021-10-07 DIAGNOSIS — Z1211 Encounter for screening for malignant neoplasm of colon: Secondary | ICD-10-CM

## 2021-10-07 DIAGNOSIS — E1169 Type 2 diabetes mellitus with other specified complication: Secondary | ICD-10-CM

## 2021-10-07 DIAGNOSIS — Z125 Encounter for screening for malignant neoplasm of prostate: Secondary | ICD-10-CM

## 2021-10-07 DIAGNOSIS — E1122 Type 2 diabetes mellitus with diabetic chronic kidney disease: Secondary | ICD-10-CM

## 2021-10-07 MED ORDER — OZEMPIC (2 MG/DOSE) 8 MG/3ML ~~LOC~~ SOPN: PEN_INJECTOR | SUBCUTANEOUS | 3 refills | Status: DC

## 2021-10-07 NOTE — Progress Notes (Signed)
Annual  Screening/Preventative Visit  & Comprehensive Evaluation & Examination  Future Appointments  Date Time Provider Department  10/07/2021  2:00 PM Unk Pinto, MD [facility]  11/05/2021  1:00 PM [facility]  11/05/2021  1:15 PM Celso Amy, NP [facility]  10/12/2022  2:00 PM Unk Pinto, MD [facility]              This very nice 51 y.o. single overweight WM presents for a Screening /Preventative Visit & comprehensive evaluation and management of multiple medical co-morbidities.  Patient has been followed for HTN, HLD, T2_NIDDM, Testosterone Deficiency and Vitamin D Deficiency. Patient has hx/o Hereditary Spherocytosis - post splenectomy.           Patient had in the past gone to a "testosterone clinic " in [place] for testosterone pellets and he's decided after the 6 month interval to resume Depo-T injections 1 cc weekly & will call for refill in about 4 month. The NP there also gave him a Rx for Saxenda inj daily and when he completed that , he was switched to Ozempic weekly increasing to 2 mg dosing.        HTN predates since 2005 . Patient's BP has been controlled at home.  Today's BP is at goal - 136/86.   Patient denies any cardiac symptoms as chest pain, palpitations, shortness of breath, dizziness or ankle swelling.       Patient's hyperlipidemia is controlled with diet and Rosuvastatin. Patient denies myalgias or other medication SE's. Last lipids were at goal:  Lab Results  Component Value Date   CHOL 119 07/16/2021   HDL 33 (L) 07/16/2021   LDLCALC 71 07/16/2021   TRIG 70 07/16/2021   CHOLHDL 3.6 07/16/2021        Patient has hx/o Morbid Obesity and  PreDM/Insulin Resistance   (A1c 4.8% / Insulin 77 /2011 and A1c 5.0% / Insulin 133 /2014) and transitioning to T2_NIDDM (A1c 7.4% /2019) /CKD2  (GFR 77) and patient denies reactive hypoglycemic symptoms, visual blurring, diabetic polys or paresthesias.  Last BMI 30.16 in Mar 2023) .    Last A1c was  6.6% in Dec 2021 -  still not at goal :  Lab Results  Component Value Date   HGBA1C 5.9 (H) 07/16/2021                                                 Patient has Testosterone Deficiency ("185" / 2013 and "239" / 2015) treated initially with  Androgel and subsequently switched to Depo-Testosterone due to expense of the Gel.   Patient had developed polycythemia attributed to the testosterone .   He was recommended phlebotomies by Dr Marin Olp in the past, but patient never followed up.  He does have hx/o splenectomy for Hereditary Spherocytosis.              Finally, patient has history of Vitamin D Deficiency ("40" /2010) and last vitamin D was borderline elevated:   Lab Results  Component Value Date   VD25OH 105 (H) 04/08/2020       Current Outpatient Medications on File Prior to Visit  Medication Sig   ITAMIN D  5000 units TABS Take  2  times daily.   doxycycline 100 MG capsule TAKE 1 CAPSULE TWICE DAILY    finasteride 5 MG tablet Take 1 tablet  Daily     metFORMIN-XR) 500 MG  Take  2 tablets  2 x /day with Meals    OZEMPIC, 1 MG/DOSE, 4 MG/3ML SOPN INJECT 1MG  INTO THE SKIN ONCE A WEEK   rosuvastatin (CRESTOR) 40 MG tablet Take 1 tablet daily.   tadalafil (CIALIS) 20 MG tablet Take  1/2 to 1 tablet  every 2 to 3 days  as needed    testosterone cypio 200 MG/ML injection INJECT 1 ML IM EVERY WEEK - not taking    Zinc 50 MG CAPS Take  daily.    No Known Allergies   Past Medical History:  Diagnosis Date   Erythrocytosis due to endocrine disorders 10/25/2018   Hyperlipidemia    Hypertension    Hypogonadism male    Insulin resistance    Pre-diabetes      Health Maintenance  Topic Date Due   COVID-19 Vaccine (1) Never done   Pneumococcal Vaccine 71-89 Years old (1 - PCV) Never done   OPHTHALMOLOGY EXAM  Never done   Zoster Vaccines- Shingrix (1 of 2) Never done   COLONOSCOPY  Never done   FOOT EXAM  08/28/2020   HEMOGLOBIN A1C  10/07/2020   INFLUENZA VACCINE  11/25/2020    TETANUS/TDAP  05/09/2024   PNEUMOCOCCAL POLYSACCHARIDE VACCINE AGE 60-64 HIGH RISK  Completed   Hepatitis C Screening  Completed   HIV Screening  Completed   HPV VACCINES  Aged Out     Immunization History  Administered Date(s) Administered   PPD Test 06/15/2016, 07/29/2017, 08/18/2018, 08/30/2019   Pneumococcal -13 07/29/2017   Pneumococcal -23 01/08/2010, 05/09/2014   Td 10/20/2004   Tdap 05/09/2014   Zoster, Live 06/05/2015    Last Colon - never - >  Cologard ordered today.   Past Surgical History:  Procedure Laterality Date   SPLENECTOMY       Family History  Problem Relation Age of Onset   Diabetes Mother    Hypertension Mother    Heart disease Mother    Hypertension Father    Cancer Father     Social History   Socioeconomic History   Marital status: Single  Occupational History   Personnel officer  Tobacco Use   Smoking status: Never   Smokeless tobacco: Never  Vaping Use   Vaping Use: Never used  Substance and Sexual Activity   Alcohol use: Yes    Comment: occasionally   Drug use: No   Sexual activity: Not on file      ROS Constitutional: Denies fever, chills, weight loss/gain, headaches, insomnia,  night sweats or change in appetite. Does c/o fatigue. Eyes: Denies redness, blurred vision, diplopia, discharge, itchy or watery eyes.  ENT: Denies discharge, congestion, post nasal drip, epistaxis, sore throat, earache, hearing loss, dental pain, Tinnitus, Vertigo, Sinus pain or snoring.  Cardio: Denies chest pain, palpitations, irregular heartbeat, syncope, dyspnea, diaphoresis, orthopnea, PND, claudication or edema Respiratory: denies cough, dyspnea, DOE, pleurisy, hoarseness, laryngitis or wheezing.  Gastrointestinal: Denies dysphagia, heartburn, reflux, water brash, pain, cramps, nausea, vomiting, bloating, diarrhea, constipation, hematemesis, melena, hematochezia, jaundice or hemorrhoids Genitourinary: Denies dysuria, frequency,  urgency, nocturia, hesitancy, discharge, hematuria or flank pain Musculoskeletal: Denies arthralgia, myalgia, stiffness, Jt. Swelling, pain, limp or strain/sprain. Denies Falls. Skin: Denies puritis, rash, hives, warts, acne, eczema or change in skin lesion Neuro: No weakness, tremor, incoordination, spasms, paresthesia or pain Psychiatric: Denies confusion, memory loss or sensory loss. Denies Depression. Endocrine: Denies change in weight, skin, hair change, nocturia, and paresthesia,  diabetic polys, visual blurring or hyper / hypo glycemic episodes.  Heme/Lymph: No excessive bleeding, bruising or enlarged lymph nodes.   Physical Exam  BP 136/86   Pulse 92   Temp (!) 96.9 F (36.1 C) (Skin)   Resp 16   Ht 5' 9.5" (1.765 m)   Wt 196 lb 6.4 oz (89.1 kg)   SpO2 99%   BMI 28.59 kg/m   General Appearance: Over nourished and well groomed and in no apparent distress.  Eyes: PERRLA, EOMs, conjunctiva no swelling or erythema, normal fundi and vessels. Sinuses: No frontal/maxillary tenderness ENT/Mouth: EACs patent / TMs  nl. Nares clear without erythema, swelling, mucoid exudates. Oral hygiene is good. No erythema, swelling, or exudate. Tongue normal, non-obstructing. Tonsils not swollen or erythematous. Hearing normal.  Neck: Supple, thyroid not palpable. No bruits, nodes or JVD. Respiratory: Respiratory effort normal.  BS equal and clear bilateral without rales, rhonci, wheezing or stridor. Cardio: Heart sounds are normal with regular rate and rhythm and no murmurs, rubs or gallops. Peripheral pulses are normal and equal bilaterally without edema. No aortic or femoral bruits. Chest: symmetric with normal excursions and percussion.  Abdomen: Soft, with Nl bowel sounds. Nontender, no guarding, rebound, hernias, masses, or organomegaly.  Lymphatics: Non tender without lymphadenopathy.  Musculoskeletal: Full ROM all peripheral extremities, joint stability, 5/5 strength, and normal gait. Skin:  Warm and dry without rashes, lesions, cyanosis, clubbing or  ecchymosis.  Neuro: Cranial nerves intact, reflexes equal bilaterally. Normal muscle tone, no cerebellar symptoms. Sensation intact.  Pysch: Alert and oriented X 3 with normal affect, insight and judgment appropriate.   Assessment and Plan  1. Annual Preventative/Screening Exam    2. Essential hypertension  - EKG 12-Lead - [place], RETROPERITNL ABD,  LTD - Urinalysis, Routine w reflex microscopic - Microalbumin / creatinine urine ratio - CBC with Differential/Platelet - COMPLETE METABOLIC PANEL WITH GFR - Magnesium  3. Hyperlipidemia associated with type 2 diabetes mellitus ([place])  - EKG 12-Lead - [place], RETROPERITNL ABD,  LTD - TSH  4. Type 2 diabetes mellitus with stage 2 chronic kidney  disease, without long-term current use of insulin (HCC)  - EKG 12-Lead - [place], RETROPERITNL ABD,  LTD - HM DIABETES FOOT EXAM - PR LOW EXTEMITY NEUR EXAM DOCUM - PTH, intact and calcium - Insulin, random - Fructosamine  5. Vitamin D deficiency  - VITAMIN D 25 Hydroxy   6. Class 3 severe obesity due to excess calories with  serious comorbidity in adult, unspecified BMI (HCC)  - TSH  7. Testosterone deficiency   8. Screening PSA (prostate specific antigen)  - PSA  9. Screening for colorectal cancer  - Cologuard  10. Screening for heart disease  - EKG 12-Lead  11. FHx: heart disease  - EKG 12-Lead - [place], RETROPERITNL ABD,  LTD  12. Screening for AAA (aortic abdominal aneurysm)  - [place], RETROPERITNL ABD,  LTD  13. Screening-pulmonary TB  - TB Skin Test  14. Fatigue  - Iron, Total/Total Iron Binding Cap - Vitamin B12 - CBC with Differential/Platelet - TSH  15. Medication management  - Urinalysis, Routine w reflex microscopic - Microalbumin / creatinine urine ratio - Cologuard - CBC with Differential/Platelet - COMPLETE METABOLIC PANEL WITH GFR - Magnesium - TSH - Insulin, random - VITAMIN D 25 Hydroxy   - Fructosamine  Patient advised that he should one provider to be his Primary PCP or be at risk for divergent opinions in medical care.  Patient was counseled in prudent diet, weight control to achieve/maintain BMI less than 25, BP monitoring, regular exercise and medications as discussed.  Discussed med effects and SE's. Routine screening labs and tests as requested with regular follow-up as recommended. Over 40 minutes of exam, counseling, chart review and high complex critical decision making was performed   Kirtland Bouchard, MD

## 2021-10-07 NOTE — Patient Instructions (Signed)

## 2021-10-08 NOTE — Progress Notes (Signed)
<><><><><><><><><><><><><><><><><><><><><><><><><><><><><><><><><>  -   Please call & review results & send lab  letter <><><><><><><><><><><><><><><><><><><><><><><><><><><><><><><><><>  -   <><><><><><><><><><><><><><><><><><><><><><><><><><><><><><><><><> - Test results slightly outside the reference range are not unusual. If there is anything important, I will review this with you,  otherwise it is considered normal test values.  If you have further questions,  please do not hesitate to contact me at the office or via My Chart.  <><><><><><><><><><><><><><><><><><><><><><><><><><><><><><><><><> <><><><><><><><><><><><><><><><><><><><><><><><><><><><><><><><><>  -  Testosterone of 1,192  is way too high for blood level 7-8 days after a shot of 1 ml.   - This is high risk for blood clotting especially in an individual with out a spleen   &  Is high risk for Heart Attack, Stroke, Phlebitis  and Blood Clot to Lung. So . . . . . . .  <><><><><><><><><><><><><><><><><><><><><><><><><><><><><><><><><> <><><><><><><><><><><><><><><><><><><><><><><><><><><><><><><><><>  - Strongly recommend decrease dose to 0.5 ML every 7 days  & schedule a lab visit in 2-3 weeks to re-check a testosterone level  on day 6 or day 7 after a shot.  <><><><><><><><><><><><><><><><><><><><><><><><><><><><><><><><><> <><><><><><><><><><><><><><><><><><><><><><><><><><><><><><><><><>  -  Iron level is low - Recommend take an OTC  iron tablet  Daily  <><><><><><><><><><><><><><><><><><><><><><><><><><><><><><><><><>  -  CBC  shows platelet counts are  elevated & therefore high risk for blood clotting, So . .   - Recommend  start taking a                                                coated Low Dose Aspirin 81 mg  every day                                                                                                       to prevent blood  clots <><><><><><><><><><><><><><><><><><><><><><><><><><><><><><><><><> <><><><><><><><><><><><><><><><><><><><><><><><><><><><><><><><><>  -  Vitamin B12 =    317     Very Low                       (Ideal or Goal Vit B12 is between 450 - 1,100)   Low Vit B12 may be associated with Anemia , Fatigue, Impotence,   Peripheral Neuropathy, Dementia, "Brain Fog"  & Depression  - Recommend take a sub-lingual form of Vitamin B12 tablet   1,000 to 5,000 mcg tab that you dissolve under your tongue /Daily   - Can get Lavonia Dana - best price at [employer] or on [employer] <><><><><><><><><><><><><><><><><><><><><><><><><><><><><><><><><> <><><><><><><><><><><><><><><><><><><><><><><><><><><><><><><><><>  -  PSA is Normal   - Great  !  <><><><><><><><><><><><><><><><><><><><><><><><><><><><><><><><><>  -  PTH   Hormone that regulates calcium balance  & is Normal  - Great  <><><><><><><><><><><><><><><><><><><><><><><><><><><><><><><><><>  -  Vitamin D = 76   - Excellent - Please keep dose same  <><><><><><><><><><><><><><><><><><><><><><><><><><><><><><><><><> <><><><><><><><><><><><><><><><><><><><><><><><><><><><><><><><><>

## 2021-10-09 ENCOUNTER — Telehealth: Payer: Self-pay

## 2021-10-09 ENCOUNTER — Other Ambulatory Visit: Payer: Self-pay | Admitting: Internal Medicine

## 2021-10-09 DIAGNOSIS — E349 Endocrine disorder, unspecified: Secondary | ICD-10-CM

## 2021-10-09 MED ORDER — TESTOSTERONE CYPIONATE 200 MG/ML IM SOLN: INTRAMUSCULAR | 0 refills | Status: AC

## 2021-10-09 NOTE — Telephone Encounter (Signed)
The patient has been denied for [facility] by his insurance company.

## 2021-10-12 LAB — COMPLETE METABOLIC PANEL WITH GFR
AG Ratio: 1.6 (calc) (ref 1.0–2.5)
ALT: 16 U/L (ref 9–46)
AST: 18 U/L (ref 10–35)
Albumin: 4.5 g/dL (ref 3.6–5.1)
Alkaline phosphatase (APISO): 62 U/L (ref 35–144)
BUN: 10 mg/dL (ref 7–25)
CO2: 25 mmol/L (ref 20–32)
Calcium: 9.9 mg/dL (ref 8.6–10.3)
Chloride: 98 mmol/L (ref 98–110)
Creat: 1.24 mg/dL (ref 0.70–1.30)
Globulin: 2.9 g/dL (calc) (ref 1.9–3.7)
Glucose, Bld: 80 mg/dL (ref 65–99)
Potassium: 4.4 mmol/L (ref 3.5–5.3)
Sodium: 136 mmol/L (ref 135–146)
Total Bilirubin: 0.7 mg/dL (ref 0.2–1.2)
Total Protein: 7.4 g/dL (ref 6.1–8.1)
eGFR: 70 mL/min/{1.73_m2} (ref >=60)

## 2021-10-12 LAB — CBC WITH DIFFERENTIAL/PLATELET
Absolute Monocytes: 1074 cells/uL — ABNORMAL HIGH (ref 200–950)
Basophils Absolute: 95 cells/uL (ref 0–200)
Basophils Relative: 1 %
Eosinophils Absolute: 95 cells/uL (ref 15–500)
Eosinophils Relative: 1 %
HCT: 48.8 % (ref 38.5–50.0)
Hemoglobin: 16 g/dL (ref 13.2–17.1)
Lymphs Abs: 2603 cells/uL (ref 850–3900)
MCH: 25.9 pg — ABNORMAL LOW (ref 27.0–33.0)
MCHC: 32.8 g/dL (ref 32.0–36.0)
MCV: 79 fL — ABNORMAL LOW (ref 80.0–100.0)
MPV: 8.5 fL (ref 7.5–12.5)
Monocytes Relative: 11.3 %
Neutro Abs: 5634 cells/uL (ref 1500–7800)
Neutrophils Relative %: 59.3 %
Platelets: 579 10*3/uL — ABNORMAL HIGH (ref 140–400)
RBC: 6.18 10*6/uL — ABNORMAL HIGH (ref 4.20–5.80)
RDW: 20.8 % — ABNORMAL HIGH (ref 11.0–15.0)
Total Lymphocyte: 27.4 %
WBC: 9.5 10*3/uL (ref 3.8–10.8)

## 2021-10-12 LAB — TESTOSTERONE: Testosterone: 1192 ng/dL — ABNORMAL HIGH (ref 250–827)

## 2021-10-12 LAB — URINALYSIS, ROUTINE W REFLEX MICROSCOPIC
Bilirubin Urine: NEGATIVE
Glucose, UA: NEGATIVE
Hgb urine dipstick: NEGATIVE
Ketones, ur: NEGATIVE
Leukocytes,Ua: NEGATIVE
Nitrite: NEGATIVE
Protein, ur: NEGATIVE
Specific Gravity, Urine: 1.005 (ref 1.001–1.035)
pH: 6 (ref 5.0–8.0)

## 2021-10-12 LAB — IRON, TOTAL/TOTAL IRON BINDING CAP
%SAT: 17 % (calc) — ABNORMAL LOW (ref 20–48)
Iron: 71 ug/dL (ref 50–180)
TIBC: 425 mcg/dL (calc) (ref 250–425)

## 2021-10-12 LAB — MICROALBUMIN / CREATININE URINE RATIO
Creatinine, Urine: 38 mg/dL (ref 20–320)
Microalb, Ur: <0.2 mg/dL

## 2021-10-12 LAB — PTH, INTACT AND CALCIUM
Calcium: 9.9 mg/dL (ref 8.6–10.3)
PTH: 49 pg/mL (ref 16–77)

## 2021-10-12 LAB — VITAMIN D 25 HYDROXY (VIT D DEFICIENCY, FRACTURES): Vit D, 25-Hydroxy: 76 ng/mL (ref 30–100)

## 2021-10-12 LAB — FRUCTOSAMINE: Fructosamine: 252 umol/L (ref 205–285)

## 2021-10-12 LAB — TSH: TSH: 1.66 mIU/L (ref 0.40–4.50)

## 2021-10-12 LAB — MAGNESIUM: Magnesium: 1.9 mg/dL (ref 1.5–2.5)

## 2021-10-12 LAB — PSA: PSA: 0.7 ng/mL (ref <=4.00)

## 2021-10-12 LAB — INSULIN, RANDOM: Insulin: 8.7 u[IU]/mL

## 2021-10-12 LAB — VITAMIN B12: Vitamin B-12: 317 pg/mL (ref 200–1100)

## 2021-10-12 NOTE — Progress Notes (Signed)
<><><><><><><><><><><><><><><><><><><><><><><><><><><><><><><><><> <><><><><><><><><><><><><><><><><><><><><><><><><><><><><><><><><>  -   Fructosamine finally returned and = 252   and approximates                                                                       a 6 week average A1c = 6.0%                                                     which approximates an average glucose of 120 mg%   <><><><><><><><><><><><><><><><><><><><><><><><><><><><><><><><><> <><><><><><><><><><><><><><><><><><><><><><><><><><><><><><><><><>  -

## 2021-11-05 ENCOUNTER — Inpatient Hospital Stay: Payer: BC Managed Care – PPO | Admitting: Family

## 2021-11-05 ENCOUNTER — Inpatient Hospital Stay: Payer: BC Managed Care – PPO

## 2021-11-11 ENCOUNTER — Encounter: Payer: Self-pay | Admitting: Internal Medicine

## 2021-11-25 ENCOUNTER — Encounter: Payer: Self-pay | Admitting: Internal Medicine

## 2021-11-25 ENCOUNTER — Ambulatory Visit (INDEPENDENT_AMBULATORY_CARE_PROVIDER_SITE_OTHER): Payer: Commercial Managed Care - PPO | Admitting: Internal Medicine

## 2021-11-25 ENCOUNTER — Encounter: Payer: Self-pay | Admitting: Hematology & Oncology

## 2021-11-25 VITALS — BP 135/91 | HR 95 | Temp 96.8°F | Ht 69.5 in | Wt 201.2 lb

## 2021-11-25 DIAGNOSIS — N529 Male erectile dysfunction, unspecified: Secondary | ICD-10-CM

## 2021-11-25 DIAGNOSIS — Z79899 Other long term (current) drug therapy: Secondary | ICD-10-CM

## 2021-11-25 DIAGNOSIS — E1122 Type 2 diabetes mellitus with diabetic chronic kidney disease: Secondary | ICD-10-CM

## 2021-11-25 DIAGNOSIS — I10 Essential (primary) hypertension: Secondary | ICD-10-CM

## 2021-11-25 DIAGNOSIS — E349 Endocrine disorder, unspecified: Secondary | ICD-10-CM

## 2021-11-25 DIAGNOSIS — N182 Chronic kidney disease, stage 2 (mild): Secondary | ICD-10-CM

## 2021-11-25 DIAGNOSIS — E538 Deficiency of other specified B group vitamins: Secondary | ICD-10-CM

## 2021-11-25 DIAGNOSIS — E611 Iron deficiency: Secondary | ICD-10-CM

## 2021-11-25 MED ORDER — TADALAFIL 20 MG PO TABS: ORAL_TABLET | ORAL | 2 refills | Status: DC

## 2021-11-25 NOTE — Progress Notes (Signed)
Future Appointments  Date Time Provider Department  11/25/2021  4:00 PM [name], MD [facility]  01/08/2022  2:30 PM Adela Glimpse, NP [facility]  04/14/2022  2:30 PM [name], MD [facility]  10/12/2022  2:00 PM [name], MD [facility]    History of Present Illness:      This very nice 51 y.o. single overweight WM with  HTN, HLD, T2_NIDDM, Testosterone Deficiency and Vitamin D Deficiency who follows up 6 weeks ago  from CPE 10/07/2021       \     Recent Fructosamine level = 252  was normal, but patient was frustrated that since switching back to Ozempic from [place], that he is backsliding and regaining weight . Today's BMI = 29.29  is in the high overweight range - almost Morbid Obesity range. Diet discussed  & he primarily subsists on "fast foods" & "processed foods" & feels that he is unable to eat vegetables.   Wt Readings from Last 3 Encounters:  11/25/21 201 lb 3.2 oz (91.3 kg)  10/07/21 196 lb 6.4 oz (89.1 kg)  07/16/21 204 lb 3.2 oz (92.6 kg)         Testosterone of 1,192  was too high for blood level 7-8 days after a shot of 1 ml & Strongly recommend decrease dose to 0.5 ML every 7 days  & schedule a lab visit in 2-3 weeks to re-check a testosterone level  on day 6 or day 7 after a shot. Patient did decrease dose to 1/2 ml as recommended but had last shot 9 days ago , so he was asked to administer a shot today  & return in 7-8 days.   Iron level was also  low & he was recommend take an OTC  iron tablet  Daily . Also Vitamin B12 =  317  was very low & he was advised to take a sub-lingual form of Vitamin B12 tablet  (latter 2 recommendations , he has not started yet ) .   Medications  Current Outpatient Medications (Endocrine & Metabolic):    metFORMIN (GLUCOPHAGE-XR) 500 MG 24 hr tablet, TAKE 2 TABLETS BY MOUTH TWICE DAILY WITH MEALS FOR DIABETES   Semaglutide, 2 MG/DOSE, (OZEMPIC, 2 MG/DOSE,) 8 MG/3ML SOPN, Inject 2 mg into skin each week for  Diabetes   ( Dx: e11.29)   testosterone cypionate (DEPOTESTOSTERONE CYPIONATE) 200 MG/ML injection, Inject 1/2 ml (100 mg) into the Muscle  each Week  Current Outpatient Medications (Cardiovascular):    rosuvastatin (CRESTOR) 40 MG tablet, Take 1 tablet (40 mg total) by mouth daily.   tadalafil (CIALIS) 20 MG tablet, Take  1/2 to 1 tablet  every 2 to 3 days  as needed for XXXX     Current Outpatient Medications (Other):    Cholecalciferol (VITAMIN D-3) 5000 units TABS, Take 5,000 Units by mouth 2 (two) times daily.   doxycycline (VIBRAMYCIN) 100 MG capsule, TAKE 1 CAPSULE BY MOUTH DAILY FOR SKIN INFECTION   finasteride (PROSCAR) 5 MG tablet, TAKE 1 TABLET BY MOUTH DAILY FOR PROSTATE   pantoprazole (PROTONIX) 40 MG tablet, Take  1 to 2  tablets  Daily  for Indigestion & Acid Reflux   phentermine 37.5 MG tablet, Take  1 tablet  every Morning   valACYclovir (VALTREX) 1000 MG tablet, Take 1 tablet  2 x / day (  every 12 hours ) for Herpes   Zinc 50 MG CAPS, Take by mouth daily.  Problem list He has Essential hypertension; Hyperlipidemia  associated with type 2 diabetes mellitus (HCC); Rosacea; Vitamin D deficiency; Medication management; Obesity (BMI 30.0-34.9); Testosterone deficiency; H/O splenectomy; Type 2 diabetes mellitus with stage 2 chronic kidney disease, without long-term current use of insulin (HCC); Hereditary spherocytosis (HCC); Erythrocytosis due to endocrine disorders; CKD stage 2 due to type 2 diabetes mellitus (HCC); Erectile dysfunction associated with type 2 diabetes mellitus (HCC); COVID-19 (05/19/2020); and Alopecia on their problem list.   Observations/Objective:  BP (!) 135/91   Pulse 95   Temp (!) 96.8 F (36 C)   Ht 5' 9.5" (1.765 m)   Wt 201 lb 3.2 oz (91.3 kg)   SpO2 99%   BMI 29.29 kg/m   HEENT - WNL. Neck - supple.  Chest - Clear equal BS. Cor - Nl HS. RRR w/o sig MGR. PP 1(+). No edema. MS- FROM w/o deformities.  Gait Nl. Neuro -  Nl w/o focal  abnormalities.   Assessment and Plan:  1. Essential hypertension   2. Erectile dysfunction, unspecified erectile dysfunction type  - tadalafil (CIALIS) 20 MG tablet;  Take  1/2 to 1 tablet  every 2 to 3 days  as needed for XXXX   Dispense: 30 tablet; Refill: 2   3. Type 2 diabetes mellitus with stage 2 chronic  kidney disease, without long-term current use of insulin (HCC)   4. Testosterone deficiency  - Testosterone; Future  5. Iron deficiency   6. Vitamin B12 deficiency   7. Medication management     Follow Up Instructions:        I discussed the assessment and treatment plan with the patient. The patient was provided an opportunity to ask questions and all were answered. The patient agreed with the plan and demonstrated an understanding of the instructions.       The patient was advised to call back or seek an in-person evaluation if the symptoms worsen or if the condition fails to improve as anticipated.    Marinus Maw, MD

## 2021-11-26 MED ORDER — TIRZEPATIDE 15 MG/0.5ML ~~LOC~~ SOAJ: 15.0000 mg | SUBCUTANEOUS | 3 refills | Status: DC

## 2021-11-26 MED ORDER — TADALAFIL 20 MG PO TABS: ORAL_TABLET | ORAL | 2 refills | Status: DC

## 2021-11-26 NOTE — Addendum Note (Signed)
Addended by: [name] on: 11/26/2021 11:21 AM   Modules accepted: Orders

## 2021-11-26 NOTE — Addendum Note (Signed)
Addended by: [name] on: 11/26/2021 06:56 PM   Modules accepted: Orders

## 2021-11-29 LAB — COLOGUARD: COLOGUARD: NEGATIVE

## 2021-11-29 NOTE — Progress Notes (Signed)
<><><><><><><><><><><><><><><><><><><><><><><><><><><><><><><><><> <><><><><><><><><><><><><><><><><><><><><><><><><><><><><><><><><>  -   Cologard - Negative   - Recommend Repeat in 3 years   <><><><><><><><><><><><><><><><><><><><><><><><><><><><><><><><><> <><><><><><><><><><><><><><><><><><><><><><><><><><><><><><><><><>

## 2021-12-02 ENCOUNTER — Other Ambulatory Visit: Payer: Self-pay

## 2021-12-02 ENCOUNTER — Other Ambulatory Visit: Payer: Commercial Managed Care - PPO

## 2021-12-02 DIAGNOSIS — E349 Endocrine disorder, unspecified: Secondary | ICD-10-CM

## 2021-12-03 LAB — TESTOSTERONE: Testosterone: 794 ng/dL (ref 250–827)

## 2021-12-03 NOTE — Progress Notes (Signed)
<><><><><><><><><><><><><><><><><><><><><><><><><><><><><><><><><> <><><><><><><><><><><><><><><><><><><><><><><><><><><><><><><><><> -   Test results slightly outside the reference range are not unusual. If there is anything important, I will review this with you,  otherwise it is considered normal test values.  If you have further questions,  please do not hesitate to contact me at the office or via My Chart.  <><><><><><><><><><><><><><><><><><><><><><><><><><><><><><><><><> <><><><><><><><><><><><><><><><><><><><><><><><><><><><><><><><><>  -  Testosterone level of 794  is a safe & acceptable level for 1 week post injection,   So please continue dosing schedule same   <><><><><><><><><><><><><><><><><><><><><><><><><><><><><><><><><> <><><><><><><><><><><><><><><><><><><><><><><><><><><><><><><><><>

## 2022-01-08 ENCOUNTER — Ambulatory Visit: Payer: Commercial Managed Care - PPO | Admitting: Nurse Practitioner

## 2022-01-20 ENCOUNTER — Other Ambulatory Visit: Payer: Self-pay

## 2022-01-20 MED ORDER — ROSUVASTATIN CALCIUM 40 MG PO TABS: 40.0000 mg | ORAL_TABLET | Freq: Every day | ORAL | 1 refills | Status: DC

## 2022-02-12 ENCOUNTER — Other Ambulatory Visit: Payer: Self-pay | Admitting: Nurse Practitioner

## 2022-04-06 LAB — HM DIABETES EYE EXAM

## 2022-04-08 ENCOUNTER — Encounter: Payer: Self-pay | Admitting: Internal Medicine

## 2022-04-09 ENCOUNTER — Encounter: Payer: Self-pay | Admitting: Internal Medicine

## 2022-04-14 ENCOUNTER — Ambulatory Visit: Payer: Commercial Managed Care - PPO | Admitting: Internal Medicine

## 2022-05-13 ENCOUNTER — Ambulatory Visit: Payer: Commercial Managed Care - PPO | Admitting: Internal Medicine

## 2022-05-13 ENCOUNTER — Encounter: Payer: Self-pay | Admitting: Internal Medicine

## 2022-05-13 VITALS — BP 118/70 | HR 90 | Temp 97.6°F | Resp 17 | Ht 69.5 in | Wt 217.6 lb

## 2022-05-13 DIAGNOSIS — E349 Endocrine disorder, unspecified: Secondary | ICD-10-CM

## 2022-05-13 DIAGNOSIS — E559 Vitamin D deficiency, unspecified: Secondary | ICD-10-CM | POA: Diagnosis not present

## 2022-05-13 DIAGNOSIS — E1169 Type 2 diabetes mellitus with other specified complication: Secondary | ICD-10-CM | POA: Diagnosis not present

## 2022-05-13 DIAGNOSIS — N182 Chronic kidney disease, stage 2 (mild): Secondary | ICD-10-CM

## 2022-05-13 DIAGNOSIS — E1122 Type 2 diabetes mellitus with diabetic chronic kidney disease: Secondary | ICD-10-CM

## 2022-05-13 DIAGNOSIS — I152 Hypertension secondary to endocrine disorders: Secondary | ICD-10-CM

## 2022-05-13 DIAGNOSIS — Z79899 Other long term (current) drug therapy: Secondary | ICD-10-CM

## 2022-05-13 DIAGNOSIS — E669 Obesity, unspecified: Secondary | ICD-10-CM

## 2022-05-13 DIAGNOSIS — I10 Essential (primary) hypertension: Secondary | ICD-10-CM | POA: Diagnosis not present

## 2022-05-13 DIAGNOSIS — E785 Hyperlipidemia, unspecified: Secondary | ICD-10-CM

## 2022-05-13 DIAGNOSIS — E1159 Type 2 diabetes mellitus with other circulatory complications: Secondary | ICD-10-CM

## 2022-05-13 NOTE — Patient Instructions (Signed)

## 2022-05-13 NOTE — Progress Notes (Signed)
Future Appointments  Date Time Provider Department  05/13/2022  2:30 PM Unk Pinto, MD [facility]  10/12/2022              cpe  2:00 PM Unk Pinto, MD [facility]    History of Present Illness:       This very nice 52 y.o. single overweight WM with  HTN, HLD, T2_NIDDM,  Testosterone Deficiency and Vitamin D Deficiency presenting for 6 month f/u.  Patient has hx/o Hereditary Spherocytosis - post splenectomy.       Patient is treated for HTN  (2005) & BP has been controlled at home. Today's BP is at goal - 118/70 .  Patient has had no complaints of any cardiac type chest pain, palpitations, dyspnea Vertell Limber /PND, dizziness, claudication, or dependent edema.      Hyperlipidemia is controlled with diet & Rosuvastatin. Patient denies myalgias or other med SE's. Last Lipids were at goal :  Lab Results  Component Value Date   CHOL 119 07/16/2021   HDL 33 (L) 07/16/2021   LDLCALC 71 07/16/2021   TRIG 70 07/16/2021   CHOLHDL 3.6 07/16/2021        Patient has hx/o Morbid Obesity and  PreDM/Insulin Resistance   (A1c 4.8% / Insulin 77 /2011 and A1c 5.0% / Insulin 133 /2014) and transitioning to T2_NIDDM (A1c 7.4% /2019) /CKD2  (GFR 77) and has had no symptoms of reactive hypoglycemia, diabetic polys, paresthesias or visual blurring.  Last A1c was near goal :  Lab Results  Component Value Date   HGBA1C 5.9 (H) 07/16/2021   Wt Readings from Last 3 Encounters:  05/13/22 217 lb 9.6 oz (98.7 kg)  11/25/21 201 lb 3.2 oz (91.3 kg)  10/07/21 196 lb 6.4 oz (89.1 kg)           Patient has Testosterone Deficiency ("185" /2013 and "239" /2015) treated initially with  Androgel and subsequently switched to Depo-Testosterone due to expense of the Gel.   Patient had developed polycythemia attributed to the testosterone and he  was recommended phlebotomies by Dr Marin Olp in the past, but patient never followed up. He sought injection & pellet Testosterone therapy at some "Clinic", but  since has returned here and accepts a smaller dose.                                                         Further, the patient also has history of Vitamin D Deficiency ("40" /2010)  and supplements vitamin D without any suspected side-effects. Last vitamin D was at goal :  Lab Results  Component Value Date   VD25OH 65 08/30/2019     Current Outpatient Medications:     Cholecalciferol (VITAMIN D-3) 5000 units TABS, Take 5,000 Units by mouth 2 (two) times daily., Disp: , Rfl:    doxycycline (VIBRAMYCIN) 100 MG capsule, TAKE 1 CAPSULE BY MOUTH DAILY FOR SKIN INFECTION, Disp: 90 capsule, Rfl: 0   finasteride (PROSCAR) 5 MG tablet, TAKE 1 TABLET BY MOUTH DAILY FOR PROSTATE, Disp: 90 tablet,    metFORMIN-XR 500 MG , TAKE 2 TABLETS TWICE DAILY    pantoprazole (PROTONIX) 40 MG tablet, Take  1 to 2  tablets  Daily  for Indigestion & Acid Reflux, Disp: 180 tablet,    phentermine  37.5 MG tablet,  Take  1 tablet  every Morning    rosuvastatin  40 MG tablet, Take 1 tablet (40 mg total) by mouth daily., Disp: 90 tablet, Rfl: 1   tadalafil 0 MG tablet, Take  1/2 to 1 tablet  every 2 to 3 days  as needed for XXXX, Disp: 30 tablet, Rfl: 2   testosterone cypio 200 MG/ML injection, Inject 1/2 ml (100 mg) into the Muscle  each Week, Disp: 1 mL, Rfl: 0   tirzepatide (MOUNJARO) 15 MG/0.5ML Pen, Inject 15 mg into the skin once a week. for Diabetes ( Dx:  e11.29), Disp: 6 mL, Rfl: 3   valACYclovir (VALTREX) 1000 MG tablet, Take 1 tablet  2 x / day (  every 12 hours ) for Herpes, Disp: 180 tablet,    Zinc 50 MG CAPS, Take daily., Disp: , Rfl:    No Known Allergies  PMHx:   Past Medical History:  Diagnosis Date   Erythrocytosis due to endocrine disorders 10/25/2018   Hyperlipidemia    Hypertension    Hypogonadism male    Insulin resistance    Pre-diabetes     Immunization History  Administered Date(s) Administered   PPD Test 07/29/2017, 08/18/2018, 08/30/2019   Pneumococcal -13 07/29/2017    Pneumococcal -23 01/08/2010, 05/09/2014   Td 10/20/2004   Tdap 05/09/2014   Zoster 06/05/2015     Past Surgical History:  Procedure Laterality Date   SPLENECTOMY       FHx:    Reviewed / unchanged   SHx:    Reviewed / unchanged    Systems Review:  Constitutional: Denies fever, chills, wt changes, headaches, insomnia, fatigue, night sweats, change in appetite. Eyes: Denies redness, blurred vision, diplopia, discharge, itchy, watery eyes.  ENT: Denies discharge, congestion, post nasal drip, epistaxis, sore throat, earache, hearing loss, dental pain, tinnitus, vertigo, sinus pain, snoring.  CV: Denies chest pain, palpitations, irregular heartbeat, syncope, dyspnea, diaphoresis, orthopnea, PND, claudication or edema. Respiratory: denies cough, dyspnea, DOE, pleurisy, hoarseness, laryngitis, wheezing.  Gastrointestinal: Denies dysphagia, odynophagia, heartburn, reflux, water brash, abdominal pain or cramps, nausea, vomiting, bloating, diarrhea, constipation, hematemesis, melena, hematochezia  or hemorrhoids. Genitourinary: Denies dysuria, frequency, urgency, nocturia, hesitancy, discharge, hematuria or flank pain. Musculoskeletal: Denies arthralgias, myalgias, stiffness, jt. swelling, pain, limping or strain/sprain.  Skin: Denies pruritus, rash, hives, warts, acne, eczema or change in skin lesion(s). Neuro: No weakness, tremor, incoordination, spasms, paresthesia or pain. Psychiatric: Denies confusion, memory loss or sensory loss. Endo: Denies change in weight, skin or hair change.  Heme/Lymph: No excessive bleeding, bruising or enlarged lymph nodes.  Physical Exam  BP 118/70   Pulse 90   Temp 97.6 F (36.4 C)   Resp 17   Ht 5' 9.5" (1.765 m)   Wt 217 lb 9.6 oz (98.7 kg)   SpO2 98%   BMI 31.67 kg/m   Appears  well nourished, well groomed  and in no distress.  Eyes: PERRLA, EOMs, conjunctiva no swelling or erythema. Sinuses: No frontal/maxillary tenderness ENT/Mouth:  EAC's clear, TM's nl w/o erythema, bulging. Nares clear w/o erythema, swelling, exudates. Oropharynx clear without erythema or exudates. Oral hygiene is good. Tongue normal, non obstructing. Hearing intact.  Neck: Supple. Thyroid not palpable. Car 2+/2+ without bruits, nodes or JVD. Chest: Respirations nl with BS clear & equal w/o rales, rhonchi, wheezing or stridor.  Cor: Heart sounds normal w/ regular rate and rhythm without sig. murmurs, gallops, clicks or rubs. Peripheral pulses normal and equal  without edema.  Abdomen: Soft & bowel  sounds normal. Non-tender w/o guarding, rebound, hernias, masses or organomegaly.  Lymphatics: Unremarkable.  Musculoskeletal: Full ROM all peripheral extremities, joint stability, 5/5 strength and normal gait.  Skin: Warm, dry without exposed rashes, lesions or ecchymosis apparent.  Neuro: Cranial nerves intact, reflexes equal bilaterally. Sensory-motor testing grossly intact. Tendon reflexes grossly intact.  Pysch: Alert & oriented x 3.  Insight and judgement nl & appropriate. No ideations.  Assessment and Plan:   1. Obesity, diabetes, and hypertension syndrome ([place])   2. Essential hypertension   - Continue diet, exercise  - Lifestyle modifications.  - Monitor appropriate labs.   - CBC with Differential/Platelet - COMPLETE METABOLIC PANEL WITH GFR - Magnesium - TSH  3. Hyperlipidemia associated with type 2 diabetes mellitus ([place])   - Continue diet/meds, exercise,& lifestyle modifications.  - Continue monitor periodic cholesterol/liver & renal functions    - Lipid panel - TSH  4. Type 2 diabetes mellitus with stage 2 chronic kidney  disease, without long-term current use of insulin ([facility])   - Continue diet, exercise  - Lifestyle modifications.  - Monitor appropriate labs   - Hemoglobin A1c - Insulin, random  5. Vitamin D deficiency   - Continue supplementation.   - VITAMIN D 25 Hydroxy   6. Testosterone deficiency   - Continue  supplementation.   - Testosterone  - Estradiol  7. Hormone imbalance  - Testosterone - Estradiol  8. Medication management  - CBC with Differential/Platelet - COMPLETE METABOLIC PANEL WITH GFR - Magnesium - Lipid panel - TSH - Hemoglobin A1c - Insulin, random - VITAMIN D 25 Hydroxy  - Testosterone - Estradiol           Discussed  regular exercise, BP monitoring, weight control to achieve/maintain BMI less than 25 and discussed med and SE's. Recommended labs to assess and monitor clinical status with further disposition pending results of labs.  I discussed the assessment and treatment plan with the patient. The patient was provided an opportunity to ask questions and all were answered. The patient agreed with the plan and demonstrated an understanding of the instructions.  I provided over 25 minutes of exam, counseling, chart review and  complex critical decision making.   Kirtland Bouchard, MD

## 2022-05-14 LAB — CBC WITH DIFFERENTIAL/PLATELET
Absolute Monocytes: 1933 cells/uL — ABNORMAL HIGH (ref 200–950)
Basophils Absolute: 136 cells/uL (ref 0–200)
Basophils Relative: 0.9 %
Eosinophils Absolute: 317 cells/uL (ref 15–500)
Eosinophils Relative: 2.1 %
HCT: 48.2 % (ref 38.5–50.0)
Hemoglobin: 17.5 g/dL — ABNORMAL HIGH (ref 13.2–17.1)
Lymphs Abs: 3488 cells/uL (ref 850–3900)
MCH: 33.9 pg — ABNORMAL HIGH (ref 27.0–33.0)
MCHC: 36.3 g/dL — ABNORMAL HIGH (ref 32.0–36.0)
MCV: 93.4 fL (ref 80.0–100.0)
MPV: 9.2 fL (ref 7.5–12.5)
Monocytes Relative: 12.8 %
Neutro Abs: 9226 cells/uL — ABNORMAL HIGH (ref 1500–7800)
Neutrophils Relative %: 61.1 %
Platelets: 509 10*3/uL — ABNORMAL HIGH (ref 140–400)
RBC: 5.16 10*6/uL (ref 4.20–5.80)
RDW: 13.1 % (ref 11.0–15.0)
Total Lymphocyte: 23.1 %
WBC: 15.1 10*3/uL — ABNORMAL HIGH (ref 3.8–10.8)

## 2022-05-14 LAB — COMPLETE METABOLIC PANEL WITH GFR
AG Ratio: 1.6 (calc) (ref 1.0–2.5)
ALT: 23 U/L (ref 9–46)
AST: 17 U/L (ref 10–35)
Albumin: 4.4 g/dL (ref 3.6–5.1)
Alkaline phosphatase (APISO): 65 U/L (ref 35–144)
BUN: 11 mg/dL (ref 7–25)
CO2: 24 mmol/L (ref 20–32)
Calcium: 9.9 mg/dL (ref 8.6–10.3)
Chloride: 102 mmol/L (ref 98–110)
Creat: 1.29 mg/dL (ref 0.70–1.30)
Globulin: 2.8 g/dL (calc) (ref 1.9–3.7)
Glucose, Bld: 107 mg/dL — ABNORMAL HIGH (ref 65–99)
Potassium: 4.5 mmol/L (ref 3.5–5.3)
Sodium: 139 mmol/L (ref 135–146)
Total Bilirubin: 1.2 mg/dL (ref 0.2–1.2)
Total Protein: 7.2 g/dL (ref 6.1–8.1)
eGFR: 67 mL/min/{1.73_m2} (ref >=60)

## 2022-05-14 LAB — TESTOSTERONE: Testosterone: 144 ng/dL — ABNORMAL LOW (ref 250–827)

## 2022-05-14 LAB — LIPID PANEL
Cholesterol: 176 mg/dL (ref <200)
HDL: 50 mg/dL (ref >=40)
LDL Cholesterol (Calc): 98 mg/dL (calc)
Non-HDL Cholesterol (Calc): 126 mg/dL (calc) (ref <130)
Total CHOL/HDL Ratio: 3.5 (calc) (ref <5.0)
Triglycerides: 183 mg/dL — ABNORMAL HIGH (ref <150)

## 2022-05-14 LAB — MAGNESIUM: Magnesium: 1.8 mg/dL (ref 1.5–2.5)

## 2022-05-14 LAB — INSULIN, RANDOM: Insulin: 20.9 u[IU]/mL — ABNORMAL HIGH

## 2022-05-14 LAB — HEMOGLOBIN A1C
Hgb A1c MFr Bld: 6 %{Hb} — ABNORMAL HIGH (ref <5.7)
Mean Plasma Glucose: 126 mg/dL
eAG (mmol/L): 7 mmol/L

## 2022-05-14 LAB — ESTRADIOL: Estradiol: <15 pg/mL (ref <=39)

## 2022-05-14 LAB — TSH: TSH: 1.55 mIU/L (ref 0.40–4.50)

## 2022-05-14 LAB — VITAMIN D 25 HYDROXY (VIT D DEFICIENCY, FRACTURES): Vit D, 25-Hydroxy: 48 ng/mL (ref 30–100)

## 2022-05-14 NOTE — Progress Notes (Signed)
<><><><><><><><><><><><><><><><><><><><><><><><><><><><><><><><><> <><><><><><><><><><><><><><><><><><><><><><><><><><><><><><><><><> - Test results slightly outside the reference range are not unusual. If there is anything important, I will review this with you,  otherwise it is considered normal test values.  If you have further questions,  please do not hesitate to contact me at the office or via My Chart.  <><><><><><><><><><><><><><><><><><><><><><><><><><><><><><><><><> <><><><><><><><><><><><><><><><><><><><><><><><><><><><><><><><><>  -  CBC shows WBC is elevated suspicious for bacterial infection, so if develop new      Symptoms suspicious for infection, please contact me  <><><><><><><><><><><><><><><><><><><><><><><><><><><><><><><><><>  -  Total Chol = 176 -  Excellent   - Very low risk for Heart Attack  / Stroke <><><><><><><><><><><><><><><><><><><><><><><><><><><><><><><><><>  -  A1c = 6.0% -  Great - continuing to improve <><><><><><><><><><><><><><><><><><><><><><><><><><><><><><><><><>  -  Testosterone = 144-  low  - please be sure taking your Zinc 50 mg tabs daily   9 Ways to Naturally Increase Testosterone Levels  1.   Lose Weight  If you're overweight, shedding the excess pounds may increase your testosterone levels, according to research presented at the Endocrine Society's 2012 meeting. Overweight men are more likely to have low testosterone levels to begin with, so this is an important trick to increase your body's testosterone production when you need it most.  2.   High-Intensity Exercise like Peak [employer]itness   Short intense exercise has a proven positive effect on increasing testosterone levels and preventing its decline. That's unlike aerobics or prolonged moderate exercise, which have shown to have negative or no effect on testosterone levels. Having a whey protein meal after exercise can further enhance the satiety/testosterone-boosting impact (hunger  hormones cause the opposite effect on your testosterone and libido). Here's a summary of what a typical high-intensity Peak Fitness routine might look like: " Warm up for three minutes  " Exercise as hard and fast as you can for 30 seconds. You should feel like you couldn't possibly go on another few seconds  " Recover at a slow to moderate pace for 90 seconds  " Repeat the high intensity exercise and recovery 7 more times .  3.   Consume Plenty of Zinc  The mineral zinc is important for testosterone production, and supplementing your diet for as little as six weeks has been shown to cause a marked improvement in testosterone among men with low levels.1 Likewise, research has shown that restricting dietary sources of zinc leads to a significant decrease in testosterone, while zinc supplementation increases it - and even protects men from exercised-induced reductions in testosterone levels.  It's estimated that up to 45 percent of adults over the age of 60 may have lower than recommended zinc intakes; even when dietary supplements were added in, an estimated 20-25 percent of older adults still had inadequate zinc intakes, according to a [employer] and Nutrition Examination Survey.4 Your diet is the best source of zinc; along with protein-rich foods like  fish, other good dietary sources of zinc include raw milk, raw cheese, beans, and yogurt or kefir made from raw milk. It can be difficult to obtain enough dietary zinc if you're a vegetarian, and also for meat-eaters as well, largely because of conventional farming methods that rely heavily on chemical fertilizers and pesticides. These chemicals deplete the soil of nutrients ... nutrients like zinc that must be absorbed by plants in order to be passed on to you. In many cases, you may further deplete the nutrients in your food by the way you prepare it. For most food, cooking it will drastically reduce its levels of nutrients  like zinc ...  particularly over-cooking, which many people do. If you decide to use a zinc supplement, stick to a dosage of 50 mg a day, as this is the recommended adult dose. Taking too much zinc can interfere with your body's ability to absorb other minerals, especially copper, and may cause nausea as a side effect.  4.   Strength Training  In addition to [employer] Fitness, strength training is also known to boost testosterone levels, provided you are doing so intensely enough. When strength training to boost testosterone, you'll want to increase the weight and lower your number of reps, and then focus on exercises that work a large number of muscles, such as dead lifts or squats.  You can "turbo-charge" your weight training by going slower. By slowing down your movement, you're actually turning it into a high-intensity exercise. Super Slow movement allows your muscle, at the microscopic level, to access the maximum number of cross-bridges between the protein filaments that produce movement in the muscle.   5.   Optimize Your Vitamin D Levels  Vitamin D, a steroid hormone, is essential for the healthy development of the nucleus of the sperm cell, and helps maintain semen quality and sperm count. Vitamin D also increases levels of testosterone, which may boost libido. In one study, overweight men who were given vitamin D supplements had a significant increase in testosterone levels after one year.5   6.   Reduce Stress  When you're under a lot of stress, your body releases high levels of the stress hormone cortisol. This hormone actually blocks the effects of testosterone,6 presumably because, from a biological standpoint, testosterone-associated behaviors (mating, competing, aggression) may have lowered your chances of survival in an emergency (hence, the "fight or flight" response is dominant, courtesy of cortisol).  7.   Limit or Eliminate Sugar from Your Diet  Testosterone levels decrease after you eat sugar,  which is likely because the sugar leads to a high insulin level, another factor leading to low testosterone.7 Based on USDA estimates, the average American consumes 12 teaspoons of sugar a day, which equates to about TWO TONS of sugar during a lifetime.  8.   Eat Healthy Fats  By healthy, this means not only mono- and polyunsaturated fats, like that found in avocadoes and nuts, but also saturated, as these are essential for building testosterone. Research shows that a diet with less than 40 percent of energy as fat (and that mainly from animal sources, i.e. saturated) lead to a decrease in testosterone levels. ie eat less animal products - as Meat , poultry and dairy. Experts agree that the ideal diet includes somewhere between 50-70 percent fat.  It's important to understand that your body requires saturated fats from animal and vegetable sources (such as meat, dairy, certain oils, and tropical plants like coconut) for optimal functioning, and if you neglect this important food group in favor of sugar, grains and other starchy carbs, your health and weight are almost guaranteed to suffer. Examples of healthy fats you can eat more of to give your testosterone levels a boost include: Olives and Olive oil  Coconuts and coconut oil Butter made from raw grass-fed organic milk Raw nuts, such as, almonds or pecans Organic pastured egg yolks Avocados Grass-fed meats Palm oil Unheated organic nut oils  9.   Boost Your Intake of Branch Chain Amino Acids (BCAA) from Foods Like [place] suggests that BCAAs result in higher testosterone levels, particularly when taken along with resistance training.  While BCAAs are available in supplement form, you'll find the highest concentrations of BCAAs like leucine in whey protein. Even when getting leucine from your natural food supply, it's often wasted or used as a building block instead of an anabolic agent. So to create the correct anabolic environment, you  need to boost leucine consumption way beyond mere maintenance levels. That said, keep in mind that using leucine as a free form amino acid can be highly counterproductive as when free form amino acids are artificially administrated, they rapidly enter your circulation while disrupting insulin function, and impairing your body's glycemic control. Food-based leucine is really the ideal form that can benefit your muscles without side effects.    <><><><><><><><><><><><><><><><><><><><><><><><><><><><><><><><><>  -   Magnesium  = 1.8  is  very  low  - goal is betw 2.0 - 2.5,   - So..............Marland Kitchen  Recommend that you take                                                       Magnesium 500 mg tablet  2 - 3 x /day with meals   - also important to eat lots of  leafy green vegetables   - spinach - Kale - collards - greens - okra - asparagus                                                                          - broccoli - quinoa - squash - almonds   - black, red, white beans -  peas - green beans <><><><><><><><><><><><><><><><><><><><><><><><><><><><><><><><><>  -  Thyroid - Normal  <><><><><><><><><><><><><><><><><><><><><><><><><><><><><><><><><>  -  Vitamin D = 48 - slightly low   - Vitamin D goal is between 70-100.   - Please make sure that you are taking your Vitamin D 10,000 units / day as directed.   - It is very important as a natural anti-inflammatory and helping the  immune system protect against viral infections, like the Covid-19    helping hair, skin, and nails, as well as reducing stroke and heart attack risk.   - It helps your bones and helps with mood.  - It also decreases numerous cancer risks,  so please take it as directed.   - Low Vit D is associated with a 200-300% higher risk for CANCER   and 200-300% higher risk for HEART   ATTACK  &  STROKE.    - It is also associated with higher death rate at younger ages,   autoimmune diseases like Rheumatoid  arthritis, Lupus,  Multiple Sclerosis.     - Also many other serious conditions, like depression, Alzheimer's  Dementia, infertility, muscle aches, fatigue, fibromyalgia   - just to name a few. <><><><><><><><><><><><><><><><><><><><><><><><><><><><><><><><><>  - Estradiol  is appropriately low  <><><><><><><><><><><><><><><><><><><><><><><><><><><><><><><><><> <><><><><><><><><><><><><><><><><><><><><><><><><><><><><><><><><>

## 2022-06-30 ENCOUNTER — Other Ambulatory Visit: Payer: Self-pay

## 2022-06-30 DIAGNOSIS — E785 Hyperlipidemia, unspecified: Secondary | ICD-10-CM

## 2022-06-30 MED ORDER — ROSUVASTATIN CALCIUM 40 MG PO TABS: 40.0000 mg | ORAL_TABLET | Freq: Every day | ORAL | 1 refills | Status: DC

## 2022-07-01 ENCOUNTER — Other Ambulatory Visit: Payer: Self-pay | Admitting: Nurse Practitioner

## 2022-07-01 ENCOUNTER — Other Ambulatory Visit: Payer: Self-pay | Admitting: Internal Medicine

## 2022-07-01 DIAGNOSIS — L719 Rosacea, unspecified: Secondary | ICD-10-CM

## 2022-07-27 ENCOUNTER — Other Ambulatory Visit: Payer: Self-pay | Admitting: Nurse Practitioner

## 2022-10-11 NOTE — Progress Notes (Incomplete)
Annual  Screening/Preventative Visit  & Comprehensive Evaluation & Examination  Future Appointments  Date Time Provider Department  10/07/2021  2:00 PM [name], MD [facility]  11/05/2021  1:00 PM [facility]  11/05/2021  1:15 PM Erenest Blank, NP [facility]  10/12/2022  2:00 PM [name], MD [facility]              This very nice 52 y.o. single overweight WM presents for a Screening /Preventative Visit & comprehensive evaluation and management of multiple medical co-morbidities.  Patient has been followed for HTN, HLD, T2_NIDDM, Testosterone Deficiency and Vitamin D Deficiency. Patient has hx/o Hereditary Spherocytosis - post splenectomy.           Patient had in the past gone to a "testosterone clinic " in [place] for testosterone pellets and he's decided after the 6 month interval to resume Depo-T injections 1 cc weekly & will call for refill in about 4 month. The NP there also gave him a Rx for Saxenda inj daily and when he completed that , he was switched to Ozempic weekly increasing to 2 mg dosing.        HTN predates since 2005 . Patient's BP has been controlled at home.  Today's BP is at goal - 136/86.   Patient denies any cardiac symptoms as chest pain, palpitations, shortness of breath, dizziness or ankle swelling.       Patient's hyperlipidemia is controlled with diet and Rosuvastatin. Patient denies myalgias or other medication SE's. Last lipids were at goal:  Lab Results  Component Value Date   CHOL 176 05/13/2022   HDL 50 05/13/2022   LDLCALC 98 05/13/2022   TRIG 183 (H) 05/13/2022   CHOLHDL 3.5 05/13/2022        Patient has hx/o Morbid Obesity and  PreDM/Insulin Resistance   (A1c 4.8% / Insulin 77 /2011 and A1c 5.0% / Insulin 133 /2014) and transitioning to T2_NIDDM (A1c 7.4% /2019) /CKD2  (GFR 77) and patient denies reactive hypoglycemic symptoms, visual blurring, diabetic polys or paresthesias.  Last BMI 30.16 in Mar 2023) .    Last A1c was  6.6% in Dec 2021 -  still not at goal :  Lab Results  Component Value Date   HGBA1C 6.0 (H) 05/13/2022                                                 Patient has Testosterone Deficiency ("185" / 2013 and "239" / 2015) treated initially with  Androgel and subsequently switched to Depo-Testosterone due to expense of the Gel.   Patient had developed polycythemia attributed to the testosterone .   He was recommended phlebotomies by Dr Myna Hidalgo in the past, but patient never followed up.  He does have hx/o splenectomy for Hereditary Spherocytosis.              Finally, patient has history of Vitamin D Deficiency ("40" /2010) and last vitamin D was borderline elevated:   Lab Results  Component Value Date   VD25OH 48 05/13/2022       Current Outpatient Medications on File Prior to Visit  Medication Sig  . ITAMIN D  5000 units TABS Take  2  times daily.  Marland Kitchen doxycycline 100 MG capsule TAKE 1 CAPSULE TWICE DAILY   . finasteride 5 MG tablet Take 1 tablet  Daily    .  metFORMIN-XR) 500 MG  Take  2 tablets  2 x /day with Meals   . OZEMPIC, 1 MG/DOSE, 4 MG/3ML SOPN INJECT 1MG  INTO THE SKIN ONCE A WEEK  . rosuvastatin (CRESTOR) 40 MG tablet Take 1 tablet daily.  . tadalafil (CIALIS) 20 MG tablet Take  1/2 to 1 tablet  every 2 to 3 days  as needed   . testosterone cypio 200 MG/ML injection INJECT 1 ML IM EVERY WEEK - not taking   . Zinc 50 MG CAPS Take  daily.    No Known Allergies   Past Medical History:  Diagnosis Date  . 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 Hyperlipidemia   . Hypertension   . Hypogonadism male   . Insulin resistance   . Pre-diabetes      Health Maintenance  Topic Date Due  . COVID-19 Vaccine (1) Never done  . Pneumococcal Vaccine 85-36 Years old (1 - PCV) Never done  . OPHTHALMOLOGY EXAM  Never done  . Zoster Vaccines- Shingrix (1 of 2) Never done  . COLONOSCOPY  Never done  . FOOT EXAM  08/28/2020  . HEMOGLOBIN A1C  10/07/2020  . INFLUENZA  VACCINE  11/25/2020  . TETANUS/TDAP  05/09/2024  . PNEUMOCOCCAL POLYSACCHARIDE VACCINE AGE 45-64 HIGH RISK  Completed  . Hepatitis C Screening  Completed  . HIV Screening  Completed  . HPV VACCINES  Aged Out     Immunization History  Administered Date(s) Administered  . PPD Test 06/15/2016, 07/29/2017, 08/18/2018, 08/30/2019  . Pneumococcal -13 07/29/2017  . Pneumococcal -23 01/08/2010, 05/09/2014  . Td 10/20/2004  . Tdap 05/09/2014  . Zoster, Live 06/05/2015    Last Colon - never - >  Cologard ordered today.   Past Surgical History:  Procedure Laterality Date  . SPLENECTOMY       Family History  Problem Relation Age of Onset  . Diabetes Mother   . Hypertension Mother   . Heart disease Mother   . Hypertension Father   . Cancer Father     Social History   Socioeconomic History  . Marital status: Single  Occupational History  . Tax adviser  Tobacco Use  . Smoking status: Never  . Smokeless tobacco: Never  Vaping Use  . Vaping Use: Never used  Substance and Sexual Activity  . Alcohol use: Yes    Comment: occasionally  . Drug use: No  . Sexual activity: Not on file      ROS Constitutional: Denies fever, chills, weight loss/gain, headaches, insomnia,  night sweats or change in appetite. Does c/o fatigue. Eyes: Denies redness, blurred vision, diplopia, discharge, itchy or watery eyes.  ENT: Denies discharge, congestion, post nasal drip, epistaxis, sore throat, earache, hearing loss, dental pain, Tinnitus, Vertigo, Sinus pain or snoring.  Cardio: Denies chest pain, palpitations, irregular heartbeat, syncope, dyspnea, diaphoresis, orthopnea, PND, claudication or edema Respiratory: denies cough, dyspnea, DOE, pleurisy, hoarseness, laryngitis or wheezing.  Gastrointestinal: Denies dysphagia, heartburn, reflux, water brash, pain, cramps, nausea, vomiting, bloating, diarrhea, constipation, hematemesis, melena, hematochezia, jaundice or  hemorrhoids Genitourinary: Denies dysuria, frequency, urgency, nocturia, hesitancy, discharge, hematuria or flank pain Musculoskeletal: Denies arthralgia, myalgia, stiffness, Jt. Swelling, pain, limp or strain/sprain. Denies Falls. Skin: Denies puritis, rash, hives, warts, acne, eczema or change in skin lesion Neuro: No weakness, tremor, incoordination, spasms, paresthesia or pain Psychiatric: Denies confusion, memory loss or sensory loss. Denies Depression. Endocrine: Denies change in weight, skin, hair change, nocturia, and paresthesia, diabetic polys, visual blurring or  hyper / hypo glycemic episodes.  Heme/Lymph: No excessive bleeding, bruising or enlarged lymph nodes.   Physical Exam  There were no vitals taken for this visit.  General Appearance: Over nourished and well groomed and in no apparent distress.  Eyes: PERRLA, EOMs, conjunctiva no swelling or erythema, normal fundi and vessels. Sinuses: No frontal/maxillary tenderness ENT/Mouth: EACs patent / TMs  nl. Nares clear without erythema, swelling, mucoid exudates. Oral hygiene is good. No erythema, swelling, or exudate. Tongue normal, non-obstructing. Tonsils not swollen or erythematous. Hearing normal.  Neck: Supple, thyroid not palpable. No bruits, nodes or JVD. Respiratory: Respiratory effort normal.  BS equal and clear bilateral without rales, rhonci, wheezing or stridor. Cardio: Heart sounds are normal with regular rate and rhythm and no murmurs, rubs or gallops. Peripheral pulses are normal and equal bilaterally without edema. No aortic or femoral bruits. Chest: symmetric with normal excursions and percussion.  Abdomen: Soft, with Nl bowel sounds. Nontender, no guarding, rebound, hernias, masses, or organomegaly.  Lymphatics: Non tender without lymphadenopathy.  Musculoskeletal: Full ROM all peripheral extremities, joint stability, 5/5 strength, and normal gait. Skin: Warm and dry without rashes, lesions, cyanosis, clubbing  or  ecchymosis.  Neuro: Cranial nerves intact, reflexes equal bilaterally. Normal muscle tone, no cerebellar symptoms. Sensation intact.  Pysch: Alert and oriented X 3 with normal affect, insight and judgment appropriate.   Assessment and Plan  1. Annual Preventative/Screening Exam    2. Essential hypertension  - EKG 12-Lead - [place], RETROPERITNL ABD,  LTD - Urinalysis, Routine w reflex microscopic - Microalbumin / creatinine urine ratio - CBC with Differential/Platelet - COMPLETE METABOLIC PANEL WITH GFR - Magnesium  3. Hyperlipidemia associated with type 2 diabetes mellitus (HCC)  - EKG 12-Lead - [place], RETROPERITNL ABD,  LTD - TSH  4. Type 2 diabetes mellitus with stage 2 chronic kidney  disease, without long-term current use of insulin (HCC)  - EKG 12-Lead - [place], RETROPERITNL ABD,  LTD - HM DIABETES FOOT EXAM - PR LOW EXTEMITY NEUR EXAM DOCUM - PTH, intact and calcium - Insulin, random - Fructosamine  5. Vitamin D deficiency  - VITAMIN D 25 Hydroxy   6. Class 3 severe obesity due to excess calories with  serious comorbidity in adult, unspecified BMI (HCC)  - TSH  7. Testosterone deficiency   8. Screening PSA (prostate specific antigen)  - PSA  9. Screening for colorectal cancer  - Cologuard  10. Screening for heart disease  - EKG 12-Lead  11. FHx: heart disease  - EKG 12-Lead - [place], RETROPERITNL ABD,  LTD  12. Screening for AAA (aortic abdominal aneurysm)  - [place], RETROPERITNL ABD,  LTD  13. Screening-pulmonary TB  - TB Skin Test  14. Fatigue  - Iron, Total/Total Iron Binding Cap - Vitamin B12 - CBC with Differential/Platelet - TSH  15. Medication management  - Urinalysis, Routine w reflex microscopic - Microalbumin / creatinine urine ratio - Cologuard - CBC with Differential/Platelet - COMPLETE METABOLIC PANEL WITH GFR - Magnesium - TSH - Insulin, random - VITAMIN D 25 Hydroxy  - Fructosamine  Patient advised that he should one  provider to be his Primary PCP or be at risk for divergent opinions in medical care.          Patient was counseled in prudent diet, weight control to achieve/maintain BMI less than 25, BP monitoring, regular exercise and medications as discussed.  Discussed med effects and SE's. Routine screening labs and tests as requested with regular follow-up as recommended.  Over 40 minutes of exam, counseling, chart review and high complex critical decision making was performed   Marinus Maw, MD

## 2022-10-11 NOTE — Progress Notes (Signed)
Annual  Screening/Preventative Visit  & Comprehensive Evaluation & Examination   Future Appointments  Date Time Provider Department  10/12/2022                       cpe  2:00 PM [name], MD [facility]  10/20/2023                      cpe  2:00 PM [name], MD [facility]            This very nice 52 y.o. single overweight WM  with  HTN, HLD, T2_NIDDM, Testosterone Deficiency and Vitamin D Deficiency  presents for a Screening /Preventative Visit & comprehensive evaluation and management of multiple medical co-morbidities.    Patient has hx/o Hereditary Spherocytosis - post splenectomy.           Patient had in the past gone to a "testosterone clinic " in [place] for testosterone pellets and he's decided after the 6 month interval to resume Depo-T injections 1 cc weekly & will call for refill in about 4 month. The NP there also gave him a Rx for Saxenda inj daily and when he completed that , he was switched to Ozempic weekly increasing to 2 mg dosing.        HTN predates since 2005 . Patient's BP has been controlled at home.  Today's BP is at goal - 116/80  .   Patient denies any cardiac symptoms as chest pain, palpitations, shortness of breath, dizziness or ankle swelling.       Patient's hyperlipidemia is controlled with diet and Rosuvastatin. Patient denies myalgias or other medication SE's. Last lipids were at goal:  Lab Results  Component Value Date   CHOL 176 05/13/2022   HDL 50 05/13/2022   LDLCALC 98 05/13/2022   TRIG 183 (H) 05/13/2022   CHOLHDL 3.5 05/13/2022        Patient has hx/o Morbid Obesity (BMI 31+)  and  PreDM/Insulin Resistance   (A1c 4.8% / Insulin 77 /2011 and A1c 5.0% / Insulin 133 /2014) and transitioning to T2_NIDDM (A1c 7.4% /2019) /CKD2  (GFR 77) and patient denies reactive hypoglycemic symptoms, visual blurring, diabetic polys or paresthesias.   Patient is on Metformin and was initially on Semaglutide and now [facility] over the last  year. Last BMI 30.16 in Mar 2023) .    Last A1c was 6.6% in Dec 2021 -  still not at goal :  Lab Results  Component Value Date   HGBA1C 6.0 (H) 05/13/2022   Wt Readings from Last 3 Encounters:  10/12/22 225 lb (102.1 kg)  05/13/22 217 lb 9.6 oz (98.7 kg)  11/25/21 201 lb 3.2 oz (91.3 kg)                                                 Patient has Testosterone Deficiency ("185" / 2013 and "239" / 2015) treated initially with  Androgel and subsequently switched to Depo-Testosterone due to expense of the Gel.   Patient had developed polycythemia attributed to the testosterone .   He was recommended phlebotomies by Dr Myna Hidalgo in the past, but patient never followed up.  He does have hx/o splenectomy for Hereditary Spherocytosis.               Finally, patient has  history of Vitamin D Deficiency ( "40" /2010 ) and last vitamin D was borderline elevated:   Lab Results  Component Value Date   VD25OH 48 05/13/2022       Current Outpatient Medications  Medication Instructions   doxycycline 100 MG capsule TAKE 1 CAPSULE  DAILY    finasteride 5 MG tablet TAKE 1 TABLET  DAILY    metFORMIN  - XR  500 MG  TAKE 2 TABLETS  TWICE DAILY    pantoprazole  40 MG tablet Take  1 to 2  tablets  Daily    phentermine 37.5 MG tablet Take  1 tablet  every Morning    rosuvastatin  40 mg Daily   tadalafil 20 MG tablet Take  1/2 to 1 tablet  every 2 to 3 days  as needed   testosterone cypio  200 MG/ML inj Inject 1/2 ml (100 mg) into the Muscle  each Week   tirzepatide (MOUNJARO) 15 mg Subcutaneous, Weekly   Vitamin D  5,000 Units 2 times daily   Zinc 50 MG CAPS Daily    No Known Allergies    Past Medical History:  Diagnosis Date   Erythrocytosis due to endocrine disorders 10/25/2018   Hyperlipidemia    Hypertension    Hypogonadism male    Insulin resistance    Pre-diabetes      Health Maintenance  Topic Date Due   COVID-19 Vaccine (1) Never done   Pneumococcal Vaccine 38-45 Years old (1 -  PCV) Never done   OPHTHALMOLOGY EXAM  Never done   Zoster Vaccines- Shingrix (1 of 2) Never done   COLONOSCOPY  Never done   FOOT EXAM  08/28/2020   HEMOGLOBIN A1C  10/07/2020   INFLUENZA VACCINE  11/25/2020   TETANUS/TDAP  05/09/2024   PNEUMOCOCCAL POLYSACCHARIDE VACCINE AGE 78-64 HIGH RISK  Completed   Hepatitis C Screening  Completed   HIV Screening  Completed   HPV VACCINES  Aged Out     Immunization History  Administered Date(s) Administered   PPD Test 06/15/2016, 07/29/2017, 08/18/2018, 08/30/2019   Pneumococcal -13 07/29/2017   Pneumococcal -23 01/08/2010, 05/09/2014   Td 10/20/2004   Tdap 05/09/2014   Zoster, Live 06/05/2015    11/22/2021  - Cologard - > Negative - Recc F/U 3 years in Aug 2026    Past Surgical History:  Procedure Laterality Date   SPLENECTOMY       Family History  Problem Relation Age of Onset   Diabetes Mother    Hypertension Mother    Heart disease Mother    Hypertension Father    Cancer Father     Social History   Socioeconomic History   Marital status: Single  Occupational History   Tax adviser  Tobacco Use   Smoking status: Never   Smokeless tobacco: Never  Vaping Use   Vaping Use: Never used  Substance and Sexual Activity   Alcohol use: Yes    Comment: occasionally   Drug use: No   Sexual activity: Not on file      ROS  Constitutional: Denies fever, chills, weight loss/gain, headaches, insomnia,  night sweats or change in appetite. Does c/o fatigue. Eyes: Denies redness, blurred vision, diplopia, discharge, itchy or watery eyes.  ENT: Denies discharge, congestion, post nasal drip, epistaxis, sore throat, earache, hearing loss, dental pain, Tinnitus, Vertigo, Sinus pain or snoring.  Cardio: Denies chest pain, palpitations, irregular heartbeat, syncope, dyspnea, diaphoresis, orthopnea, PND, claudication or edema Respiratory: denies cough,  dyspnea, DOE, pleurisy, hoarseness, laryngitis or wheezing.   Gastrointestinal: Denies dysphagia, heartburn, reflux, water brash, pain, cramps, nausea, vomiting, bloating, diarrhea, constipation, hematemesis, melena, hematochezia, jaundice or hemorrhoids Genitourinary: Denies dysuria, frequency, urgency, nocturia, hesitancy, discharge, hematuria or flank pain Musculoskeletal: Denies arthralgia, myalgia, stiffness, Jt. Swelling, pain, limp or strain/sprain. Denies Falls. Skin: Denies puritis, rash, hives, warts, acne, eczema or change in skin lesion Neuro: No weakness, tremor, incoordination, spasms, paresthesia or pain Psychiatric: Denies confusion, memory loss or sensory loss. Denies Depression. Endocrine: Denies change in weight, skin, hair change, nocturia, and paresthesia, diabetic polys, visual blurring or hyper / hypo glycemic episodes.  Heme/Lymph: No excessive bleeding, bruising or enlarged lymph nodes.   Physical Exam  BP 116/80   Pulse 100   Temp 97.8 F (36.6 C)   Resp 16   Ht 5' 9.5" (1.765 m)   Wt 225 lb (102.1 kg)   SpO2 98%   BMI 32.75 kg/m   General Appearance: Over nourished and well groomed and in no apparent distress.  Eyes: PERRLA, EOMs, conjunctiva no swelling or erythema, normal fundi and vessels. Sinuses: No frontal/maxillary tenderness ENT/Mouth: EACs patent / TMs  nl. Nares clear without erythema, swelling, mucoid exudates. Oral hygiene is good. No erythema, swelling, or exudate. Tongue normal, non-obstructing. Tonsils not swollen or erythematous. Hearing normal.  Neck: Supple, thyroid not palpable. No bruits, nodes or JVD. Respiratory: Respiratory effort normal.  BS equal and clear bilateral without rales, rhonci, wheezing or stridor. Cardio: Heart sounds are normal with regular rate and rhythm and no murmurs, rubs or gallops. Peripheral pulses are normal and equal bilaterally without edema. No aortic or femoral bruits. Chest: symmetric with normal excursions and percussion.  Abdomen: Soft, with Nl bowel sounds.  Nontender, no guarding, rebound, hernias, masses, or organomegaly.  Lymphatics: Non tender without lymphadenopathy.  Musculoskeletal: Full ROM all peripheral extremities, joint stability, 5/5 strength, and normal gait. Skin: Warm and dry without rashes, lesions, cyanosis, clubbing or  ecchymosis.  Neuro: Cranial nerves intact, reflexes equal bilaterally. Normal muscle tone, no cerebellar symptoms. Sensation intact.  Pysch: Alert and oriented X 3 with normal affect, insight and judgment appropriate.   Assessment and Plan  1. Annual Preventative/Screening Exam    2. Class 3 severe obesity due to excess calories with serious comorbidity in adult, (HCC)  - TSH  3. Essential hypertension  - EKG 12-Lead - [place], RETROPERITNL ABD,  LTD - Urinalysis, Routine w reflex microscopic - Microalbumin / creatinine urine ratio - CBC with Differential/Platelet - COMPLETE METABOLIC PANEL WITH GFR - Magnesium - TSH  4. Hyperlipidemia associated with type 2 diabetes mellitus (HCC)  - EKG 12-Lead - [place], RETROPERITNL ABD,  LTD - Lipid panel - TSH  5. Type 2 diabetes mellitus with stage 2 chronic kidney                          disease, without long-term current use of insulin (HCC)  - EKG 12-Lead - [place], RETROPERITNL ABD,  LTD - Urinalysis, Routine w reflex microscopic - Microalbumin / creatinine urine ratio - HM DIABETES FOOT EXAM - PR LOW EXTEMITY NEUR EXAM DOCUM - Hemoglobin A1c - Insulin, random  6. Vitamin D deficiency  - VITAMIN D 25 Hydroxy   7. Testosterone deficiency  - Testosterone  8. Vitamin B12 deficiency  - Vitamin B12  9. Iron deficiency  - Iron, Total/Total Iron Binding Cap  10. Screening-pulmonary TB  - TB Skin Test  11. Screening for  colorectal cancer  - POC Hemoccult Bld/Stl   12. Screening for heart disease  - EKG 12-Lead  13. FHx: heart disease  - EKG 12-Lead - [place], RETROPERITNL ABD,  LTD  14. Screening for AAA (aortic abdominal aneurysm)  - [place],  RETROPERITNL ABD,  LTD  15. Fatigue  - Iron, Total/Total Iron Binding Cap - Vitamin B12 - CBC with Differential/Platelet - TSH  16. Medication management  - Urinalysis, Routine w reflex microscopic - Microalbumin / creatinine urine ratio - Testosterone - CBC with Differential/Platelet - COMPLETE METABOLIC PANEL WITH GFR - Magnesium - Lipid panel - TSH - Hemoglobin A1c - Insulin, random - VITAMIN D 25 Hydroxy   17. Screening PSA (prostate specific antigen)  - PSA    Patient advised that he should one provider to be his Primary PCP or be at risk for divergent opinions in medical care.          Patient was counseled in prudent diet, weight control to achieve/maintain BMI less than 25, BP monitoring, regular exercise and medications as discussed.  Discussed med effects and SE's. Routine screening labs and tests as requested with regular follow-up as recommended. Over 40 minutes of exam, counseling, chart review and high complex critical decision making was performed   Marinus Maw, MD

## 2022-10-12 ENCOUNTER — Encounter: Payer: Self-pay | Admitting: Internal Medicine

## 2022-10-12 ENCOUNTER — Ambulatory Visit (INDEPENDENT_AMBULATORY_CARE_PROVIDER_SITE_OTHER): Payer: Commercial Managed Care - PPO | Admitting: Internal Medicine

## 2022-10-12 VITALS — BP 116/80 | HR 100 | Temp 97.8°F | Resp 16 | Ht 69.5 in | Wt 225.0 lb

## 2022-10-12 DIAGNOSIS — Z111 Encounter for screening for respiratory tuberculosis: Secondary | ICD-10-CM | POA: Diagnosis not present

## 2022-10-12 DIAGNOSIS — I700 Atherosclerosis of aorta: Secondary | ICD-10-CM

## 2022-10-12 DIAGNOSIS — E611 Iron deficiency: Secondary | ICD-10-CM

## 2022-10-12 DIAGNOSIS — E785 Hyperlipidemia, unspecified: Secondary | ICD-10-CM

## 2022-10-12 DIAGNOSIS — Z79899 Other long term (current) drug therapy: Secondary | ICD-10-CM

## 2022-10-12 DIAGNOSIS — Z0001 Encounter for general adult medical examination with abnormal findings: Secondary | ICD-10-CM

## 2022-10-12 DIAGNOSIS — Z125 Encounter for screening for malignant neoplasm of prostate: Secondary | ICD-10-CM

## 2022-10-12 DIAGNOSIS — Z1211 Encounter for screening for malignant neoplasm of colon: Secondary | ICD-10-CM

## 2022-10-12 DIAGNOSIS — Z136 Encounter for screening for cardiovascular disorders: Secondary | ICD-10-CM

## 2022-10-12 DIAGNOSIS — Z0000 Encounter for general adult medical examination without abnormal findings: Secondary | ICD-10-CM

## 2022-10-12 DIAGNOSIS — E538 Deficiency of other specified B group vitamins: Secondary | ICD-10-CM

## 2022-10-12 DIAGNOSIS — R5383 Other fatigue: Secondary | ICD-10-CM

## 2022-10-12 DIAGNOSIS — I10 Essential (primary) hypertension: Secondary | ICD-10-CM | POA: Diagnosis not present

## 2022-10-12 DIAGNOSIS — E349 Endocrine disorder, unspecified: Secondary | ICD-10-CM

## 2022-10-12 DIAGNOSIS — E66813 Obesity, class 3: Secondary | ICD-10-CM

## 2022-10-12 DIAGNOSIS — E559 Vitamin D deficiency, unspecified: Secondary | ICD-10-CM

## 2022-10-12 DIAGNOSIS — E1122 Type 2 diabetes mellitus with diabetic chronic kidney disease: Secondary | ICD-10-CM

## 2022-10-12 DIAGNOSIS — E1169 Type 2 diabetes mellitus with other specified complication: Secondary | ICD-10-CM

## 2022-10-12 DIAGNOSIS — Z8249 Family history of ischemic heart disease and other diseases of the circulatory system: Secondary | ICD-10-CM

## 2022-10-12 LAB — CBC WITH DIFFERENTIAL/PLATELET
Basophils Relative: 1.2 %
Eosinophils Absolute: 373 cells/uL (ref 15–500)
HCT: 49.9 % (ref 38.5–50.0)
MCHC: 35.1 g/dL (ref 32.0–36.0)
MCV: 94.2 fL (ref 80.0–100.0)
MPV: 8.9 fL (ref 7.5–12.5)
Platelets: 459 10*3/uL — ABNORMAL HIGH (ref 140–400)
RBC: 5.3 10*6/uL (ref 4.20–5.80)

## 2022-10-12 MED ORDER — TOPIRAMATE 100 MG PO TABS: ORAL_TABLET | ORAL | 3 refills | Status: AC

## 2022-10-12 MED ORDER — PHENTERMINE HCL 37.5 MG PO TABS
ORAL_TABLET | ORAL | 1 refills | Status: DC
Start: 2022-10-12 — End: 2022-10-12

## 2022-10-12 MED ORDER — PHENTERMINE HCL 37.5 MG PO TABS: ORAL_TABLET | ORAL | 1 refills | Status: DC

## 2022-10-12 NOTE — Patient Instructions (Signed)
Due to recent changes in healthcare laws, you may see the results of your imaging and laboratory studies on MyChart before your provider has had a chance to review them.  We understand that in some cases there may be results that are confusing or concerning to you. Not all laboratory results come back in the same time frame and the provider may be waiting for multiple results in order to interpret others.  Please give us 48 hours in order for your provider to thoroughly review all the results before contacting the office for clarification of your results.   ++++++++++++++++++++++++++++++++++++++  Vit D  & Vit C 1,000 mg   are recommended to help protect  against the Covid-19 and other Corona viruses.    Also it's recommended  to take  Zinc 50 mg  to help  protect against the Covid-19   and best place to get  is also on [employer]  and don't pay more than 6-8 cents /pill !  =============================== Coronavirus (COVID-19) Are you at risk?  Are you at risk for the Coronavirus (COVID-19)?  To be considered HIGH RISK for Coronavirus (COVID-19), you have to meet the following criteria:  Traveled to [place], [place], [place], [place] or [place]; or in the [place] to [place], [place], [place]  or [place]; and have fever, cough, and shortness of breath within the last 2 weeks of travel OR Been in close contact with a person diagnosed with COVID-19 within the last 2 weeks and have  fever, cough,and shortness of breath  IF YOU DO NOT MEET THESE CRITERIA, YOU ARE CONSIDERED LOW RISK FOR COVID-19.  What to do if you are HIGH RISK for COVID-19?  If you are having a medical emergency, call 911. Seek medical care right away. Before you go to a doctor's office, urgent care or emergency department,  call ahead and tell them about your recent travel, contact with someone diagnosed with COVID-19   and your symptoms.  You should receive instructions from your physician's office  regarding next steps of care.  When you arrive at healthcare provider, tell the healthcare staff immediately you have returned from  visiting [place], [place], [place], [place] or [place]; or traveled in the [place] to [place], [place],  [place] or [place] in the last two weeks or you have been in close contact with a person diagnosed with  COVID-19 in the last 2 weeks.   Tell the health care staff about your symptoms: fever, cough and shortness of breath. After you have been seen by a medical provider, you will be either: Tested for (COVID-19) and discharged home on quarantine except to seek medical care if  symptoms worsen, and asked to  Stay home and avoid contact with others until you get your results (4-5 days)  Avoid travel on public transportation if possible (such as bus, train, or airplane) or Sent to the Emergency Department by EMS for evaluation, COVID-19 testing  and  possible admission depending on your condition and test results.  What to do if you are LOW RISK for COVID-19?  Reduce your risk of any infection by using the same precautions used for avoiding the common cold or flu:  Wash your hands often with soap and warm water for at least 20 seconds.  If soap and water are not readily available,  use an alcohol-based hand sanitizer with at least 60% alcohol.  If coughing or sneezing, cover your mouth and nose by coughing   or sneezing into the elbow areas of your shirt or coat,  into a tissue or into your sleeve (not your hands). Avoid shaking hands with others and consider head nods or verbal greetings only. Avoid touching your eyes, nose, or mouth with unwashed hands.  Avoid close contact with people who are sick. Avoid places or events with large numbers of people in one location, like concerts or sporting events. Carefully consider travel plans you have or are making. If you are planning any travel outside or inside the [place], visit the CDC's Travelers' Health  webpage for the latest health notices. If you have some symptoms but not all symptoms, continue to monitor at home and seek medical attention  if your symptoms worsen. If you are having a medical emergency, call 911. >>>>>>>>>>>>>>>>>>>>>>>>>>>> Preventive Care for Adults  A healthy lifestyle and preventive care can promote health and wellness. Preventive health guidelines for men include the following key practices: A routine yearly physical is a good way to check with your health care provider about your health and preventative screening. It is a chance to share any concerns and updates on your health and to receive a thorough exam. Visit your dentist for a routine exam and preventative care every 6 months. Brush your teeth twice a day and floss once a day. Good oral hygiene prevents tooth decay and gum disease. The frequency of eye exams is based on your age, health, family medical history, use of contact lenses, and other factors. Follow your health care provider's recommendations for frequency of eye exams. Eat a healthy diet. Foods such as vegetables, fruits, whole grains, low-fat dairy products, and lean protein foods contain the nutrients you need without too many calories. Decrease your intake of foods high in solid fats, added sugars, and salt. Eat the right amount of calories for you. Get information about a proper diet from your health care provider, if necessary. Regular physical exercise is one of the most important things you can do for your health. Most adults should get at least 150 minutes of moderate-intensity exercise (any activity that increases your heart rate and causes you to sweat) each week. In addition, most adults need muscle-strengthening exercises on 2 or more days a week. Maintain a healthy weight. The body mass index (BMI) is a screening tool to identify possible weight problems. It provides an estimate of body fat based on height and weight. Your health care provider can  find your BMI and can help you achieve or maintain a healthy weight. For adults 20 years and older: A BMI below 18.5 is considered underweight. A BMI of 18.5 to 24.9 is normal. A BMI of 25 to 29.9 is considered overweight. A BMI of 30 and above is considered obese. Maintain normal blood lipids and cholesterol levels by exercising and minimizing your intake of saturated fat. Eat a balanced diet with plenty of fruit and vegetables. Blood tests for lipids and cholesterol should begin at age 20 and be repeated every 5 years. If your lipid or cholesterol levels are high, you are over 50, or you are at high risk for heart disease, you may need your cholesterol levels checked more frequently. Ongoing high lipid and cholesterol levels should be treated with medicines if diet and exercise are not working. If you smoke, find out from your health care provider how to quit. If you do not use tobacco, do not start. Lung cancer screening is recommended for adults aged 55-80 years who are at high risk for   developing lung cancer because of a history of smoking. A yearly low-dose CT scan of the lungs is recommended for people who have at least a 30-pack-year history of smoking and are a current smoker or have quit within the past 15 years. A pack year of smoking is smoking an average of 1 pack of cigarettes a day for 1 year (for example: 1 pack a day for 30 years or 2 packs a day for 15 years). Yearly screening should continue until the smoker has stopped smoking for at least 15 years. Yearly screening should be stopped for people who develop a health problem that would prevent them from having lung cancer treatment. If you choose to drink alcohol, do not have more than 2 drinks per day. One drink is considered to be 12 ounces (355 mL) of beer, 5 ounces (148 mL) of wine, or 1.5 ounces (44 mL) of liquor. Avoid use of street drugs. Do not share needles with anyone. Ask for help if you need support or instructions about  stopping the use of drugs. High blood pressure causes heart disease and increases the risk of stroke. Your blood pressure should be checked at least every 1-2 years. Ongoing high blood pressure should be treated with medicines, if weight loss and exercise are not effective. If you are 45-79 years old, ask your health care provider if you should take aspirin to prevent heart disease. Diabetes screening involves taking a blood sample to check your fasting blood sugar level. This should be done once every 3 years, after age 45, if you are within normal weight and without risk factors for diabetes. Testing should be considered at a younger age or be carried out more frequently if you are overweight and have at least 1 risk factor for diabetes. Colorectal cancer can be detected and often prevented. Most routine colorectal cancer screening begins at the age of 50 and continues through age 75. However, your health care provider may recommend screening at an earlier age if you have risk factors for colon cancer. On a yearly basis, your health care provider may provide home test kits to check for hidden blood in the stool. Use of a small camera at the end of a tube to directly examine the colon (sigmoidoscopy or colonoscopy) can detect the earliest forms of colorectal cancer. Talk to your health care provider about this at age 50, when routine screening begins. Direct exam of the colon should be repeated every 5-10 years through age 75, unless early forms of precancerous polyps or small growths are found.  Talk with your health care provider about prostate cancer screening. Testicular cancer screening isrecommended for adult males. Screening includes self-exam, a health care provider exam, and other screening tests. Consult with your health care provider about any symptoms you have or any concerns you have about testicular cancer. Use sunscreen. Apply sunscreen liberally and repeatedly throughout the day. You should  seek shade when your shadow is shorter than you. Protect yourself by wearing long sleeves, pants, a wide-brimmed hat, and sunglasses year round, whenever you are outdoors. Once a month, do a whole-body skin exam, using a mirror to look at the skin on your back. Tell your health care provider about new moles, moles that have irregular borders, moles that are larger than a pencil eraser, or moles that have changed in shape or color. Stay current with required vaccines (immunizations). Influenza vaccine. All adults should be immunized every year. Tetanus, diphtheria, and acellular pertussis (Td, Tdap) vaccine. An   adult who has not previously received Tdap or who does not know his vaccine status should receive 1 dose of Tdap. This initial dose should be followed by tetanus and diphtheria toxoids (Td) booster doses every 10 years. Adults with an unknown or incomplete history of completing a 3-dose immunization series with Td-containing vaccines should begin or complete a primary immunization series including a Tdap dose. Adults should receive a Td booster every 10 years. Varicella vaccine. An adult without evidence of immunity to varicella should receive 2 doses or a second dose if he has previously received 1 dose. Human papillomavirus (HPV) vaccine. Males aged 13-21 years who have not received the vaccine previously should receive the 3-dose series. Males aged 22-26 years may be immunized. Immunization is recommended through the age of 26 years for any male who has sex with males and did not get any or all doses earlier. Immunization is recommended for any person with an immunocompromised condition through the age of 26 years if he did not get any or all doses earlier. During the 3-dose series, the second dose should be obtained 4-8 weeks after the first dose. The third dose should be obtained 24 weeks after the first dose and 16 weeks after the second dose. Zoster vaccine. One dose is recommended for adults  aged 60 years or older unless certain conditions are present.  PREVNAR  - Pneumococcal 13-valent conjugate (PCV13) vaccine. When indicated, a person who is uncertain of his immunization history and has no record of immunization should receive the PCV13 vaccine. An adult aged 19 years or older who has certain medical conditions and has not been previously immunized should receive 1 dose of PCV13 vaccine. This PCV13 should be followed with a dose of pneumococcal polysaccharide (PPSV23) vaccine. The PPSV23 vaccine dose should be obtained at least 1 r more year(s) after the dose of PCV13 vaccine. An adult aged 19 years or older who has certain medical conditions and previously received 1 or more doses of PPSV23 vaccine should receive 1 dose of PCV13. The PCV13 vaccine dose should be obtained 1 or more years after the last PPSV23 vaccine dose.  PNEUMOVAX - Pneumococcal polysaccharide (PPSV23) vaccine. When PCV13 is also indicated, PCV13 should be obtained first. All adults aged 65 years and older should be immunized. An adult younger than age 65 years who has certain medical conditions should be immunized. Any person who resides in a nursing home or long-term care facility should be immunized. An adult smoker should be immunized. People with an immunocompromised condition and certain other conditions should receive both PCV13 and PPSV23 vaccines. People with human immunodeficiency virus (HIV) infection should be immunized as soon as possible after diagnosis. Immunization during chemotherapy or radiation therapy should be avoided. Routine use of PPSV23 vaccine is not recommended for American Indians, [place] Natives, or people younger than 65 years unless there are medical conditions that require PPSV23 vaccine. When indicated, people who have unknown immunization and have no record of immunization should receive PPSV23 vaccine. One-time revaccination 5 years after the first dose of PPSV23 is recommended for people  aged 19-64 years who have chronic kidney failure, nephrotic syndrome, asplenia, or immunocompromised conditions. People who received 1-2 doses of PPSV23 before age 65 years should receive another dose of PPSV23 vaccine at age 65 years or later if at least 5 years have passed since the previous dose. Doses of PPSV23 are not needed for people immunized with PPSV23 at or after age 65 years.  Hepatitis A vaccine.   Adults who wish to be protected from this disease, have certain high-risk conditions, work with hepatitis A-infected animals, work in hepatitis A research labs, or travel to or work in countries with a high rate of hepatitis A should be immunized. Adults who were previously unvaccinated and who anticipate close contact with an international adoptee during the first 60 days after arrival in the [place] from a country with a high rate of hepatitis A should be immunized.  Hepatitis B vaccine. Adults should be immunized if they wish to be protected from this disease, have certain high-risk conditions, may be exposed to blood or other infectious body fluids, are household contacts or sex partners of hepatitis B positive people, are clients or workers in certain care facilities, or travel to or work in countries with a high rate of hepatitis B.  Preventive Service / Frequency  Ages 40 to 64 Blood pressure check. Lipid and cholesterol check Lung cancer screening. / Every year if you are aged 55-80 years and have a 30-pack-year history of smoking and currently smoke or have quit within the past 15 years. Yearly screening is stopped once you have quit smoking for at least 15 years or develop a health problem that would prevent you from having lung cancer treatment. Fecal occult blood test (FOBT) of stool. / Every year beginning at age 50 and continuing until age 75. You may not have to do this test if you get a colonoscopy every 10 years. Flexible sigmoidoscopy** or colonoscopy.** / Every 5 years for  a flexible sigmoidoscopy or every 10 years for a colonoscopy beginning at age 50 and continuing until age 75. Screening for abdominal aortic aneurysm (AAA)  by ultrasound is recommended for people who have history of high blood pressure or who are current or former smokers. +++++++++++ Recommend Adult Low Dose Aspirin or  coated  Aspirin 81 mg daily  To reduce risk of Colon Cancer 40 %,  Skin Cancer 26 % ,  Malignant Melanoma 46%  and  Pancreatic cancer 60% ++++++++++++++++++++ Vitamin D goal  is between 70-100.  Please make sure that you are taking your Vitamin D as directed.  It is very important as a natural anti-inflammatory  helping hair, skin, and nails, as well as reducing stroke and heart attack risk.  It helps your bones and helps with mood. It also decreases numerous cancer risks so please take it as directed.  Low Vit D is associated with a 200-300% higher risk for CANCER  and 200-300% higher risk for HEART   ATTACK  &  STROKE.   ...................................... It is also associated with higher death rate at younger ages,  autoimmune diseases like Rheumatoid arthritis, Lupus, Multiple Sclerosis.    Also many other serious conditions, like depression, Alzheimer's Dementia, infertility, muscle aches, fatigue, fibromyalgia - just to name a few. +++++++++++++++++++++ Recommend the book "The END of DIETING" by Dr Joel Fuhrman  & the book "The END of DIABETES " by Dr Joel Fuhrman At [employer] - get book & Audio CD's    Being diabetic has a  300% increased risk for heart attack, stroke, cancer, and alzheimer- type vascular dementia. It is very important that you work harder with diet by avoiding all foods that are white. Avoid white rice (brown & wild rice is OK), white potatoes (sweetpotatoes in moderation is OK), White bread or wheat bread or anything made out of white flour like bagels, donuts, rolls, buns, biscuits, cakes, pastries, cookies, pizza crust, and pasta (made    from white flour & egg whites) - vegetarian pasta or spinach or wheat pasta is OK. Multigrain breads like Arnold's or [employer], or multigrain sandwich thins or flatbreads.  Diet, exercise and weight loss can reverse and cure diabetes in the early stages.  Diet, exercise and weight loss is very important in the control and prevention of complications of diabetes which affects every system in your body, ie. Brain - dementia/stroke, eyes - glaucoma/blindness, heart - heart attack/heart failure, kidneys - dialysis, stomach - gastric paralysis, intestines - malabsorption, nerves - severe painful neuritis, circulation - gangrene & loss of a leg(s), and finally cancer and Alzheimers.    I recommend avoid fried & greasy foods,  sweets/candy, white rice (brown or wild rice or Quinoa is OK), white potatoes (sweet potatoes are OK) - anything made from white flour - bagels, doughnuts, rolls, buns, biscuits,white and wheat breads, pizza crust and traditional pasta made of white flour & egg white(vegetarian pasta or spinach or wheat pasta is OK).  Multi-grain bread is OK - like multi-grain flat bread or sandwich thins. Avoid alcohol in excess. Exercise is also important.    Eat all the vegetables you want - avoid meat, especially red meat and dairy - especially cheese.  Cheese is the most concentrated form of trans-fats which is the worst thing to clog up our arteries. Veggie cheese is OK which can be found in the fresh produce section at [employer] or [employer] or [employer]  ++++++++++++++++++++++ DASH Eating Plan  DASH stands for "Dietary Approaches to Stop Hypertension."   The DASH eating plan is a healthy eating plan that has been shown to reduce high blood pressure (hypertension). Additional health benefits may include reducing the risk of type 2 diabetes mellitus, heart disease, and stroke. The DASH eating plan may also help with weight loss. WHAT DO I NEED TO KNOW ABOUT THE DASH EATING PLAN? For  the DASH eating plan, you will follow these general guidelines: Choose foods with a percent daily value for sodium of less than 5% (as listed on the food label). Use salt-free seasonings or herbs instead of table salt or sea salt. Check with your health care provider or pharmacist before using salt substitutes. Eat lower-sodium products, often labeled as "lower sodium" or "no salt added." Eat fresh foods. Eat more vegetables, fruits, and low-fat dairy products. Choose whole grains. Look for the word "whole" as the first word in the ingredient list. Choose fish  Limit sweets, desserts, sugars, and sugary drinks. Choose heart-healthy fats. Eat veggie cheese  Eat more home-cooked food and less restaurant, buffet, and fast food. Limit fried foods. Cook foods using methods other than frying. Limit canned vegetables. If you do use them, rinse them well to decrease the sodium. When eating at a restaurant, ask that your food be prepared with less salt, or no salt if possible.                      WHAT FOODS CAN I EAT? Read Dr Joel Fuhrman's books on The End of Dieting & The End of Diabetes  Grains Whole grain or whole wheat bread. Brown rice. Whole grain or whole wheat pasta. Quinoa, bulgur, and whole grain cereals. Low-sodium cereals. Corn or whole wheat flour tortillas. Whole grain cornbread. Whole grain crackers. Low-sodium crackers.  Vegetables Fresh or frozen vegetables (raw, steamed, roasted, or grilled). Low-sodium or reduced-sodium tomato and vegetable juices. Low-sodium or reduced-sodium tomato sauce and paste. Low-sodium or reduced-sodium canned vegetables.     Fruits All fresh, canned (in natural juice), or frozen fruits.  Protein Products  All fish and seafood.  Dried beans, peas, or lentils. Unsalted nuts and seeds. Unsalted canned beans.  Dairy Low-fat dairy products, such as skim or 1% milk, 2% or reduced-fat cheeses, low-fat ricotta or cottage cheese, or plain low-fat yogurt.  Low-sodium or reduced-sodium cheeses.  Fats and Oils Tub margarines without trans fats. Light or reduced-fat mayonnaise and salad dressings (reduced sodium). Avocado. Safflower, olive, or canola oils. Natural peanut or almond butter.  Other Unsalted popcorn and pretzels. The items listed above may not be a complete list of recommended foods or beverages. Contact your dietitian for more options.  +++++++++++++++++++  WHAT FOODS ARE NOT RECOMMENDED? Grains/ White flour or wheat flour White bread. White pasta. White rice. Refined cornbread. Bagels and croissants. Crackers that contain trans fat.  Vegetables  Creamed or fried vegetables. Vegetables in a . Regular canned vegetables. Regular canned tomato sauce and paste. Regular tomato and vegetable juices.  Fruits Dried fruits. Canned fruit in light or heavy syrup. Fruit juice.  Meat and Other Protein Products Meat in general - RED meat & White meat.  Fatty cuts of meat. Ribs, chicken wings, all processed meats as bacon, sausage, bologna, salami, fatback, hot dogs, bratwurst and packaged luncheon meats.  Dairy Whole or 2% milk, cream, half-and-half, and cream cheese. Whole-fat or sweetened yogurt. Full-fat cheeses or blue cheese. Non-dairy creamers and whipped toppings. Processed cheese, cheese spreads, or cheese curds.  Condiments Onion and garlic salt, seasoned salt, table salt, and sea salt. Canned and packaged gravies. Worcestershire sauce. Tartar sauce. Barbecue sauce. Teriyaki sauce. Soy sauce, including reduced sodium. Steak sauce. Fish sauce. Oyster sauce. Cocktail sauce. Horseradish. Ketchup and mustard. Meat flavorings and tenderizers. Bouillon cubes. Hot sauce. Tabasco sauce. Marinades. Taco seasonings. Relishes.  Fats and Oils Butter, stick margarine, lard, shortening and bacon fat. Coconut, palm kernel, or palm oils. Regular salad dressings.  Pickles and olives. Salted popcorn and pretzels.  The items listed above may not  be a complete list of foods and beverages to avoid.   

## 2022-10-13 LAB — COMPLETE METABOLIC PANEL WITH GFR
AG Ratio: 1.8 (calc) (ref 1.0–2.5)
ALT: 34 U/L (ref 9–46)
AST: 21 U/L (ref 10–35)
Albumin: 4.2 g/dL (ref 3.6–5.1)
Alkaline phosphatase (APISO): 59 U/L (ref 35–144)
BUN: 8 mg/dL (ref 7–25)
CO2: 29 mmol/L (ref 20–32)
Calcium: 9.6 mg/dL (ref 8.6–10.3)
Chloride: 103 mmol/L (ref 98–110)
Creat: 1.16 mg/dL (ref 0.70–1.30)
Globulin: 2.3 g/dL (calc) (ref 1.9–3.7)
Glucose, Bld: 104 mg/dL — ABNORMAL HIGH (ref 65–99)
Potassium: 4.5 mmol/L (ref 3.5–5.3)
Sodium: 138 mmol/L (ref 135–146)
Total Bilirubin: 0.8 mg/dL (ref 0.2–1.2)
Total Protein: 6.5 g/dL (ref 6.1–8.1)
eGFR: 76 mL/min/{1.73_m2} (ref >=60)

## 2022-10-13 LAB — IRON, TOTAL/TOTAL IRON BINDING CAP
%SAT: 67 % (calc) — ABNORMAL HIGH (ref 20–48)
Iron: 229 ug/dL — ABNORMAL HIGH (ref 50–180)
TIBC: 342 mcg/dL (calc) (ref 250–425)

## 2022-10-13 LAB — CBC WITH DIFFERENTIAL/PLATELET
Absolute Monocytes: 1187 cells/uL — ABNORMAL HIGH (ref 200–950)
Basophils Absolute: 136 cells/uL (ref 0–200)
Eosinophils Relative: 3.3 %
Hemoglobin: 17.5 g/dL — ABNORMAL HIGH (ref 13.2–17.1)
Lymphs Abs: 2034 cells/uL (ref 850–3900)
MCH: 33 pg (ref 27.0–33.0)
Monocytes Relative: 10.5 %
Neutro Abs: 7571 cells/uL (ref 1500–7800)
Neutrophils Relative %: 67 %
RDW: 12.5 % (ref 11.0–15.0)
Total Lymphocyte: 18 %
WBC: 11.3 10*3/uL — ABNORMAL HIGH (ref 3.8–10.8)

## 2022-10-13 LAB — HEMOGLOBIN A1C
Hgb A1c MFr Bld: 7.2 % of total Hgb — ABNORMAL HIGH (ref <5.7)
Mean Plasma Glucose: 160 mg/dL
eAG (mmol/L): 8.9 mmol/L

## 2022-10-13 LAB — LIPID PANEL
Cholesterol: 135 mg/dL (ref <200)
HDL: 45 mg/dL (ref >=40)
LDL Cholesterol (Calc): 69 mg/dL (calc)
Non-HDL Cholesterol (Calc): 90 mg/dL (calc) (ref <130)
Total CHOL/HDL Ratio: 3 (calc) (ref <5.0)
Triglycerides: 125 mg/dL (ref <150)

## 2022-10-13 LAB — VITAMIN B12: Vitamin B-12: 267 pg/mL (ref 200–1100)

## 2022-10-13 LAB — TSH: TSH: 1.08 mIU/L (ref 0.40–4.50)

## 2022-10-13 LAB — PSA: PSA: 0.44 ng/mL (ref <=4.00)

## 2022-10-13 LAB — VITAMIN D 25 HYDROXY (VIT D DEFICIENCY, FRACTURES): Vit D, 25-Hydroxy: 63 ng/mL (ref 30–100)

## 2022-10-13 LAB — TESTOSTERONE: Testosterone: 622 ng/dL (ref 250–827)

## 2022-10-13 LAB — INSULIN, RANDOM: Insulin: 14.5 u[IU]/mL

## 2022-10-13 LAB — MAGNESIUM: Magnesium: 1.7 mg/dL (ref 1.5–2.5)

## 2022-10-14 NOTE — Progress Notes (Signed)
^<^<^<^<^<^<^<^<^<^<^<^<^<^<^<^<^<^<^<^<^<^<^<^<^<^<^<^<^<^<^<^<^<^<^<^<^ ^>^>^>^>^>^>^>^>^>^>^>>^>^>^>^>^>^>^>^>^>^>^>^>^>^>^>^>^>^>^>^>^>^>^>^>^>  -Test results slightly outside the reference range are not unusual. If there is anything important, I will review this with you,  otherwise it is considered normal test values.  If you have further questions,  please do not hesitate to contact me at the office or via My Chart.   ^<^<^<^<^<^<^<^<^<^<^<^<^<^<^<^<^<^<^<^<^<^<^<^<^<^<^<^<^<^<^<^<^<^<^<^<^ ^>^>^>^>^>^>^>^>^>^>^>^>^>^>^>^>^>^>^>^>^>^>^>^>^>^>^>^>^>^>^>^>^>^>^>^>^  -  Iron levels slightly elevated - will continue to monitor  - Please don't take ant Iron supplement pills   ^<^<^<^<^<^<^<^<^<^<^<^<^<^<^<^<^<^<^<^<^<^<^<^<^<^<^<^<^<^<^<^<^<^<^<^<^ ^>^>^>^>^>^>^>^>^>^>^>^>^>^>^>^>^>^>^>^>^>^>^>^>^>^>^>^>^>^>^>^>^>^>^>^>^  - Hgb  ( Red  Cell count) is getting too high again due to the testosterone,   - So suggest that you decrease the dose to 1/2 ml ( 100 mg )                                                                               every 2 weeks   (& not every week )   ^<^<^<^<^<^<^<^<^<^<^<^<^<^<^<^<^<^<^<^<^<^<^<^<^<^<^<^<^<^<^<^<^<^<^<^<^ ^>^>^>^>^>^>^>^>^>^>^>^>^>^>^>^>^>^>^>^>^>^>^>^>^>^>^>^>^>^>^>^>^>^>^>^>^  - A1c = 7.2% - still to high in Diabetic range     Being diabetic has a  300% increased risk for heart attack,                                               stroke, cancer, and alzheimer- type vascular dementia.   It is very important that you work harder with diet by                              avoiding all foods that are white except chicken, fish & calliflower.  - Avoid white rice  (brown & wild rice is OK),   - Avoid white potatoes  (sweet potatoes in moderation is OK),   White bread or wheat bread or anything made out of   white flour like bagels, donuts, rolls, buns, biscuits, cakes,  - pastries, cookies, pizza crust, and pasta (made from white  flour & egg whites)   - vegetarian pasta or spinach or wheat pasta is OK.  - Multigrain breads like Arnold's, Pepperidge Farm or                                                 multigrain sandwich thins or high fiber breads like   Eureka bread or "Dave's Killer" breads that are 4 to 5 grams fiber per slice !  are best.    Diet, exercise and weight loss can reverse and cure diabetes in the early stages.    - Diet, exercise and weight loss is very important in the   control and prevention of complications of diabetes which   affects every system in your body, ie.   -Brain - dementia/stroke,  - eyes - glaucoma/blindness,  - heart - heart attack/heart failure,  - kidneys - dialysis,  - stomach - gastric paralysis,  - intestines - malabsorption,  - nerves - severe painful neuritis,  - circulation - gangrene &  loss of a leg(s)  - and finally  . . . . . . . . . . . . . . . . . .    - cancer and Alzheimers.  ^<^<^<^<^<^<^<^<^<^<^<^<^<^<^<^<^<^<^<^<^<^<^<^<^<^<^<^<^<^<^<^<^<^<^<^<^ ^>^>^>^>^>^>^>^>^>^>^>^>^>^>^>^>^>^>^>^>^>^>^>^>^>^>^>^>^>^>^>^>^>^>^>^>^   -  Vitamin B12 =   267    is    Very Low                                    (Ideal or Goal Vit B12 is between 450 - 1,100)   - Vit B12 may be associated with Anemia, Fatigue,   Peripheral Neuropathy, Dementia, "Brain Fog", & Depression   - Recommend take a sub-lingual form of Vitamin B12 tablet   1,000 to 5,000 mcg tab that you dissolve under your tongue /Daily   - Can get Lavonia Dana - best price at [employer] or on [employer] ^<^<^<^<^<^<^<^<^<^<^<^<^<^<^<^<^<^<^<^<^<^<^<^<^<^<^<^<^<^<^<^<^<^<^<^<^ ^>^>^>^>^>^>^>^>^>^>^>^>^>^>^>^>^>^>^>^>^>^>^>^>^>^>^>^>^>^>^>^>^>^>^>^>^  - PSA - very Low - No Prostate Cancer - Great !   ^<^<^<^<^<^<^<^<^<^<^<^<^<^<^<^<^<^<^<^<^<^<^<^<^<^<^<^<^<^<^<^<^<^<^<^<^ ^>^>^>^>^>^>^>^>^>^>^>^>^>^>^>^>^>^>^>^>^>^>^>^>^>^>^>^>^>^>^>^>^>^>^>^>^  - Testosterone - higher end of normal , So recommend  that                                                                                         you decrease  dosing as above.   ^<^<^<^<^<^<^<^<^<^<^<^<^<^<^<^<^<^<^<^<^<^<^<^<^<^<^<^<^<^<^<^<^<^<^<^<^ ^>^>^>^>^>^>^>^>^>^>^>^>^>^>^>^>^>^>^>^>^>^>^>^>^>^>^>^>^>^>^>^>^>^>^>^>^  -  Magnesium  = 1.7  is very  low- goal is betw 2.0 - 2.5,   - So..............Marland Kitchen  Recommend that you take  Magnesium 500 mg tablet daily   - also important to eat lots of  leafy green vegetables   - spinach - Kale - collards - greens - okra - asparagus - broccoli - quinoa - squash   - almonds,    black, red, white beans   -  peas - green beans  ^<^<^<^<^<^<^<^<^<^<^<^<^<^<^<^<^<^<^<^<^<^<^<^<^<^<^<^<^<^<^<^<^<^<^<^<^ ^>^>^>^>^>^>^>^>^>^>^>^>^>^>^>^>^>^>^>^>^>^>^>^>^>^>^>^>^>^>^>^>^>^>^>^>^  - Vitamin D = 63 - Great - Please continue dosing same   ^<^<^<^<^<^<^<^<^<^<^<^<^<^<^<^<^<^<^<^<^<^<^<^<^<^<^<^<^<^<^<^<^<^<^<^<^ ^>^>^>^>^>^>^>^>^>^>^>^>^>^>^>^>^>^>^>^>^>^>^>^>^>^>^>^>^>^>^>^>^>^>^>^>^  -Chol = 135 -  Excellent   - Very low risk for Heart Attack  / Stroke  ^>^>^>^>^>^>^>^>^>^>^>^>^>^>^>^>^>^>^>^>^>^>^>^>^>^>^>^>^>^>^>^>^>^>^>^>^ ^>^>^>^>^>^>^>^>^>^>^>^>^>^>^>^>^>^>^>^>^>^>^>^>^>^>^>^>^>^>^>^>^>^>^>^>^  -  All Else - CBC - Kidneys - Electrolytes - Liver - Magnesium & Thyroid    - all  Normal / OK  ^<^<^<^<^<^<^<^<^<^<^<^<^<^<^<^<^<^<^<^<^<^<^<^<^<^<^<^<^<^<^<^<^<^<^<^<^ ^>^>^>^>^>^>^>^>^>^>^>^>^>^>^>^>^>^>^>^>^>^>^>^>^>^>^>^>^>^>^>^>^>^>^>^>^  -

## 2022-11-12 ENCOUNTER — Ambulatory Visit: Payer: Commercial Managed Care - PPO | Admitting: Nurse Practitioner

## 2022-11-13 ENCOUNTER — Other Ambulatory Visit: Payer: Self-pay | Admitting: Internal Medicine

## 2022-11-13 DIAGNOSIS — E119 Type 2 diabetes mellitus without complications: Secondary | ICD-10-CM

## 2022-11-13 MED ORDER — TIRZEPATIDE 15 MG/0.5ML ~~LOC~~ SOAJ
15.0000 mg | SUBCUTANEOUS | 3 refills | Status: AC
  Filled 2023-03-09 – 2023-03-30 (×2): qty 2, 28d supply, fill #0
  Filled 2023-04-23: qty 2, 28d supply, fill #1

## 2022-11-13 MED ORDER — METFORMIN HCL ER 500 MG PO TB24
ORAL_TABLET | ORAL | 3 refills | Status: AC
Start: 2022-11-13 — End: ?

## 2022-11-21 ENCOUNTER — Other Ambulatory Visit: Payer: Self-pay | Admitting: Internal Medicine

## 2022-11-21 DIAGNOSIS — N529 Male erectile dysfunction, unspecified: Secondary | ICD-10-CM

## 2022-11-21 MED ORDER — TADALAFIL 20 MG PO TABS
ORAL_TABLET | ORAL | 2 refills | Status: AC
Start: 2022-11-21 — End: ?

## 2022-11-21 NOTE — Progress Notes (Unsigned)
Assessment and Plan: Noah Dennis was seen today for procedure.  Diagnoses and all orders for this visit:  Essential hypertension - continue medications, DASH diet, exercise and monitor at home. Call if greater than 130/80.   Screening for skin cancer -     Ambulatory referral to Dermatology  Skin tags, multiple acquired Instructed to control blood sugars- work on diet and exercise Procedure: Skin tag removal Informed consent:  Discussed risks (permanent scarring, infection, pain, bleeding, bruising, redness, and recurrence of the lesion) and benefits of the procedure, as well as the alternatives.  He is aware that skin tags are benign lesions, and their removal is often not considered medically necessary.  Informed consent was obtained. Anesthesia: 1% lidocaine  The area was prepared and draped in a standard fashion. Snip removal was performed.   Antibiotic ointment and a sterile dressing were applied.   The patient tolerated procedure well. The patient was instructed on post-op care.   Number of lesions removed:  5     Type 2 Diabetes Mellitus with CKD stage 2(HCC) Continue medications: Metformin 500 mg 2 tabs BID and Mounjaro 15 mg SQ QW Continue diet and exercise.  Perform daily foot/skin check, notify office of any concerning changes.     Further disposition pending results of labs. Discussed med's effects and SE's.   Over 30 minutes of exam, counseling, chart review, and critical decision making was performed.   Future Appointments  Date Time Provider Department Center  04/06/2023  2:30 PM Raynelle Dick, NP [facility] None  10/20/2023  2:00 PM [name], MD [facility] None    ------------------------------------------------------------------------------------------------------------------   HPI BP 122/80   Pulse 86   Temp (!) 97.5 F (36.4 C)   Ht 5' 9.5" (1.765 m)   Wt 223 lb 12.8 oz (101.5 kg)   SpO2 96%   BMI 32.58 kg/m   52 y.o.male presents for skin  tag removal. He has noticed multiple skin tags around his neck which are becoming more pronounced. The areas are getting caught on his shirt collar and he wants them removed   BP well controlled without medication BP Readings from Last 3 Encounters:  11/23/22 122/80  10/12/22 116/80  05/13/22 118/70  Denies headaches, chest pain, shortness of breath and dizziness    BMI is Body mass index is 32.58 kg/m., he has been working on diet and exercise. He is currently on Phentermine 37.5 mg every day and topamax 100 mg every day  Wt Readings from Last 3 Encounters:  11/23/22 223 lb 12.8 oz (101.5 kg)  10/12/22 225 lb (102.1 kg)  05/13/22 217 lb 9.6 oz (98.7 kg)   He is currently on Metformin 500 mg 2 tabs BID and Mounjaro 15 mg  SQ QW for Type 2 Diabetes Mellitus. 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   doxycycline (VIBRAMYCIN) 100 MG capsule TAKE 1 CAPSULE BY MOUTH DAILY FOR SKIN INFECTION   finasteride (PROSCAR) 5 MG tablet TAKE 1 TABLET BY MOUTH DAILY FOR PROSTATE   metFORMIN (GLUCOPHAGE-XR) 500 MG  24 hr tablet Take  2 tablets  2 x /day with Meals for Diabetes                                                                   /                                                                   TAKE                                         BY                                                 MOUTH   pantoprazole (PROTONIX) 40 MG tablet Take  1 to 2  tablets  Daily  for Indigestion & Acid Reflux   phentermine (ADIPEX-P) 37.5 MG tablet Take  1 tablet  every Morning for Dieting & Weight Loss                                                                                                                  /                    TAKE                    BY                 MOUTH   rosuvastatin (CRESTOR) 40 MG tablet Take 1 tablet (40 mg total) by mouth daily.   tadalafil (CIALIS) 20 MG tablet Take  1/2 to 1 tablet  every 2 to 3 days  as needed for XXXX                                                       /  TAKE                                         BY                                                 MOUTH   testosterone cypionate (DEPOTESTOSTERONE CYPIONATE) 200 MG/ML injection Inject 1/2 ml (100 mg) into the Muscle  each Week   tirzepatide (MOUNJARO) 15 MG/0.5ML Pen Inject 15 mg into the skin once a week. for Diabetes ( Dx:  e11.29)   topiramate (TOPAMAX) 100 MG tablet Take 1/2 to 1 tablet every Night for Dieting & Weight Loss                                                                            /                                                                   TAKE                                         BY                                                 MOUTH   Zinc 50 MG CAPS Take by mouth daily.   No current facility-administered medications on file prior to visit.    ROS: all negative except above.   Physical Exam:  BP 122/80   Pulse 86   Temp (!) 97.5 F (36.4 C)   Ht 5' 9.5" (1.765 m)   Wt 223 lb 12.8 oz (101.5 kg)   SpO2 96%   BMI 32.58 kg/m   General Appearance: Well nourished, in no apparent distress. Eyes: PERRLA, EOMs, conjunctiva no swelling or erythema Neck: Supple, thyroid normal.  Respiratory: Respiratory effort normal, BS equal bilaterally without rales, rhonchi, wheezing or stridor.  Cardio: RRR with no MRGs. Brisk peripheral pulses without edema.  Abdomen: Soft, + BS.  Non tender, no guarding, rebound, hernias, masses. Lymphatics: Non tender without lymphadenopathy.  Musculoskeletal: Full  ROM, 5/5 strength, normal gait.  Skin: Warm, dry . Some erythema of neck , multiple small skin tags of neck. Procedure: Skin tag removal Informed consent:  Discussed risks (permanent scarring, infection, pain, bleeding, bruising, redness, and recurrence of the lesion) and benefits of the procedure, as well as the alternatives.  He is aware that skin tags are benign lesions, and their removal is often not considered medically necessary.  Informed  consent was obtained. Anesthesia: 1% lidocaine  The area was prepared and draped in a standard fashion. Snip removal was performed.   Antibiotic ointment and a sterile dressing were applied.   The patient tolerated procedure well. The patient was instructed on post-op care.   Number of lesions removed:  5   Neuro: Cranial nerves intact. Normal muscle tone, no cerebellar symptoms. Sensation intact.  Psych: Awake and oriented X 3, normal affect, Insight and Judgment appropriate.     Raynelle Dick, NP 9:18 AM [facility]

## 2022-11-23 ENCOUNTER — Ambulatory Visit: Payer: Commercial Managed Care - PPO | Admitting: Nurse Practitioner

## 2022-11-23 ENCOUNTER — Encounter: Payer: Self-pay | Admitting: Nurse Practitioner

## 2022-11-23 VITALS — BP 122/80 | HR 86 | Temp 97.5°F | Ht 69.5 in | Wt 223.8 lb

## 2022-11-23 DIAGNOSIS — N182 Chronic kidney disease, stage 2 (mild): Secondary | ICD-10-CM

## 2022-11-23 DIAGNOSIS — L918 Other hypertrophic disorders of the skin: Secondary | ICD-10-CM | POA: Diagnosis not present

## 2022-11-23 DIAGNOSIS — Z1283 Encounter for screening for malignant neoplasm of skin: Secondary | ICD-10-CM | POA: Diagnosis not present

## 2022-11-23 DIAGNOSIS — I10 Essential (primary) hypertension: Secondary | ICD-10-CM | POA: Diagnosis not present

## 2022-11-23 DIAGNOSIS — E1122 Type 2 diabetes mellitus with diabetic chronic kidney disease: Secondary | ICD-10-CM | POA: Diagnosis not present

## 2022-11-23 NOTE — Patient Instructions (Signed)
Skin Tag, Adult A skin tag (acrochordon) is a soft, extra growth of skin. Most skin tags are skin-colored and rarely bigger than a pencil eraser. They often form in areas where there is frequent rubbing, or friction, on the skin. This may be where there are folds in the skin, such as: The eyelids. The neck. The armpits. The groin. Skin tags are not dangerous, and they do not spread from person to person (are not contagious). You may have one skin tag or many. Skin tags do not need treatment. However, your health care provider may recommend removing a skin tag if it: Gets irritated from clothing or jewelry. Bleeds. Is visible and unsightly. What are the causes? This condition is linked to: Increasing age. Pregnancy. Diabetes. Obesity. What are the signs or symptoms? Skin tags usually do not cause symptoms unless they get irritated by items touching your skin, such as clothing or jewelry. When this happens, you may have pain, itching, or bleeding. How is this diagnosed? This condition is diagnosed with an evaluation from your health care provider. No testing is needed for diagnosis. How is this treated? Treatment for this condition depends on whether you have symptoms. Your health care provider may also remove your skin tag if it is visible or if you do not like the way it looks. A skin tag can be removed by a health care provider with: A simple surgical procedure using scissors. A procedure that involves freezing your skin tag with a gas in liquid form (liquid nitrogen). A procedure that uses heat to destroy your skin tag (electrodessication). Follow these instructions at home: Watch for any changes in your skin tag. A normal skin tag does not require any other special care at home. Take over-the-counter and prescription medicines only as told by your health care provider. Keep all follow-up visits. Contact a health care provider if: You have a skin tag that: Becomes painful. Changes  color. Bleeds. Swells. Summary Skin tags are soft, extra growths of skin found in areas of frequent rubbing or friction. Skin tags usually do not cause symptoms. If symptoms occur, you may have pain, itching, or bleeding. Your health care provider may remove your skin tag if it causes symptoms or if you do not like the way it looks. This information is not intended to replace advice given to you by your health care provider. Make sure you discuss any questions you have with your health care provider. Document Revised: 05/28/2021 Document Reviewed: 05/28/2021 Elsevier Patient Education  2024 [employer].

## 2022-12-11 ENCOUNTER — Other Ambulatory Visit: Payer: Self-pay

## 2022-12-11 DIAGNOSIS — E1169 Type 2 diabetes mellitus with other specified complication: Secondary | ICD-10-CM

## 2022-12-11 MED ORDER — ROSUVASTATIN CALCIUM 40 MG PO TABS
40.0000 mg | ORAL_TABLET | Freq: Every day | ORAL | 1 refills | Status: DC
Start: 2022-12-11 — End: 2023-06-01

## 2023-01-07 ENCOUNTER — Other Ambulatory Visit: Payer: Self-pay | Admitting: Nurse Practitioner

## 2023-01-07 DIAGNOSIS — L719 Rosacea, unspecified: Secondary | ICD-10-CM

## 2023-03-02 ENCOUNTER — Ambulatory Visit: Payer: Commercial Managed Care - PPO | Admitting: Nurse Practitioner

## 2023-03-02 ENCOUNTER — Ambulatory Visit
Admission: RE | Admit: 2023-03-02 | Discharge: 2023-03-02 | Disposition: A | Discharge status: Home / Self Care | Payer: Commercial Managed Care - PPO | Source: Ambulatory Visit | Attending: Nurse Practitioner | Admitting: Nurse Practitioner

## 2023-03-02 ENCOUNTER — Encounter: Payer: Self-pay | Admitting: Nurse Practitioner

## 2023-03-02 ENCOUNTER — Encounter: Payer: Self-pay | Admitting: Hematology & Oncology

## 2023-03-02 VITALS — BP 136/88 | HR 91 | Temp 97.5°F | Ht 69.5 in | Wt 213.0 lb

## 2023-03-02 DIAGNOSIS — W19XXXA Unspecified fall, initial encounter: Secondary | ICD-10-CM

## 2023-03-02 DIAGNOSIS — I10 Essential (primary) hypertension: Secondary | ICD-10-CM

## 2023-03-02 DIAGNOSIS — Z789 Other specified health status: Secondary | ICD-10-CM

## 2023-03-02 DIAGNOSIS — M25561 Pain in right knee: Secondary | ICD-10-CM

## 2023-03-02 NOTE — Patient Instructions (Signed)
Acute Knee Pain, Adult Many things can cause knee pain. Sometimes, knee pain is sudden (acute) and may be caused by damage, swelling, or irritation of the muscles and tissues that support your knee. The pain often goes away on its own with time and rest. If the pain does not go away, tests may be done to find out what is causing the pain. Follow these instructions at home: If you have a knee sleeve or brace:  Wear the knee sleeve or brace as told by your doctor. Take it off only as told by your doctor. Loosen it if your toes: Tingle. Become numb. Turn cold and blue. Keep it clean. If the knee sleeve or brace is not waterproof: Do not let it get wet. Cover it with a watertight covering when you take a bath or shower. Activity Rest your knee. Do not do things that cause pain or make pain worse. Avoid activities where both feet leave the ground at the same time (high-impact activities). Examples are running, jumping rope, and doing jumping jacks. Work with a physical therapist to make a safe exercise program, as told by your doctor. Managing pain, stiffness, and swelling  If told, put ice on the knee. To do this: If you have a removable knee sleeve or brace, take it off as told by your doctor. Put ice in a plastic bag. Place a towel between your skin and the bag. Leave the ice on for 20 minutes, 2-3 times a day. Take off the ice if your skin turns bright red. This is very important. If you cannot feel pain, heat, or cold, you have a greater risk of damage to the area. If told, use an elastic bandage to put pressure (compression) on your injured knee. Raise your knee above the level of your heart while you are sitting or lying down. Sleep with a pillow under your knee. General instructions Take over-the-counter and prescription medicines only as told by your doctor. Do not smoke or use any products that contain nicotine or tobacco. If you need help quitting, ask your doctor. If you are  overweight, work with your doctor and a food expert (dietitian) to set goals to lose weight. Being overweight can make your knee hurt more. Watch for any changes in your symptoms. Keep all follow-up visits. Contact a doctor if: The knee pain does not stop. The knee pain changes or gets worse. You have a fever along with knee pain. Your knee is red or feels warm when you touch it. Your knee gives out or locks up. Get help right away if: Your knee swells, and the swelling gets worse. You cannot move your knee. You have very bad knee pain that does not get better with pain medicine. Summary Many things can cause knee pain. The pain often goes away on its own with time and rest. Your doctor may do tests to find out the cause of the pain. Watch for any changes in your symptoms. Relieve your pain with rest, medicines, light activity, and use of ice. Get help right away if you cannot move your knee or your knee pain is very bad. This information is not intended to replace advice given to you by your health care provider. Make sure you discuss any questions you have with your health care provider. Document Revised: 09/26/2019 Document Reviewed: 09/27/2019 Elsevier Patient Education  2024 [employer]  

## 2023-03-02 NOTE — Progress Notes (Signed)
Assessment and Plan:  [place] was seen today for an episodic visit.  Diagnoses and all order for this visit   Fall, initial encounter/Alcohol use Refrain from drinking alcohol to  the level of intoxication increasing risk for injury.  - DG Knee Complete 4 Views Right; Future  Acute pain of right knee RICE Method Brace support PRN OTC anti-inflammatory or analgesic PRN Defers oral steroids tmt prefers steroid injection. Refer to [facility] if s/s fail to improve for evaluation of steroid injection.  - DG Knee Complete 4 Views Right; Future  Essential hypertension Controlled Discussed DASH (Dietary Approaches to Stop Hypertension) DASH diet is lower in sodium than a typical American diet. Cut back on foods that are high in saturated fat, cholesterol, and trans fats. Eat more whole-grain foods, fish, poultry, and nuts Remain active and exercise as tolerated daily.  Monitor BP at home-Call if greater than 130/80.  Check CMP/CBC   Notify office for further evaluation and treatment, questions or concerns if s/s fail to improve. The risks and benefits of my recommendations, as well as other treatment options were discussed with the patient today. Questions were answered.  Further disposition pending results of labs. Discussed med's effects and SE's.    Over 20 minutes of exam, counseling, chart review, and critical decision making was performed.   Future Appointments  Date Time Provider Department Center  04/06/2023  2:30 PM Raynelle Dick, NP [facility] None  10/28/2023  2:00 PM [name], MD [facility] None    ------------------------------------------------------------------------------------------------------------------   HPI BP 136/88   Pulse 91   Temp (!) 97.5 F (36.4 C)   Ht 5' 9.5" (1.765 m)   Wt 213 lb (96.6 kg)   SpO2 99%   BMI 31.00 kg/m   [place] presents for evaluation of right knee pain after sustaining a fall 2 months ago.  States  that he was in [place] drinking alcohol and drunk when he fell and histhis knee.  He does not remember what part of the knee he hit.  Afterwards he reports a large hematoma to the medial part of his knee.  He did not implement any treatment after the fall including RICE method, brace, anti-inflammatory.  The hematoma has no subsided.  He denies swelling.  He continues to have discomfort when walking or bending the knee.  The pain is centralized behind the patella "inside the knee joint."  He denies any fever, chills, past injury to the area.  Denies buckling or crepitous, popping or clicking, weakness.    Past Medical History:  Diagnosis Date   Erythrocytosis due to endocrine disorders 10/25/2018   Hyperlipidemia    Hypertension    Hypogonadism male    Insulin resistance    Pre-diabetes      No Known Allergies  Current Outpatient Medications on File Prior to Visit  Medication Sig   Cholecalciferol (VITAMIN D-3) 5000 units TABS Take 5,000 Units by mouth 2 (two) times daily.   doxycycline (VIBRAMYCIN) 100 MG capsule Take  1 capsule  Daily  with Food for Skin Infection                                               /  TAKE                                         BY                                                 MOUTH   finasteride (PROSCAR) 5 MG tablet TAKE 1 TABLET BY MOUTH DAILY FOR PROSTATE   metFORMIN (GLUCOPHAGE-XR) 500 MG 24 hr tablet Take  2 tablets  2 x /day with Meals for Diabetes                                                                   /                                                                   TAKE                                         BY                                                 MOUTH   pantoprazole (PROTONIX) 40 MG tablet Take  1 to 2  tablets  Daily  for Indigestion & Acid Reflux   phentermine (ADIPEX-P) 37.5 MG tablet Take  1 tablet  every Morning for Dieting & Weight Loss                                                                                                                  /                    TAKE                    BY                 MOUTH   rosuvastatin (CRESTOR) 40 MG tablet Take 1 tablet (40 mg total) by mouth daily.   tadalafil (CIALIS) 20 MG tablet Take  1/2 to 1 tablet  every 2 to 3 days  as  needed for XXXX                                                       /                                                                   TAKE                                         BY                                                 MOUTH   testosterone cypionate (DEPOTESTOSTERONE CYPIONATE) 200 MG/ML injection Inject 1/2 ml (100 mg) into the Muscle  each Week   tirzepatide (MOUNJARO) 15 MG/0.5ML Pen Inject 15 mg into the skin once a week. for Diabetes ( Dx:  e11.29)   topiramate (TOPAMAX) 100 MG tablet Take 1/2 to 1 tablet every Night for Dieting & Weight Loss                                                                            /                                                                   TAKE                                         BY                                                 MOUTH   Zinc 50 MG CAPS Take by mouth daily.   No current facility-administered medications on file prior to visit.    ROS: all negative except what is noted in the HPI.   Physical Exam:  BP 136/88   Pulse 91   Temp (!) 97.5 F (36.4 C)   Ht 5' 9.5" (1.765 m)   Wt 213 lb (96.6 kg)   SpO2 99%   BMI 31.00 kg/m   General Appearance: NAD.  Awake, conversant and cooperative. Eyes: PERRLA, EOMs intact.  Sclera white.  Conjunctiva without erythema. Sinuses: No frontal/maxillary tenderness.  No nasal discharge. Nares patent.  ENT/Mouth: Ext aud canals clear.  Bilateral TMs w/DOL and without erythema or bulging. Hearing intact.  Posterior pharynx without swelling or exudate.  Tonsils without swelling or erythema.  Neck: Supple.  No masses, nodules or thyromegaly. Respiratory: Effort is  regular with non-labored breathing. Breath sounds are equal bilaterally without rales, rhonchi, wheezing or stridor.  Cardio: RRR with no MRGs. Brisk peripheral pulses without edema.  Abdomen: Active BS in all four quadrants.  Soft and non-tender without guarding, rebound tenderness, hernias or masses. Lymphatics: Non tender without lymphadenopathy.  Musculoskeletal: Full ROM, 5/5 strength, normal ambulation.  No clubbing or cyanosis. Skin: Appropriate color for ethnicity. Warm without rashes, lesions, ecchymosis, ulcers.  Neuro: CN II-XII grossly normal. Normal muscle tone without cerebellar symptoms and intact sensation.   Psych: AO X 3,  appropriate mood and affect, insight and judgment.     Adela Glimpse, NP 11:44 AM [facility]

## 2023-03-09 ENCOUNTER — Other Ambulatory Visit (HOSPITAL_COMMUNITY): Payer: Self-pay

## 2023-03-09 ENCOUNTER — Encounter: Payer: Self-pay | Admitting: Hematology & Oncology

## 2023-03-30 ENCOUNTER — Encounter: Payer: Self-pay | Admitting: Hematology & Oncology

## 2023-03-30 ENCOUNTER — Other Ambulatory Visit (HOSPITAL_BASED_OUTPATIENT_CLINIC_OR_DEPARTMENT_OTHER): Payer: Self-pay

## 2023-03-31 LAB — HM DIABETES EYE EXAM

## 2023-04-06 ENCOUNTER — Ambulatory Visit: Payer: Commercial Managed Care - PPO | Admitting: Nurse Practitioner

## 2023-04-08 ENCOUNTER — Encounter: Payer: Self-pay | Admitting: Internal Medicine

## 2023-04-19 ENCOUNTER — Other Ambulatory Visit: Payer: Self-pay | Admitting: Internal Medicine

## 2023-04-19 DIAGNOSIS — M25561 Pain in right knee: Secondary | ICD-10-CM

## 2023-04-19 MED ORDER — MELOXICAM 15 MG PO TABS
ORAL_TABLET | ORAL | 0 refills | Status: AC
Start: 2023-04-19 — End: ?

## 2023-04-23 ENCOUNTER — Other Ambulatory Visit (HOSPITAL_BASED_OUTPATIENT_CLINIC_OR_DEPARTMENT_OTHER): Payer: Self-pay

## 2023-05-22 ENCOUNTER — Other Ambulatory Visit: Payer: Self-pay | Admitting: Internal Medicine

## 2023-06-01 ENCOUNTER — Other Ambulatory Visit: Payer: Self-pay | Admitting: Internal Medicine

## 2023-06-01 DIAGNOSIS — E1169 Type 2 diabetes mellitus with other specified complication: Secondary | ICD-10-CM

## 2023-06-10 ENCOUNTER — Other Ambulatory Visit: Payer: Self-pay

## 2023-06-10 MED ORDER — FINASTERIDE 5 MG PO TABS: ORAL_TABLET | ORAL | 0 refills | Status: AC

## 2023-10-01 ENCOUNTER — Encounter: Payer: Self-pay | Admitting: Hematology & Oncology

## 2023-10-01 ENCOUNTER — Ambulatory Visit
Admission: RE | Admit: 2023-10-01 | Discharge: 2023-10-01 | Disposition: A | Discharge status: Home / Self Care | Source: Ambulatory Visit | Attending: Nurse Practitioner | Admitting: Nurse Practitioner

## 2023-10-01 ENCOUNTER — Other Ambulatory Visit: Payer: Self-pay | Admitting: Nurse Practitioner

## 2023-10-01 DIAGNOSIS — M79642 Pain in left hand: Secondary | ICD-10-CM

## 2023-10-20 ENCOUNTER — Encounter: Payer: Commercial Managed Care - PPO | Admitting: Internal Medicine

## 2023-10-28 ENCOUNTER — Encounter: Payer: Commercial Managed Care - PPO | Admitting: Internal Medicine
